# Patient Record
Sex: Male | Born: 1940 | Race: White | Hispanic: No | Marital: Married | Smoking: Former smoker
Health system: Southern US, Community
[De-identification: ages and names within clinical notes are randomized; demographics above are authoritative.]

## PROBLEM LIST (undated history)

## (undated) DIAGNOSIS — E785 Hyperlipidemia, unspecified: Secondary | ICD-10-CM

## (undated) DIAGNOSIS — C4490 Unspecified malignant neoplasm of skin, unspecified: Secondary | ICD-10-CM

## (undated) DIAGNOSIS — H3530 Unspecified macular degeneration: Secondary | ICD-10-CM

## (undated) DIAGNOSIS — I10 Essential (primary) hypertension: Secondary | ICD-10-CM

## (undated) DIAGNOSIS — N400 Enlarged prostate without lower urinary tract symptoms: Secondary | ICD-10-CM

## (undated) DIAGNOSIS — I2510 Atherosclerotic heart disease of native coronary artery without angina pectoris: Secondary | ICD-10-CM

## (undated) DIAGNOSIS — C3490 Malignant neoplasm of unspecified part of unspecified bronchus or lung: Secondary | ICD-10-CM

## (undated) DIAGNOSIS — I6529 Occlusion and stenosis of unspecified carotid artery: Secondary | ICD-10-CM

## (undated) DIAGNOSIS — Z923 Personal history of irradiation: Secondary | ICD-10-CM

## (undated) DIAGNOSIS — K5790 Diverticulosis of intestine, part unspecified, without perforation or abscess without bleeding: Secondary | ICD-10-CM

## (undated) HISTORY — DX: Malignant neoplasm of unspecified part of unspecified bronchus or lung: C34.90

## (undated) HISTORY — DX: Occlusion and stenosis of unspecified carotid artery: I65.29

## (undated) HISTORY — DX: Atherosclerotic heart disease of native coronary artery without angina pectoris: I25.10

## (undated) HISTORY — DX: Essential (primary) hypertension: I10

## (undated) HISTORY — PX: CAROTID ENDARTERECTOMY: SUR193

## (undated) HISTORY — DX: Hyperlipidemia, unspecified: E78.5

## (undated) HISTORY — DX: Unspecified macular degeneration: H35.30

## (undated) HISTORY — DX: Unspecified malignant neoplasm of skin, unspecified: C44.90

## (undated) HISTORY — DX: Benign prostatic hyperplasia without lower urinary tract symptoms: N40.0

## (undated) HISTORY — DX: Diverticulosis of intestine, part unspecified, without perforation or abscess without bleeding: K57.90

## (undated) HISTORY — DX: Personal history of irradiation: Z92.3

---

## 1998-05-04 ENCOUNTER — Ambulatory Visit (HOSPITAL_COMMUNITY): Admission: RE | Admit: 1998-05-04 | Discharge: 1998-05-04 | Payer: Self-pay | Admitting: Urology

## 2004-10-06 ENCOUNTER — Ambulatory Visit: Payer: Self-pay | Admitting: Cardiology

## 2004-10-06 ENCOUNTER — Inpatient Hospital Stay (HOSPITAL_COMMUNITY): Admission: EM | Admit: 2004-10-06 | Discharge: 2004-10-14 | Payer: Self-pay | Admitting: Emergency Medicine

## 2004-10-10 HISTORY — PX: CORONARY ARTERY BYPASS GRAFT: SHX141

## 2004-10-24 ENCOUNTER — Ambulatory Visit: Payer: Self-pay | Admitting: Cardiology

## 2005-01-09 ENCOUNTER — Ambulatory Visit: Payer: Self-pay | Admitting: Cardiology

## 2005-02-25 ENCOUNTER — Ambulatory Visit (HOSPITAL_COMMUNITY): Admission: RE | Admit: 2005-02-25 | Discharge: 2005-02-25 | Payer: Self-pay | Admitting: Gastroenterology

## 2005-04-12 ENCOUNTER — Encounter (INDEPENDENT_AMBULATORY_CARE_PROVIDER_SITE_OTHER): Payer: Self-pay | Admitting: *Deleted

## 2005-04-12 ENCOUNTER — Ambulatory Visit (HOSPITAL_COMMUNITY): Admission: RE | Admit: 2005-04-12 | Discharge: 2005-04-13 | Payer: Self-pay | Admitting: Vascular Surgery

## 2005-11-11 ENCOUNTER — Ambulatory Visit: Payer: Self-pay | Admitting: Cardiology

## 2005-12-12 ENCOUNTER — Ambulatory Visit: Payer: Self-pay

## 2006-04-27 ENCOUNTER — Emergency Department (HOSPITAL_COMMUNITY): Admission: EM | Admit: 2006-04-27 | Discharge: 2006-04-27 | Payer: Self-pay | Admitting: Emergency Medicine

## 2006-11-05 ENCOUNTER — Ambulatory Visit: Payer: Self-pay | Admitting: Cardiology

## 2008-01-20 ENCOUNTER — Ambulatory Visit: Payer: Self-pay | Admitting: Cardiology

## 2008-02-04 ENCOUNTER — Ambulatory Visit: Payer: Self-pay

## 2009-03-17 DIAGNOSIS — E785 Hyperlipidemia, unspecified: Secondary | ICD-10-CM

## 2009-03-17 DIAGNOSIS — I10 Essential (primary) hypertension: Secondary | ICD-10-CM

## 2009-03-17 DIAGNOSIS — I6529 Occlusion and stenosis of unspecified carotid artery: Secondary | ICD-10-CM

## 2009-03-17 DIAGNOSIS — Z9889 Other specified postprocedural states: Secondary | ICD-10-CM | POA: Insufficient documentation

## 2009-03-17 HISTORY — DX: Occlusion and stenosis of unspecified carotid artery: I65.29

## 2009-03-17 HISTORY — DX: Essential (primary) hypertension: I10

## 2009-03-17 HISTORY — DX: Hyperlipidemia, unspecified: E78.5

## 2009-03-21 ENCOUNTER — Encounter: Payer: Self-pay | Admitting: Cardiology

## 2009-03-21 ENCOUNTER — Ambulatory Visit: Payer: Self-pay | Admitting: Cardiology

## 2009-03-21 ENCOUNTER — Ambulatory Visit: Payer: Self-pay

## 2010-03-22 DIAGNOSIS — I2510 Atherosclerotic heart disease of native coronary artery without angina pectoris: Secondary | ICD-10-CM | POA: Insufficient documentation

## 2010-03-22 HISTORY — DX: Atherosclerotic heart disease of native coronary artery without angina pectoris: I25.10

## 2010-03-26 ENCOUNTER — Encounter: Payer: Self-pay | Admitting: Cardiology

## 2010-03-27 ENCOUNTER — Ambulatory Visit: Payer: Self-pay | Admitting: Cardiology

## 2010-05-24 ENCOUNTER — Telehealth (INDEPENDENT_AMBULATORY_CARE_PROVIDER_SITE_OTHER): Payer: Self-pay

## 2010-05-28 ENCOUNTER — Encounter (HOSPITAL_COMMUNITY): Admission: RE | Admit: 2010-05-28 | Discharge: 2010-08-08 | Payer: Self-pay | Admitting: Cardiology

## 2010-05-28 ENCOUNTER — Encounter: Payer: Self-pay | Admitting: Cardiology

## 2010-05-28 ENCOUNTER — Ambulatory Visit: Payer: Self-pay | Admitting: Cardiology

## 2010-05-28 ENCOUNTER — Ambulatory Visit: Payer: Self-pay

## 2011-01-01 NOTE — Assessment & Plan Note (Signed)
Summary: Cardiology Nuclear Study  Nuclear Med Background Indications for Stress Test: Evaluation for Ischemia, Graft Patency   History: CABG, Heart Catheterization, Myocardial Perfusion Study  History Comments: '05 CABG x 5; '09 [id number] inferior wall infarct, no ischemia, EF=60%; s/p (L) CEA  Symptoms: DOE    Nuclear Pre-Procedure Cardiac Risk Factors: Carotid Disease, Family History - CAD, History of Smoking, Hypertension, Lipids, PVD Caffeine/Decaff Intake: None NPO After: 9:00 PM Lungs: Clear.  O2 Sat 99% on RA. IV 0.9% NS with Angio Cath: 22g     IV Site: (R) AC IV Started by: Irean Hong RN Chest Size (in) 42     Height (in): 67 Weight (lb): 178 BMI: 27.98 Tech Comments: Held metoprolol this a.m.  No medications taken this a.m.  Nuclear Med Study 1 or 2 day study:  1 day     Stress Test Type:  Eugenie Birks Reading MD:  Marca Ancona, MD     Referring MD:  Valera Castle, MD Resting Radionuclide:  Technetium 72m Tetrofosmin     Resting Radionuclide Dose:  11.0 mCi  Stress Radionuclide:  Technetium 16m Tetrofosmin     Stress Radionuclide Dose:  33.0 mCi   Stress Protocol   Lexiscan: 0.4 mg   Stress Test Technologist:  Rea College CMA-N     Nuclear Technologist:  Domenic Polite CNMT  Rest Procedure  Myocardial perfusion imaging was performed at rest 45 minutes following the intravenous administration of Myoview Technetium 88m Tetrofosmin.  Stress Procedure  The patient received IV Lexiscan 0.4 mg over 15-seconds.  Myoview injected at 30-seconds.  There were no significant changes with infusion, only occasional PVC's.  Quantitative spect images were obtained after a 45 minute delay.  QPS Raw Data Images:  Normal; no motion artifact; normal heart/lung ratio. Stress Images:  Moderate inferior perfusion defect.  Rest Images:  Inferior perfusion defect, less pronounced than with stress.  Subtraction (SDS):  Partially reversible inferior perfusion defect.  Transient  Ischemic Dilatation:  1.03  (Normal <1.22)  Lung/Heart Ratio:  .33  (Normal <0.45)  Quantitative Gated Spect Images QGS EDV:  110 ml QGS ESV:  42 ml QGS EF:  61 % QGS cine images:  Normal wall motion.    Overall Impression  Exercise Capacity: Lexiscan study.  BP Response: Normal blood pressure response. Clinical Symptoms: None. ECG Impression: No significant ST segment change suggestive of ischemia. Overall Impression: Partially reversible inferior perfusion defect suggestive of infarction with peri-infarction ischemia.  Overall Impression Comments: Prior study reported as fixed inferior defect.  Images are not available to compare.   Appended Document: Cardiology Nuclear Study LOW RISK STUDY. NOTED PT ON 80MG  OF SIMVASTATIN.....SHOULD SWITCH TO LIPITOR 40MG  Q DAY AND RECHECK LIPIDS IN 6 WEEKS.  Appended Document: Cardiology Nuclear Study PT AWARE OF MYOVIEW RESULTS PER PT PMD CUT SIMVASATIN TO 40 MG  IS WANTING TO TRY .

## 2011-01-01 NOTE — Assessment & Plan Note (Signed)
Summary: CAD/ANAS   Visit Type:  Follow-up Primary Provider:  [name] MD  CC:  yearly follow up.  History of Present Illness: Rodney Christensen returns today for evaluation and management of his coronary artery disease, history of prior bypass grafting, and cerebrovascular disease.  He is having no symptoms of angina or ischemia other than some mild dyspnea on exertion. His last stress Myoview was 2 years ago and was stable.  Carotid Dopplers today are stable. He is having no symptoms of TIAs or mini strokes when carefully questioned  Denies orthopnea, PND or peripheral edema. He's had no claudication. He's had no palpitations or syncope.  Current Medications (verified): 1)  Doxazosin Mesylate 8 Mg Tabs (Doxazosin Mesylate) .Marland Kitchen.. 1 Tab Once Daily 2)  Folic Acid 1 Mg Tabs (Folic Acid) .Marland Kitchen.. 1 Tab Once Daily 3)  Metoprolol Tartrate 50 Mg Tabs (Metoprolol Tartrate) .Marland Kitchen.. 1 Tab Two Times A Day 4)  Hydrochlorothiazide 25 Mg Tabs (Hydrochlorothiazide) .Marland Kitchen.. 1 Tab Once Daily 5)  Multivitamins   Tabs (Multiple Vitamin) .Marland Kitchen.. 1 Tab Once Daily 6)  Beta Carotine .... As Directed 7)  B-100 Complex  Tabs (Vitamins-Lipotropics) .... Use As Directed 8)  Vitamin C Cr 500 Mg Cr-Tabs (Ascorbic Acid) .... Use As Directed 9)  Chelated Zinc 50 Mg Tabs (Zinc) .Marland Kitchen.. 1 Tab Once Daily 10)  Cal/mag/zinc .... Use As Directed 11)  Ginkoba 40 Mg Tabs (Ginkgo Biloba) .Marland Kitchen.. 1 Once Daily 12)  Fish Oil 1000 Mg Caps (Omega-3 Fatty Acids) .Marland Kitchen.. 1 Cap Three Times A Day 13)  Melatonin 1 Mg Tabs (Melatonin) .Marland Kitchen.. 1 Tab At Bedtime 14)  Garlic Oil 1000 Mg Caps (Garlic) .Marland Kitchen.. 1 Once Daily 15)  Simvastatin 80 Mg Tabs (Simvastatin) .Marland Kitchen.. 1 Tab At Bedtime 16)  Aspirin 81 Mg Tbec (Aspirin) .... Take One Tablet By Mouth Daily 17)  Lisinopril 40 Mg Tabs (Lisinopril) .Marland Kitchen.. 1 Tab Two Times A Day 18)  Gnp Cinnamon 500 Mg Caps (Cinnamon) .... 2 Once Daily 19)  Bilberry 500 Mg Caps (Bilberry (Vaccinium Myrtillus)) .Marland Kitchen.. 1 Tab Once Daily 20)   Levobanolol Eye Gtts .Marland Kitchen.. 1 Gtt in Left Eye Qam 21)  Lumigan 0.03 % Soln (Bimatoprost) .Marland Kitchen.. 1 Gtt in Each Eye Qpm 22)  Amlodipine Besylate 5 Mg Tabs (Amlodipine Besylate) .Marland Kitchen.. 1 Once Daily 23)  Chromium Picolinate 500 Mcg Tabs (Chromium Picolinate) .Marland Kitchen.. 1 Once Daily 24)  Ocuvite-Lutein  Caps (Multiple Vitamins-Minerals) .Marland Kitchen.. 1 Two Times A Day 25)  Coq10 100 Mg Caps (Coenzyme Q10) .Marland Kitchen.. 1 Once Daily  Allergies (verified): 1)  ! Keflex  Past History:  Past Medical History: Last updated: 03/22/2010 CAD, NATIVE VESSEL (ICD-414.01) CAROTID ARTERY DISEASE (ICD-433.10) CAROTID ENDARTERECTOMY, LEFT, HX OF (ICD-V15.1) HYPERTENSION, UNSPECIFIED (ICD-401.9) HYPERLIPIDEMIA-MIXED (ICD-272.4)  Past Surgical History: Last updated: 03-19-2009 CABG x 5..10/10/04  Family History: Last updated: 03/19/09 Mother: deceased at 54..natural causes Father: deceased at 44..natural causes  Social History: Last updated: 2009-03-19 Tobacco Use - Former.   quit 09/14/1981 Married  Alcohol Use - yes Drug Use - no  Risk Factors: Smoking Status: quit (Mar 19, 2009)  Review of Systems       negative other than history of present illness  Vital Signs:  Patient profile:   70 year old male Height:      67 inches Weight:      177 pounds BMI:     27.82 Pulse rate:   59 / minute BP sitting:   130 / 66  (left arm) Cuff size:   regular  Vitals  Entered By: Hardin Negus, RMA (March 27, 2010 8:47 AM)  Physical Exam  General:  obese.   Head:  normocephalic and atraumatic Eyes:  PERRLA/EOM intact; conjunctiva and lids normal. Neck:  Neck supple, no JVD. No masses, thyromegaly or abnormal cervical nodes. Lungs:  Clear bilaterally to auscultation and percussion. Heart:  PMI nondisplaced, normal S1-S2, no gallop or murmur. Bilateral carotid bruits left greater than right Abdomen:  Bowel sounds positive; abdomen soft and non-tender without masses, organomegaly, or hernias noted. No  hepatosplenomegaly. Msk:  decreased ROM.   Pulses:  diminished but present in the lower extremity Extremities:  No clubbing or cyanosis. Neurologic:  Alert and oriented x 3. Skin:  Intact without lesions or rashes. Psych:  Normal affect.   EKG  Procedure date:  03/27/2010  Findings:      sinus bradycardia, nonspecific ST segment changes, stable  Impression & Recommendations:  Problem # 1:  CAD, NATIVE VESSEL (ICD-414.01) I will obtain a Myoview to determine graft patency. Negative for skin he also him back in a year. Symptoms of acute coronary syndrome and how to respond with nitroglycerin and 911 reviewed with he and his wife. His updated medication list for this problem includes:    Metoprolol Tartrate 50 Mg Tabs (Metoprolol tartrate) .Marland Kitchen... 1 tab two times a day    Aspirin 81 Mg Tbec (Aspirin) .Marland Kitchen... Take one tablet by mouth daily    Lisinopril 40 Mg Tabs (Lisinopril) .Marland Kitchen... 1 tab two times a day    Amlodipine Besylate 5 Mg Tabs (Amlodipine besylate) .Marland Kitchen... 1 once daily  Orders: Nuclear Stress Test (Nuc Stress Test)  Problem # 2:  CAROTID ARTERY DISEASE (ICD-433.10) Assessment: Unchanged Carotid Dopplers showed no change today. Continue current medications. Symptoms of TIAs or stroke reviewed at length and how to respond urgently. His updated medication list for this problem includes:    Aspirin 81 Mg Tbec (Aspirin) .Marland Kitchen... Take one tablet by mouth daily  Orders: EKG w/ Interpretation (93000)  Problem # 3:  CAROTID ENDARTERECTOMY, LEFT, HX OF (ICD-V15.1) Assessment: Unchanged  Problem # 4:  HYPERTENSION, UNSPECIFIED (ICD-401.9)  His updated medication list for this problem includes:    Doxazosin Mesylate 8 Mg Tabs (Doxazosin mesylate) .Marland Kitchen... 1 tab once daily    Metoprolol Tartrate 50 Mg Tabs (Metoprolol tartrate) .Marland Kitchen... 1 tab two times a day    Hydrochlorothiazide 25 Mg Tabs (Hydrochlorothiazide) .Marland Kitchen... 1 tab once daily    Aspirin 81 Mg Tbec (Aspirin) .Marland Kitchen... Take one tablet by  mouth daily    Lisinopril 40 Mg Tabs (Lisinopril) .Marland Kitchen... 1 tab two times a day    Amlodipine Besylate 5 Mg Tabs (Amlodipine besylate) .Marland Kitchen... 1 once daily  Problem # 5:  HYPERLIPIDEMIA-MIXED (ICD-272.4)  His updated medication list for this problem includes:    Simvastatin 80 Mg Tabs (Simvastatin) .Marland Kitchen... 1 tab at bedtime  Patient Instructions: 1)  Your physician recommends that you schedule a follow-up appointment in: 1YEAR WITH DR Sequoyah Ramone 2)  Your physician recommends that you continue on your current medications as directed. Please refer to the Current Medication list given to you today. 3)  Your physician has requested that you have an adenosine myoview.  For further information please visit [email or web address]  Please follow instruction sheet, as given.

## 2011-01-01 NOTE — Miscellaneous (Signed)
Summary: Orders Update  Clinical Lists Changes  Orders: Added new Test order of Carotid Duplex (Carotid Duplex) - Signed 

## 2011-01-01 NOTE — Progress Notes (Signed)
Summary: Nuc. Pre-Procedure  Phone Note Outgoing Call Call back at [facility] Phone [phone]   Call placed by: Irean Hong, RN,  May 24, 2010 2:52 PM Summary of Call: Reviewed information on Myoview Information Sheet (see scanned document for further details).  Spoke with patient.     Nuclear Med Background Indications for Stress Test: Evaluation for Ischemia, Graft Patency   History: CABG, Heart Catheterization, Myocardial Perfusion Study  History Comments: '05 Cath>CABG x5. 3/09 MPS: Mod inferior wall infarct, (-) ischemia, EF=60%.  Symptoms: DOE    Nuclear Pre-Procedure Cardiac Risk Factors: Carotid Disease, History of Smoking, Hypertension, Lipids, PVD Height (in): 67

## 2011-02-07 ENCOUNTER — Encounter: Payer: Self-pay | Admitting: Internal Medicine

## 2011-02-12 ENCOUNTER — Other Ambulatory Visit: Payer: Self-pay | Admitting: Internal Medicine

## 2011-02-12 ENCOUNTER — Encounter (HOSPITAL_BASED_OUTPATIENT_CLINIC_OR_DEPARTMENT_OTHER): Payer: Medicare Other | Admitting: Internal Medicine

## 2011-02-12 DIAGNOSIS — C3490 Malignant neoplasm of unspecified part of unspecified bronchus or lung: Secondary | ICD-10-CM

## 2011-02-12 DIAGNOSIS — Z85118 Personal history of other malignant neoplasm of bronchus and lung: Secondary | ICD-10-CM

## 2011-02-12 LAB — CBC WITH DIFFERENTIAL/PLATELET
BASO%: 0.3 % (ref 0.0–2.0)
Basophils Absolute: 0 10*3/uL (ref 0.0–0.1)
EOS%: 6 % (ref 0.0–7.0)
Eosinophils Absolute: 0.4 10*3/uL (ref 0.0–0.5)
HCT: 34.6 % — ABNORMAL LOW (ref 38.4–49.9)
HGB: 12 g/dL — ABNORMAL LOW (ref 13.0–17.1)
LYMPH%: 19.1 % (ref 14.0–49.0)
MCH: 30.7 pg (ref 27.2–33.4)
MCHC: 34.7 g/dL (ref 32.0–36.0)
MCV: 88.4 fL (ref 79.3–98.0)
MONO#: 0.8 10*3/uL (ref 0.1–0.9)
MONO%: 10.9 % (ref 0.0–14.0)
NEUT#: 4.7 10*3/uL (ref 1.5–6.5)
NEUT%: 63.7 % (ref 39.0–75.0)
Platelets: 170 10*3/uL (ref 140–400)
RBC: 3.91 10*6/uL — ABNORMAL LOW (ref 4.20–5.82)
RDW: 12.9 % (ref 11.0–14.6)
WBC: 7.4 10*3/uL (ref 4.0–10.3)
lymph#: 1.4 10*3/uL (ref 0.9–3.3)

## 2011-02-12 LAB — COMPREHENSIVE METABOLIC PANEL
ALT: 17 U/L (ref 0–53)
AST: 18 U/L (ref 0–37)
Albumin: 4.2 g/dL (ref 3.5–5.2)
Alkaline Phosphatase: 75 U/L (ref 39–117)
BUN: 10 mg/dL (ref 6–23)
CO2: 27 mEq/L (ref 19–32)
Calcium: 9.2 mg/dL (ref 8.4–10.5)
Chloride: 99 mEq/L (ref 96–112)
Creatinine, Ser: 0.78 mg/dL (ref 0.40–1.50)
Glucose, Bld: 108 mg/dL — ABNORMAL HIGH (ref 70–99)
Potassium: 3.5 mEq/L (ref 3.5–5.3)
Sodium: 138 mEq/L (ref 135–145)
Total Bilirubin: 0.6 mg/dL (ref 0.3–1.2)
Total Protein: 6.7 g/dL (ref 6.0–8.3)

## 2011-02-15 ENCOUNTER — Ambulatory Visit (HOSPITAL_COMMUNITY)
Admission: RE | Admit: 2011-02-15 | Discharge: 2011-02-15 | Disposition: A | Discharge status: Home / Self Care | Payer: Medicare Other | Source: Ambulatory Visit | Attending: Internal Medicine | Admitting: Internal Medicine

## 2011-02-15 DIAGNOSIS — C3490 Malignant neoplasm of unspecified part of unspecified bronchus or lung: Secondary | ICD-10-CM | POA: Insufficient documentation

## 2011-02-15 DIAGNOSIS — Z85118 Personal history of other malignant neoplasm of bronchus and lung: Secondary | ICD-10-CM

## 2011-02-15 MED ORDER — GADOBENATE DIMEGLUMINE 529 MG/ML IV SOLN
17.0000 mL | Freq: Once | INTRAVENOUS | Status: AC | PRN
  Administered 2011-02-15: 17 mL via INTRAVENOUS

## 2011-02-18 ENCOUNTER — Encounter (HOSPITAL_COMMUNITY): Payer: Self-pay

## 2011-02-18 ENCOUNTER — Encounter (HOSPITAL_COMMUNITY)
Admission: RE | Admit: 2011-02-18 | Discharge: 2011-02-18 | Disposition: A | Discharge status: Home / Self Care | Payer: Medicare Other | Source: Ambulatory Visit | Attending: Internal Medicine | Admitting: Internal Medicine

## 2011-02-18 DIAGNOSIS — Z85118 Personal history of other malignant neoplasm of bronchus and lung: Secondary | ICD-10-CM

## 2011-02-18 LAB — GLUCOSE, CAPILLARY: Glucose-Capillary: 102 mg/dL — ABNORMAL HIGH (ref 70–99)

## 2011-02-18 MED ORDER — FLUDEOXYGLUCOSE F - 18 (FDG) INJECTION
17.0000 | Freq: Once | INTRAVENOUS | Status: AC | PRN
  Administered 2011-02-18: 17 via INTRAVENOUS

## 2011-02-19 ENCOUNTER — Other Ambulatory Visit (HOSPITAL_COMMUNITY): Payer: Medicare Other

## 2011-02-19 ENCOUNTER — Encounter (HOSPITAL_BASED_OUTPATIENT_CLINIC_OR_DEPARTMENT_OTHER): Payer: Medicare Other | Admitting: Internal Medicine

## 2011-02-19 DIAGNOSIS — C3490 Malignant neoplasm of unspecified part of unspecified bronchus or lung: Secondary | ICD-10-CM

## 2011-02-25 ENCOUNTER — Encounter (INDEPENDENT_AMBULATORY_CARE_PROVIDER_SITE_OTHER): Payer: Medicare Other | Admitting: Thoracic Surgery (Cardiothoracic Vascular Surgery)

## 2011-02-25 DIAGNOSIS — D381 Neoplasm of uncertain behavior of trachea, bronchus and lung: Secondary | ICD-10-CM

## 2011-02-26 ENCOUNTER — Ambulatory Visit (HOSPITAL_COMMUNITY)
Admission: RE | Admit: 2011-02-26 | Discharge: 2011-02-26 | Disposition: A | Discharge status: Home / Self Care | Payer: Medicare Other | Source: Ambulatory Visit | Attending: Thoracic Surgery (Cardiothoracic Vascular Surgery) | Admitting: Thoracic Surgery (Cardiothoracic Vascular Surgery)

## 2011-02-26 DIAGNOSIS — J984 Other disorders of lung: Secondary | ICD-10-CM | POA: Insufficient documentation

## 2011-02-26 LAB — BLOOD GAS, ARTERIAL
Acid-Base Excess: 2.8 mmol/L — ABNORMAL HIGH (ref 0.0–2.0)
Bicarbonate: 26.4 mEq/L — ABNORMAL HIGH (ref 20.0–24.0)
Drawn by: 122601
FIO2: 0.21 %
O2 Saturation: 96.1 %
Patient temperature: 98.6
TCO2: 27.6 mmol/L (ref 0–100)
pCO2 arterial: 37.5 mmHg (ref 35.0–45.0)
pH, Arterial: 7.462 — ABNORMAL HIGH (ref 7.350–7.450)
pO2, Arterial: 76.6 mmHg — ABNORMAL LOW (ref 80.0–100.0)

## 2011-02-26 NOTE — Consult Note (Signed)
NEW PATIENT CONSULTATION  APOLLOS, TENBRINK DOB:  23-Oct-1941                                        February 25, 2011 CHART #:  [id number]  The patient is a 70 year old gentleman with a history of tobacco abuse who has had coronary artery disease and coronary bypass grafting x5 in 2005.  He also has a history of hypertension, dyslipidemia, melanoma, and macular degeneration.  He had been doing fairly well until December of last year when he got a cold that he never could quite shake.  It was characterized by cough, not really productive of sputum.  He denied any wheezing, but he felt that he would get tired more easily and he would have to breath hard when he exerted himself.  His driveway is on a slight incline and he would walk to check the mail, it is about 50 feet down and 50 feet back and on the way back up the hill, which he says is not particularly steep, he would be breathing hard.  Also, if he walks up a flight of stairs, he would be breathing hard.  He then had one episode of hemoptysis and this led to a chest x-ray and then subsequently had CT of the chest, which was done at [facility], which showed a left lower lobe mass.  He was referred to Dr. Arbutus Ped.  PET scan was done.  It likewise showed a left lower lobe mass with postobstructive atelectasis.  The mass itself was hard to identify in terms of size due to the extensive atelectasis beyond it.  The SUV max was 22.6.  There was no adenopathy or distant metastases noted.  MRI of the brain likewise revealed no metastases.  PAST MEDICAL HISTORY:  Significant for coronary artery disease status post coronary bypass grafting in 2005, hypertension, hyperlipidemia, tobacco abuse quit in 1982, history of melanoma of the nose, benign prostatic hypertrophy, macular degeneration, diverticulosis.  CURRENT MEDICATIONS:  Lumigan 0.01% 1 drop OU daily, Demadex 20 mg daily, Norvasc 5 mg daily, Lopressor 50 mg b.i.d., Cardura 8  mg daily, potassium chloride 20 mEq daily, Zocor 20 mg daily, folic acid 1 mg daily, Betagan 0.5% 1 drop OU every morning, lisinopril 40 mg p.o. b.i.d.  ALLERGIES:  He has an allergy to [facility].  FAMILY HISTORY:  Significant for cardiovascular disease.  SOCIAL HISTORY:  He is married.  He has 2 grown children.  He lives with his wife.  He is retired.  He has smoked as much as 2-1/2 packs a day for about 25 years, he quit in 1982.  Occasional alcohol use.  REVIEW OF SYSTEMS:  Breathing hard as noted.  He circled shortness of breath with exertion on the patient information sheet, but he does not consider it shortness of breath, just that he is breathing hard and also the one episode of hemoptysis as well as a nonproductive cough, which has been persistent.  He tires easily with exertion as he noted.  He has not had any anginal-type chest pain.  No headaches or visual changes. He does have chronic visual changes secondary to macular degeneration but no acute visual changes.  His weight has been stable.  LABORATORY DATA:  CT scans from [facility] and PET scan were reviewed that shows a large relatively central lung mass with basically total occlusion of the left lower lobe bronchus and  the interval between the head in the CT scan at [facility] and the PET scan, there has been more extensive left lower lobe atelectasis, now essentially the lower lobe is completely down behind the mass.  The mass does come very close to the origin of the left lower lobe bronchus.  There is some encasement of the pulmonary artery to the left lower lobe.  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  He has not had a diagnosis yet, we need to establish a diagnosis.  He also needs pulmonary function testing to assess  his pulmonary reserve.  I have serious doubt that this would be resectable with a lobectomy.  I think it is much more likely that we require pneumonectomy, I am not sure he is a candidate for that procedure.  We will go ahead and schedule him for a bronchoscopy with biopsies on Thursday, March 29, that will be done as an outpatient procedure and schedule the pulmonary function testing with and without bronchodilators and with the room air blood gas as soon as we can possibly do so and then once we have that information, we can reassess whether he might be a potential candidate for surgical resection.  I do think he would need to see Dr. Daleen Squibb for cardiac clearance prior to undergoing any type of surgical resection given that he had pretty diffusely diseased coronaries at the time of his bypass surgery 7 years ago.  Salvatore Decent Dorris Fetch, M.D. Electronically Signed  SCH/MEDQ  D:  02/25/2011  T:  02/26/2011  Job:  [id number]  cc:   Mohamed K. Arbutus Ped, M.D. [name], MD Jesse Sans. Wall, MD, [facility]

## 2011-02-27 ENCOUNTER — Encounter (HOSPITAL_COMMUNITY)
Admission: RE | Admit: 2011-02-27 | Discharge: 2011-02-27 | Disposition: A | Discharge status: Home / Self Care | Payer: Medicare Other | Source: Ambulatory Visit | Attending: Thoracic Surgery (Cardiothoracic Vascular Surgery) | Admitting: Thoracic Surgery (Cardiothoracic Vascular Surgery)

## 2011-02-27 ENCOUNTER — Other Ambulatory Visit: Payer: Self-pay | Admitting: Thoracic Surgery (Cardiothoracic Vascular Surgery)

## 2011-02-27 DIAGNOSIS — Z01810 Encounter for preprocedural cardiovascular examination: Secondary | ICD-10-CM | POA: Insufficient documentation

## 2011-02-27 DIAGNOSIS — Z01812 Encounter for preprocedural laboratory examination: Secondary | ICD-10-CM | POA: Insufficient documentation

## 2011-02-27 DIAGNOSIS — Z01818 Encounter for other preprocedural examination: Secondary | ICD-10-CM | POA: Insufficient documentation

## 2011-02-27 DIAGNOSIS — R918 Other nonspecific abnormal finding of lung field: Secondary | ICD-10-CM

## 2011-02-27 LAB — COMPREHENSIVE METABOLIC PANEL
ALT: 18 U/L (ref 0–53)
AST: 23 U/L (ref 0–37)
Alkaline Phosphatase: 81 U/L (ref 39–117)
CO2: 31 mEq/L (ref 19–32)
Calcium: 9.8 mg/dL (ref 8.4–10.5)
Chloride: 95 mEq/L — ABNORMAL LOW (ref 96–112)
Creatinine, Ser: 0.79 mg/dL (ref 0.4–1.5)
GFR calc Af Amer: >60 mL/min (ref >60)
GFR calc non Af Amer: >60 mL/min (ref >60)
Glucose, Bld: 82 mg/dL (ref 70–99)
Potassium: 3.9 mEq/L (ref 3.5–5.1)
Sodium: 138 mEq/L (ref 135–145)
Total Protein: 7.1 g/dL (ref 6.0–8.3)

## 2011-02-27 LAB — CBC
HCT: 36.9 % — ABNORMAL LOW (ref 39.0–52.0)
Hemoglobin: 12.9 g/dL — ABNORMAL LOW (ref 13.0–17.0)
MCH: 30 pg (ref 26.0–34.0)
MCHC: 35 g/dL (ref 30.0–36.0)
MCV: 85.8 fL (ref 78.0–100.0)
Platelets: 173 10*3/uL (ref 150–400)
RBC: 4.3 MIL/uL (ref 4.22–5.81)
RDW: 12.5 % (ref 11.5–15.5)
WBC: 7.9 10*3/uL (ref 4.0–10.5)

## 2011-02-27 LAB — PROTIME-INR
INR: 1.04 (ref 0.00–1.49)
Prothrombin Time: 13.8 seconds (ref 11.6–15.2)

## 2011-02-27 LAB — SURGICAL PCR SCREEN
MRSA, PCR: NEGATIVE
Staphylococcus aureus: POSITIVE — AB

## 2011-02-27 LAB — APTT: aPTT: 31 seconds (ref 24–37)

## 2011-02-28 ENCOUNTER — Other Ambulatory Visit: Payer: Self-pay | Admitting: Thoracic Surgery (Cardiothoracic Vascular Surgery)

## 2011-02-28 ENCOUNTER — Ambulatory Visit (HOSPITAL_COMMUNITY)
Admission: RE | Admit: 2011-02-28 | Discharge: 2011-02-28 | Disposition: A | Discharge status: Home / Self Care | Payer: Medicare Other | Source: Ambulatory Visit | Attending: Thoracic Surgery (Cardiothoracic Vascular Surgery) | Admitting: Thoracic Surgery (Cardiothoracic Vascular Surgery)

## 2011-02-28 ENCOUNTER — Ambulatory Visit (HOSPITAL_COMMUNITY): Payer: Medicare Other

## 2011-02-28 DIAGNOSIS — I10 Essential (primary) hypertension: Secondary | ICD-10-CM | POA: Insufficient documentation

## 2011-02-28 DIAGNOSIS — Z87891 Personal history of nicotine dependence: Secondary | ICD-10-CM | POA: Insufficient documentation

## 2011-02-28 DIAGNOSIS — Z01818 Encounter for other preprocedural examination: Secondary | ICD-10-CM | POA: Insufficient documentation

## 2011-02-28 DIAGNOSIS — I2510 Atherosclerotic heart disease of native coronary artery without angina pectoris: Secondary | ICD-10-CM | POA: Insufficient documentation

## 2011-02-28 DIAGNOSIS — Z951 Presence of aortocoronary bypass graft: Secondary | ICD-10-CM | POA: Insufficient documentation

## 2011-02-28 DIAGNOSIS — H409 Unspecified glaucoma: Secondary | ICD-10-CM | POA: Insufficient documentation

## 2011-02-28 DIAGNOSIS — D381 Neoplasm of uncertain behavior of trachea, bronchus and lung: Secondary | ICD-10-CM

## 2011-02-28 DIAGNOSIS — C3430 Malignant neoplasm of lower lobe, unspecified bronchus or lung: Secondary | ICD-10-CM | POA: Insufficient documentation

## 2011-03-04 ENCOUNTER — Encounter (INDEPENDENT_AMBULATORY_CARE_PROVIDER_SITE_OTHER): Payer: Medicare Other | Admitting: Thoracic Surgery (Cardiothoracic Vascular Surgery)

## 2011-03-04 DIAGNOSIS — C3490 Malignant neoplasm of unspecified part of unspecified bronchus or lung: Secondary | ICD-10-CM

## 2011-03-06 NOTE — Op Note (Signed)
  NAMEKHALIFA, KNECHT NO.:  [id number]  MEDICAL RECORD NO.:  [id number]           PATIENT TYPE:  O  LOCATION:  [facility]                         FACILITY:  [facility]  PHYSICIAN:  Salvatore Decent. Dorris Fetch, M.D.DATE OF BIRTH:  [birth year]  DATE OF PROCEDURE:  02/28/2011 DATE OF DISCHARGE:  02/28/2011                              OPERATIVE REPORT   PREOPERATIVE DIAGNOSIS:  Left lower lobe mass.  POSTOPERATIVE DIAGNOSIS:  Left lower lobe mass.  PROCEDURE:  Flexible fiberoptic bronchoscopy with biopsies and washings.  SURGEON:  Salvatore Decent. Dorris Fetch, MD  ANESTHESIA:  General.  FINDINGS:  Large endobronchial mass occupying the origins of the segmental bronchi of the left lower lobe approximately 2 cm from the left main stem carina.  Biopsies and washings were sent to Pathology.  CLINICAL NOTE:  Mr. Sheley is a 70 year old gentleman with a history of tobacco use.  He was doing well until December of last year when he developed a cold with a cough and began to get tired more easily.  A chest x-ray done after he had an episode of hemoptysis showed left lower lobe mass.  CT was done as well.  This was followed by a PET scan which showed marked hypermetabolic uptake in the mass with an SUV of 22.6. The patient had no evidence of other distant metastases.  He was advised to undergo flexible fiberoptic bronchoscopy to assess the mass, to establish a diagnosis and determine possible resectability.  OPERATIVE NOTE:  Mr Thumm was brought to the preop holding area on February 28, 2011.  There Anesthesia Service established intravenous access.  He was taken to the operating room, anesthetized and intubated.  PAS hoses were placed for DVT prophylaxis.  Flexible fiberoptic bronchoscopy was carried out via the endotracheal tube.  There was normal endobronchial anatomy and no endobronchial lesions on the right side.  The left mainstem was normal as were the left upper lobe bronchi.  The left  lower lobe segmental bronchi were totally obstructed at the origin by a polypoid tumor mass.  This was approximately 2 cm from the left main stem carina.  The left main stem proximal to the segmental bronchi appeared normal.  Multiple biopsies were taken of the lesion, it was friable and did bleed easily.  It was copiously irrigated with saline until the majority of the bleeding had stopped.  Biopsies were sent for permanent pathology. Surveillance biopsies were taken at the carina of the left main stem as well as at the origin of the left lower lobe bronchus opposite to the carina.  These were sent for permanent sections as well.  Washings were also sent for cytology.  The bronchoscope was withdrawn.  The patient was extubated in the operating room and taken to the Postanesthetic Care Unit in good condition.     Salvatore Decent Dorris Fetch, M.D.     SCH/MEDQ  D:  02/28/2011  T:  03/01/2011  Job:  [id number]  cc:   Mohamed K. Arbutus Ped, M.D. Dr. Anise Salvo. Daleen Squibb, MD, [facility]  Electronically Signed by Charlett Lango M.D. on 03/06/2011 11:57:06 AM

## 2011-03-07 ENCOUNTER — Encounter (HOSPITAL_BASED_OUTPATIENT_CLINIC_OR_DEPARTMENT_OTHER): Payer: Medicare Other | Admitting: Internal Medicine

## 2011-03-07 DIAGNOSIS — C3490 Malignant neoplasm of unspecified part of unspecified bronchus or lung: Secondary | ICD-10-CM

## 2011-03-08 ENCOUNTER — Encounter: Payer: Self-pay | Admitting: Cardiology

## 2011-03-13 ENCOUNTER — Ambulatory Visit: Payer: Medicare Other | Attending: Radiation Oncology | Admitting: Radiation Oncology

## 2011-03-13 DIAGNOSIS — Z510 Encounter for antineoplastic radiation therapy: Secondary | ICD-10-CM | POA: Insufficient documentation

## 2011-03-13 DIAGNOSIS — C3490 Malignant neoplasm of unspecified part of unspecified bronchus or lung: Secondary | ICD-10-CM | POA: Insufficient documentation

## 2011-03-14 ENCOUNTER — Encounter: Payer: Self-pay | Admitting: Cardiology

## 2011-03-14 ENCOUNTER — Ambulatory Visit (INDEPENDENT_AMBULATORY_CARE_PROVIDER_SITE_OTHER): Payer: Medicare Other | Admitting: Cardiology

## 2011-03-14 VITALS — BP 126/78 | HR 78 | Resp 18 | Ht 66.0 in | Wt 186.8 lb

## 2011-03-14 DIAGNOSIS — I6529 Occlusion and stenosis of unspecified carotid artery: Secondary | ICD-10-CM

## 2011-03-14 DIAGNOSIS — I2510 Atherosclerotic heart disease of native coronary artery without angina pectoris: Secondary | ICD-10-CM

## 2011-03-14 DIAGNOSIS — Z01810 Encounter for preprocedural cardiovascular examination: Secondary | ICD-10-CM

## 2011-03-14 NOTE — Progress Notes (Signed)
   Patient ID: Rodney Christensen, male    DOB: February 04, 1941, 70 y.o.   MRN: [id number]  HPI Rodney Christensen comes into for pre-op clearance for resection of a LLL lung cancer. He presented with hemoptysis and cough in Feb. I have reviewed his workup and their is no sign of metastatic disease by PET and MRI scanning. He has obstruction of the LLL and a drowned lung on last CXR. He will have concomitant radiation and chemotherapy and possible surgery after that. Oncologist is Therapist, occupational, Careers adviser is [employer].  He has more SOB but no angina or symptoms he had on initial cardiac presentation.  EKG on 02/27/11 is without change since previous EKG's.    Review of Systems  Constitutional: Negative for fever, activity change and appetite change.  HENT: Negative.   Eyes: Negative.   Respiratory: Positive for cough and shortness of breath. Negative for choking, chest tightness, wheezing and stridor.   Cardiovascular: Negative for chest pain, palpitations and leg swelling.  Musculoskeletal: Negative.   Neurological: Negative for dizziness, seizures, syncope, weakness, light-headedness and headaches.  Psychiatric/Behavioral: Negative for behavioral problems, confusion and agitation.      Physical Exam  Constitutional: He is oriented to person, place, and time. He appears well-developed and well-nourished. No distress.  HENT:  Head: Normocephalic and atraumatic.  Eyes: Pupils are equal, round, and reactive to light.       Disconjugate gaze, no change  Pulmonary/Chest: Effort normal.       Decreased BS in left lower lobe with rhonchi  Abdominal: Soft. Bowel sounds are normal. He exhibits distension.  Musculoskeletal: He exhibits no edema.  Neurological: He is alert and oriented to person, place, and time.  Skin: Skin is warm and dry. He is not diaphoretic.  Psychiatric: He has a normal mood and affect.

## 2011-03-14 NOTE — Assessment & Plan Note (Signed)
Stable and cleared for all treatments including surgery. Long discussion with patient and wife. Will be available for in hospital consultation if needed.

## 2011-03-14 NOTE — Patient Instructions (Signed)
Your physician wants you to follow-up in: 1 YEAR WITH DR. WALL. You will receive a reminder letter in the mail two months in advance. If you don't receive a letter, please call our office to schedule the follow-up appointment.   Your physician recommends that you continue on your current medications as directed. Please refer to the Current Medication list given to you today.  

## 2011-03-15 NOTE — Assessment & Plan Note (Signed)
OFFICE VISIT  NEWT, LEVINGSTON DOB:  [birth year]                                        March 14, 2011 CHART #:  [id number]  The patient is a 70 year old gentleman who had coronary bypass grafting x 5 in 2005.  He does have a history of tobacco abuse.  I saw him in the office on February 25, 2011.  At that time, he presented with complaints of wheezing and shortness of breath with exertion.  He also has an episode of hemoptysis.  Chest x-ray and CT scan which were done [facility] showed a left lower lobe mass.  He subsequently was referred to Dr. Arbutus Ped.  A PET scan showed a left lower lobe mass with postobstructive atelectasis, the SUVmax was 22.6.  He had no evidence of metastatic disease.  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  The tumor was originating at the level of the segmental bronchi of the left lower lobe.  Surveillance biopsies were taken at the origin of the left lower lobe as well as the carina between the upper and lower lobes, those were negative.  The patient returns today, discussed the pathologic findings and additional plan. She states that he has a little bit of a sore throat since his biopsy, but no other complaints.  He did have a little hemoptysis first day, none since.  In reviewing the patient's films, taking the consideration of his age and coronary artery disease, he really is not a good candidate for a pneumonectomy.  Based on the location of this tumor, I think it would be a very difficult to get an adequate margins with a lobectomy.  I do think that if we could shrink tumor somewhat with chemo and radiation, he would potentially be a candidate for a lobectomy after neoadjuvant treatment.  We did discuss the possibility of proceeding with surgery at this point in time but that there is a high likelihood of not being able to resect due to the size of the mass in its  proximity to the hilum.  I did discuss that an incomplete resection really is no better than no resection.  I advised the patient that we still need to take care of a couple of issues, one he would definitely need cardiology clearance prior to any surgical procedure.  He has not seen Dr. Daleen Squibb for about a year.  He is not having any definite anginal-type symptoms although he is so short of breath with exertion from his lung mass that it is really hard to tell if he would have angina or not and given that he had diffuse coronary disease 6 years ago at the time of his bypass surgery, I think he had very minimum needs some type of stress testing or he may need cardiac catheterization.  His pulmonary function testing is adequate to tolerate a lobectomy and that itself actually would likely tolerate a pneumonectomy when we take consideration the amount of the left lower lobe is already atelectatic but from a cardiac standpoint, I think it would be very difficult for him to tolerate that operation.  I want to have him come to the [facility] on Thursday to discuss with Dr. Arbutus Ped of Oncology as well as radiation oncologist the possibility of treating him with chemo and radiation  to see if we can get some tumor shrinkage followed by lobectomy and they will make their final decision in terms of treatment at that point in time.  Salvatore Decent Dorris Fetch, M.D. Electronically Signed  SCH/MEDQ  D:  03/14/2011  T:  03/15/2011  Job:  [id number]  cc:   Dr. Anise Salvo. Daleen Squibb, MD, [facility] Lajuana Matte, M.D.

## 2011-03-18 ENCOUNTER — Encounter (HOSPITAL_BASED_OUTPATIENT_CLINIC_OR_DEPARTMENT_OTHER): Payer: Medicare Other | Admitting: Internal Medicine

## 2011-03-18 ENCOUNTER — Other Ambulatory Visit: Payer: Self-pay | Admitting: Internal Medicine

## 2011-03-18 DIAGNOSIS — Z5111 Encounter for antineoplastic chemotherapy: Secondary | ICD-10-CM

## 2011-03-18 DIAGNOSIS — C3490 Malignant neoplasm of unspecified part of unspecified bronchus or lung: Secondary | ICD-10-CM

## 2011-03-18 LAB — COMPREHENSIVE METABOLIC PANEL
AST: 18 U/L (ref 0–37)
Alkaline Phosphatase: 76 U/L (ref 39–117)
BUN: 11 mg/dL (ref 6–23)
Calcium: 9.2 mg/dL (ref 8.4–10.5)
Chloride: 101 mEq/L (ref 96–112)
Creatinine, Ser: 0.69 mg/dL (ref 0.40–1.50)
Total Bilirubin: 0.6 mg/dL (ref 0.3–1.2)

## 2011-03-18 LAB — CBC WITH DIFFERENTIAL/PLATELET
BASO%: 0.7 % (ref 0.0–2.0)
EOS%: 4.7 % (ref 0.0–7.0)
HCT: 35 % — ABNORMAL LOW (ref 38.4–49.9)
LYMPH%: 22.2 % (ref 14.0–49.0)
MCH: 29.8 pg (ref 27.2–33.4)
MCHC: 34.3 g/dL (ref 32.0–36.0)
MCV: 86.8 fL (ref 79.3–98.0)
MONO%: 8.1 % (ref 0.0–14.0)
NEUT%: 64.3 % (ref 39.0–75.0)
Platelets: 156 10*3/uL (ref 140–400)
RBC: 4.03 10*6/uL — ABNORMAL LOW (ref 4.20–5.82)
nRBC: 0 % (ref 0–0)

## 2011-03-25 ENCOUNTER — Other Ambulatory Visit: Payer: Self-pay | Admitting: Internal Medicine

## 2011-03-25 ENCOUNTER — Encounter (HOSPITAL_BASED_OUTPATIENT_CLINIC_OR_DEPARTMENT_OTHER): Payer: Medicare Other | Admitting: Internal Medicine

## 2011-03-25 DIAGNOSIS — Z5111 Encounter for antineoplastic chemotherapy: Secondary | ICD-10-CM

## 2011-03-25 DIAGNOSIS — C3490 Malignant neoplasm of unspecified part of unspecified bronchus or lung: Secondary | ICD-10-CM

## 2011-03-25 LAB — CBC WITH DIFFERENTIAL/PLATELET
Basophils Absolute: 0 10*3/uL (ref 0.0–0.1)
EOS%: 5 % (ref 0.0–7.0)
HCT: 33 % — ABNORMAL LOW (ref 38.4–49.9)
HGB: 11.4 g/dL — ABNORMAL LOW (ref 13.0–17.1)
LYMPH%: 21.6 % (ref 14.0–49.0)
MCH: 29.7 pg (ref 27.2–33.4)
NEUT%: 64.5 % (ref 39.0–75.0)
Platelets: 148 10*3/uL (ref 140–400)
lymph#: 1.4 10*3/uL (ref 0.9–3.3)

## 2011-03-25 LAB — COMPREHENSIVE METABOLIC PANEL
ALT: 16 U/L (ref 0–53)
CO2: 28 mEq/L (ref 19–32)
Calcium: 8.8 mg/dL (ref 8.4–10.5)
Chloride: 100 mEq/L (ref 96–112)
Glucose, Bld: 103 mg/dL — ABNORMAL HIGH (ref 70–99)
Sodium: 136 mEq/L (ref 135–145)
Total Protein: 6.2 g/dL (ref 6.0–8.3)

## 2011-04-01 ENCOUNTER — Other Ambulatory Visit: Payer: Self-pay | Admitting: Internal Medicine

## 2011-04-01 ENCOUNTER — Encounter (HOSPITAL_BASED_OUTPATIENT_CLINIC_OR_DEPARTMENT_OTHER): Payer: Medicare Other | Admitting: Internal Medicine

## 2011-04-01 DIAGNOSIS — C3490 Malignant neoplasm of unspecified part of unspecified bronchus or lung: Secondary | ICD-10-CM

## 2011-04-01 DIAGNOSIS — Z5111 Encounter for antineoplastic chemotherapy: Secondary | ICD-10-CM

## 2011-04-01 LAB — COMPREHENSIVE METABOLIC PANEL
ALT: 14 U/L (ref 0–53)
Albumin: 4.1 g/dL (ref 3.5–5.2)
CO2: 24 mEq/L (ref 19–32)
Calcium: 9.4 mg/dL (ref 8.4–10.5)
Chloride: 99 mEq/L (ref 96–112)
Potassium: 4 mEq/L (ref 3.5–5.3)
Sodium: 134 mEq/L — ABNORMAL LOW (ref 135–145)
Total Bilirubin: 0.5 mg/dL (ref 0.3–1.2)
Total Protein: 6.7 g/dL (ref 6.0–8.3)

## 2011-04-01 LAB — CBC WITH DIFFERENTIAL/PLATELET
BASO%: 0.3 % (ref 0.0–2.0)
Eosinophils Absolute: 0.2 10*3/uL (ref 0.0–0.5)
MCHC: 35.1 g/dL (ref 32.0–36.0)
MONO#: 0.4 10*3/uL (ref 0.1–0.9)
NEUT#: 2.6 10*3/uL (ref 1.5–6.5)
RBC: 3.82 10*6/uL — ABNORMAL LOW (ref 4.20–5.82)
RDW: 12.3 % (ref 11.0–14.6)
WBC: 3.9 10*3/uL — ABNORMAL LOW (ref 4.0–10.3)
lymph#: 0.6 10*3/uL — ABNORMAL LOW (ref 0.9–3.3)

## 2011-04-08 ENCOUNTER — Other Ambulatory Visit: Payer: Self-pay | Admitting: Internal Medicine

## 2011-04-08 ENCOUNTER — Encounter (HOSPITAL_BASED_OUTPATIENT_CLINIC_OR_DEPARTMENT_OTHER): Payer: Medicare Other | Admitting: Internal Medicine

## 2011-04-08 DIAGNOSIS — C3490 Malignant neoplasm of unspecified part of unspecified bronchus or lung: Secondary | ICD-10-CM

## 2011-04-08 DIAGNOSIS — C3430 Malignant neoplasm of lower lobe, unspecified bronchus or lung: Secondary | ICD-10-CM

## 2011-04-08 DIAGNOSIS — Z5111 Encounter for antineoplastic chemotherapy: Secondary | ICD-10-CM

## 2011-04-08 LAB — COMPREHENSIVE METABOLIC PANEL
ALT: 16 U/L (ref 0–53)
Alkaline Phosphatase: 72 U/L (ref 39–117)
CO2: 26 mEq/L (ref 19–32)
Potassium: 3.9 mEq/L (ref 3.5–5.3)
Sodium: 134 mEq/L — ABNORMAL LOW (ref 135–145)
Total Bilirubin: 0.5 mg/dL (ref 0.3–1.2)
Total Protein: 6.8 g/dL (ref 6.0–8.3)

## 2011-04-08 LAB — CBC WITH DIFFERENTIAL/PLATELET
BASO%: 0.5 % (ref 0.0–2.0)
LYMPH%: 17.8 % (ref 14.0–49.0)
MCHC: 35 g/dL (ref 32.0–36.0)
MONO#: 0.4 10*3/uL (ref 0.1–0.9)
Platelets: 103 10*3/uL — ABNORMAL LOW (ref 140–400)
RBC: 3.86 10*6/uL — ABNORMAL LOW (ref 4.20–5.82)
RDW: 12.6 % (ref 11.0–14.6)
WBC: 3.8 10*3/uL — ABNORMAL LOW (ref 4.0–10.3)

## 2011-04-15 ENCOUNTER — Encounter (HOSPITAL_BASED_OUTPATIENT_CLINIC_OR_DEPARTMENT_OTHER): Payer: Medicare Other | Admitting: Internal Medicine

## 2011-04-15 ENCOUNTER — Other Ambulatory Visit: Payer: Self-pay | Admitting: Internal Medicine

## 2011-04-15 DIAGNOSIS — C3490 Malignant neoplasm of unspecified part of unspecified bronchus or lung: Secondary | ICD-10-CM

## 2011-04-15 LAB — CBC WITH DIFFERENTIAL/PLATELET
BASO%: 0.5 % (ref 0.0–2.0)
EOS%: 2.4 % (ref 0.0–7.0)
LYMPH%: 14.3 % (ref 14.0–49.0)
MCHC: 34.8 g/dL (ref 32.0–36.0)
MCV: 85.3 fL (ref 79.3–98.0)
MONO%: 8.2 % (ref 0.0–14.0)
Platelets: 72 10*3/uL — ABNORMAL LOW (ref 140–400)
RBC: 3.74 10*6/uL — ABNORMAL LOW (ref 4.20–5.82)
WBC: 3.8 10*3/uL — ABNORMAL LOW (ref 4.0–10.3)

## 2011-04-16 NOTE — Assessment & Plan Note (Signed)
[facility]                            CARDIOLOGY OFFICE NOTE   NAME:EGANBenn, Tarver                           MRN:          [id number]  DATE:01/20/2008                            DOB:          01-Jun-1941    Mr. Bua returns today for further management of the following issues.  1. Coronary artery disease.  He is having no symptoms of angina or      ischemia.  His last stress Myoview was December 12, 2005.  This      showed no ischemia.  Ejection fraction 55%.  He has had previous      coronary bypass surgery.  2. Carotid artery disease, status post left carotid endarterectomy.      He is due for carotid Dopplers.  3. Hyperlipidemia.  4. Hypertension.  His labs are being followed by Dr. [name] in      [place].   CURRENT MEDICATIONS:  1. Doxazosin 8 mg a day.  2. Folic acid 1 mg a day.  3. Metoprolol 50 mg p.o. b.i.d.  4. Lisinopril 40 mg b.i.d.  5. HCTZ 25 mg a day.  6. Multivitamin.   Multiple alternative medications:  1. Fish oil 1000 mg p.o. t.i.d.  2. Simvastatin 80 mg at bedtime.  3. Enteric-coated aspirin 81 mg a day.  4. Amlodipine 2.5 mg a day.   PHYSICAL EXAMINATION:  VITAL SIGNS:  Blood pressure is 152/84.  He says  it is much better at home.  His pulse is 68 and regular.  Weight is 208,  up 7.  HEENT:  He is legally blind.  His sclerae are slightly injected.  Facial symmetry is normal.  NECK:  Supple.  Carotids upstrokes were equal bilaterally with soft  systolic bruits bilaterally.  Thyroid is not enlarged.  Trachea is  midline.  LUNGS:  Clear.  HEART:  Reveals a poorly appreciated PMI.  Normal S1 and S2.  ABDOMEN:  Protuberant, good bowel sounds.  EXTREMITIES:  No edema.  Pulses were present 2+/4+ in the lower  extremities.  He has signs of microvascular disease with delayed  capillary reflexes.  NEUROLOGIC:  Intact.   His EKG is normal except for some mild voltage criteria for LVH.   ASSESSMENT/PLAN:  Mr. Pietrzak is  doing well.  Will set him up for an  adenosine rest stress Myoview as well as carotid Dopplers.  Assuming  these are stable, I will see him back in a year.     Thomas C. Daleen Squibb, MD, [facility]  Electronically Signed    TCW/MedQ  DD: 01/20/2008  DT: 01/21/2008  Job #: [id number]   cc:   [name], M.D.

## 2011-04-19 NOTE — Consult Note (Signed)
Rodney Christensen, Rodney Christensen NO.:  [id number]   MEDICAL RECORD NO.:  [id number]          PATIENT TYPE:  INP   LOCATION:  2019                         FACILITY:  [facility]   PHYSICIAN:  Quita Skye. Hart Rochester, M.D.  DATE OF BIRTH:  04-15-1941   DATE OF CONSULTATION:  10/08/2004  DATE OF DISCHARGE:                                   CONSULTATION   REFERRING PHYSICIAN:  1.  Sherryl Manges, M.D.  2.  Charlett Lango, M.D.   CHIEF COMPLAINT:  Carotid occlusive disease, preop coronary artery bypass  grafting.   HISTORY OF PRESENT ILLNESS:  This 70 year old gentleman was admitted two  days ago with a 7-10 day history of crescendo angina including some chest  discomfort at rest or with very minimal activity.  He was found to have  severe three vessel coronary artery disease with a good ejection fraction  and is scheduled for coronary artery bypass grafting in 2 days by Dr.  Dorris Fetch.  He is also found to have 75-80% left internal carotid  stenosis.  He denies any hemispheric TIAs such as hemiparesis, aphasia or  other specific stroke type symptoms.  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  He denies any history of  dizziness or syncope.  He also has some lower extremity claudication  symptoms.   PAST MEDICAL HISTORY:  1.  Hypertension.  2.  Hyperlipidemia.  3.  History of tobacco abuse.  4.  Actinic keratosis.   PAST SURGICAL HISTORY:  None.   SOCIAL HISTORY:  Quit smoking in 1982, smoked up to 1 pack of cigarettes per  day prior to that time.  Drinks alcohol socially.   ALLERGIES:  NONE KNOWN.   PHYSICAL EXAM:  Blood pressure is 208/100, heart rate 99, respirations are  18.  Generally, he is an alert and oriented male who is in no apparent  distress.  He does have legal blindness.  NECK:  Supple with 3+ carotid pulses.  NEUROLOGIC EXAM:  Normal with the exception of the blindness.  Upper  extremity pulses are 2-3+  bilaterally.  CHEST:  Clear to auscultation.  ABDOMEN:  Soft, nontender with no masses palpable.  EXTREMITY EXAM:  Reveals 3+ femoral, popliteal and 2+ posterior tibial  pulses bilaterally.  Both feet are well perfused.   I reviewed the carotid duplex exam performed on October 08, 2004, at [facility] and this reveals an approximate 75-80% left internal carotid  stenosis.  The end diastolic pressure on the left is only 62, suggesting  lower range of this area of stenosis.  Right internal carotid has a mild  stenosis.   IMPRESSION:  Asymptomatic moderately severe left internal carotid stenosis  with severe coronary artery disease, preop coronary artery bypass grafting.   RECOMMENDATION:  I do not believe combined coronary artery bypass grafting  and carotid endarterectomy is indicated at this time with this asymptomatic,  but moderately severe, lesion.  I suspect he will need a left carotid  endarterectomy in the near future and I will follow him in  my office  following his discharge by Dr. Dorris Fetch.  Discussed this with the patient  and his wife and they are in full agreement.       JDL/MEDQ  D:  10/08/2004  T:  10/08/2004  Job:  [id number]

## 2011-04-19 NOTE — H&P (Signed)
NAME:  Rodney Christensen, Rodney Christensen NO.:  [id number]   MEDICAL RECORD NO.:  [id number]          PATIENT TYPE:  EMS   LOCATION:  MAJO                         FACILITY:  [facility]   PHYSICIAN:  Lorain Childes, M.D. LHCDATE OF BIRTH:  30-Sep-1941   DATE OF ADMISSION:  10/06/2004  DATE OF DISCHARGE:                                HISTORY & PHYSICAL   PRIMARY CARE PHYSICIAN:  Dr. [name].   He is new to cardiology.   CHIEF COMPLAINT:  Chest pain.   HISTORY OF PRESENT ILLNESS:  The patient is a 70 year old gentleman with a  history of hypertension, hyperlipidemia, who presents to the ER for  evaluation of recent onset of chest pain.  The patient reports chest  tightness with exertion for the past 7-10 days, relieved with rest and able  to continue with his normal activities.  This morning around 7 a.m. the  patient noted chest pain as he was going to the restroom.  He took one Tums  with no significant change and then took an aspirin which improved his  symptoms after approximately 15 minutes.  The pain returned when he was  watering his shrubs and again when he was walking in the store this  afternoon around 2:30 p.m.  He reports shortness of breath with this pain  which is transient.  He denied any radiation.  No nausea or vomiting.  He  did report diaphoresis.  His wife brought him to the emergency room at which  time he was pain free.  The greatest intensity of his pain was approximately  a 5 out of 10.  In the ER, he was given aspirin.  Point-of-care cardiac  enzymes were negative.  I was then called for admission.  Of note, the  patient also reports that he typically does the treadmill 3-4 times per  week, but he has not done it for the past few days due to chest tightness.  He denies any palpitations, no syncope, no dyspnea on exertion, no PND, no  orthopnea, no lower extremity edema.   PAST MEDICAL HISTORY:  1.  Hypertension.  2.  Hyperlipidemia.  3.  He has past  history of tobacco abuse.  4.  Legal blindness with macular degeneration.  5.  Actinic keratosis.  6.  Allergic rhinitis.   PAST SURGICAL HISTORY:  None.   SOCIAL HISTORY:  He lives in [place] with his wife.  He is retired from  Nurse, adult.  He has a 20 pack year history of tobacco.  He quit in  1982.  He drinks alcohol socially.  He denies any drugs.   He takes many multivitamins, B-complex, vitamin C, vitamin E, and Ginkgo  biloba as herbal medications.   DIET:  He has been following a modified Atkins diet which he lost 40 pounds,  but he has gained approximately 15 pounds back.  Exercise, he typically does  the treadmill several days a week.   FAMILY HISTORY:  His mother died at the age of 60 from normal causes.  Father died at the age of 59 with  COPD.  He has one sister who had a stroke  at the age of 13.  He has four brothers.  Three are healthy.  One had a CABG  at the age of 77 this past year, and he had a stent placed at the age of 63.   MEDICATIONS:  1.  Aspirin 81 mg p.o. daily.  2.  Doxazosin 8 mg p.o. daily.  3.  Folate 1 mg p.o. daily.  4.  Metoprolol 50 mg p.o. b.i.d.  5.  Lisinopril 40 mg p.o. b.i.d.  6.  Hydrochlorothiazide 25 mg p.o. daily.  7.  Lovastatin 40 mg p.o. q.h.s.  8.  Multivitamin one tablet daily.  9.  B-complex one tablet daily.  10. Beta carotene one tablet daily.  11. Vitamin C one tablet daily.  12. Vitamin E one tablet daily.  13. Calcium magnesium supplement one tablet daily.  14. Gingko biloba one tablet daily.  15. Fish oil 1,000 mg t.i.d.  16. Selenium.   He has no known drug allergies.   REVIEW OF SYSTEMS:  No fevers or chills.  No sweats.  He has had intentional  weight loss as described above.  No headaches.  He is legally blind from  macular degeneration.  No hearing loss.  No voice changes.  No new skin  rashes or lesions.  CARDIOPULMONARY:  Per HPI.  Also denies any  palpitations, syncope, claudication, no cough,  no wheezing.  GU:  He denies  any urinary frequency or urgency, dysuria, no hematuria.  GI:  No nausea,  vomiting.  He does report diarrhea.  No bright red blood per rectum.  No  melena.  No hematemesis.  No dysphagia, odynophagia.  He rarely has any  GERD.  ENDOCRINE:  Negative.  All other systems are negative.   PHYSICAL EXAMINATION:  VITAL SIGNS:  Temperature 97.6, pulse 99,  respirations 18, blood pressure is 208/105.  He is sating 97% on room air.  GENERAL:  He is a pleasant man, comfortable, in no acute distress, age  appropriate.  HEENT:  Normocephalic, atraumatic.  Oropharynx is clear.  NECK:  Supple.  JVP is approximately 6-cm.  He has no carotid bruits.  CARDIOVASCULAR:  Normal S1, split S2.  Regular rate and rhythm.  There is an  S4.  There is no S3.  He has no murmurs.  He has a left femoral bruit which  is soft.  Otherwise no other bruits were noted.  LUNGS: Clear to auscultation bilaterally.  ABDOMEN:  Soft, nontender, nondistended.  Normal bowel sounds.  No  organomegaly.  EXTREMITIES:  He has no edema.  He has 2+ distal pulses.  NEURO:  He is alert and oriented x 3.  He is legally blind.  Otherwise  cranial nerves are intact.  His strength is 5/5 upper extremities lower  extremities bilaterally.  Sensation is intact.   EKG:  Rate of 83, rhythm sinus, axis normal, PR is 158, QRS is 56  milliseconds, QTC is 460 milliseconds.  He has a late transition and  evidence of LVH.  No acute ischemic changes.   LABORATORY DATA:  White count is 6.5, hematocrit 42, platelets 143.  Potassium is 3.2, sodium is 131, creatinine is 1.0, glucose is 108.  Point-  of-care cardiac enzymes, his CK-MB is 1.5, then 2.7, then 1.8.  Troponin is  less than 0.05 all three times.   ASSESSMENT:  1.  The patient is a 70 year old gentleman who was admitted with new-onset  angina.  2.  Acute coronary syndrome.  3.  Unstable angina.   PLAN: 1.  Coronary artery disease.  A patient with new  onset angina.  He has      classic typical symptoms.  I will admit him to telemetry, cycle his      cardiac enzymes, and follow his EKG.  Need to aggressively control his      blood pressure.  We will give him Lopressor 5 mg IV x 3.  He has      received aspirin down here.  We will start him on nitroglycerin,      continue his home ACE inhibitor.  When his blood pressure is better      controlled, then I will anticoagulate him with heparin.  Also continue      his home dose of Lovastatin.  We will plan for a cardiac catheterization      on Monday.  2.  Hypertension.  We will continue his home medications and titrate them as      needed, also titrate any of the above medications as described.  3.  Hyperlipidemia.  We will check a fasting lipid panel and continue his      lovastatin for now.  4.  Legal blindness.  We will put a sign in his room noting that he is      legally blind to assist in his care.  5.  Hypokalemia likely from his hydrochlorothiazide.  We will supplement his      potassium as needed.       CGF/MEDQ  D:  10/06/2004  T:  10/06/2004  Job:  [id number]

## 2011-04-19 NOTE — Assessment & Plan Note (Signed)
[facility]                            CARDIOLOGY OFFICE NOTE   NAME:EGANRender, Marley                           MRN:          [id number]  DATE:11/05/2006                            DOB:          Nov 26, 1941    [name] returns today for further management of coronary artery  disease.  He is having no angina or ischemic symptoms. He walks on a  treadmill, though he is blind, almost every day.   He recently saw Dr. Jerilee Field for his left carotid endarterectomy.  Apparently he had Dopplers and was released from the practice.   His blood work is being followed by Dr. Tresa Endo.   His meds are outlined in the maintenance medication list and are  unchanged except he is now taking amlodipine 2.5 mg a day.   PHYSICAL EXAMINATION:  On exam, today, his blood pressure is 140/82, his  pulse 52 and regular.  His weight is 201.  HEENT:  Normocephalic, atraumatic.  Ruddy complexion, PERLA.  Extraocular movements are intact.  He wears glasses. Facial asymmetry is  normal.  Carotid upstrokes are equal bilaterally with a left carotid scar noted.  There are no bruits. Thyroid is not enlarged. Trachea is midline.  Lungs were clear.  Heart reveals a soft S1-S2.  Sternotomy site is stable.  Abdominal exam is soft with good bowel sounds.  Extremities reveal no clubbing, cyanosis, or edema.  Pulses are present.  There is no edema.  Musculoskeletal is intact.  Skin is intact.   ELECTROCARDIOGRAM:  Shows sinus rhythm with small Q's inferiorly which  are unchanged.   ASSESSMENT AND PLAN:  [name] is doing well.  I have made no change in  his program.  We will plan on seeing him back in a year.  At that time  he will need carotid Dopplers and a stress Myoview.     Thomas C. Daleen Squibb, MD, [facility]  Electronically Signed    TCW/MedQ  DD: 11/05/2006  DT: 11/06/2006  Job #: [id number]   cc:   Dr. [name]

## 2011-04-19 NOTE — Consult Note (Signed)
[name], [name] NO.:  [id number]   MEDICAL RECORD NO.:  [id number]          PATIENT TYPE:  INP   LOCATION:  2019                         FACILITY:  [facility]   PHYSICIAN:  Salvatore Decent. Dorris Fetch, M.D.DATE OF BIRTH:  December 22, 1940   DATE OF CONSULTATION:  10/08/2004  DATE OF DISCHARGE:                                   CONSULTATION   CARDIOVASCULAR THORACIC SURGERY CONSULTATION   DATE OF CONSULTATION:  October 12, 2004.   REASON FOR CONSULTATION:  Three vessel coronary disease.   CHIEF COMPLAINT:  Chest tightness.   HISTORY OF PRESENT ILLNESS:  Rodney Christensen is a 70 year old gentleman with no  prior  cardiac history.  He does have a history of hypertension and  hyperlipidemia.  He noted in June that he would get chest tightness with  exertion, usually when walking on a treadmill or doing work around the  house.  This would usually pass within five to ten minutes with rest.  He  wasn't sure that this was a major problem but he noted towards the end of  this summer it was becoming a little more frequent.  Approximately 7 to 10  days prior to admission he noted that he began having more severe episodes,  more frequent and also having episodes with rest or minimal activity.  On  the morning of October 06, 2004 he had severe pain after going to the  bathroom. He took Tums without relief and then tried aspirin and rest.  His  pain improved for about 15 minutes and then returned and waxed and waned  throughout the day.  He was having some shortness of breath as well as some  mild diaphoresis.  He did not have any nausea or vomiting.  He called his  doctor's office and they recommended he go to the emergency room where he  presented.  He was diagnosed with unstable angina.  His electrocardiogram  showed some left ventricular hypertrophy but no acute ischemic changes.  He  ruled out for a myocardial infarction but did have mildly elevated troponin  levels.  Today he  underwent cardiac catheterization which showed severe  three vessel disease in the setting of the left dominant circulation.  His  ejection fraction ws approximately 60%.   PAST MEDICAL HISTORY:  His past history is significant for hypertension,  hyperlipidemia, macular degeneration with legal blindness.  He has remote  tobacco abuse.  He may have some mild prostatic hypertrophy.   MEDICATIONS ON ADMISSION:  1.  Aspirin 81 mg daily.  2.  Doxazosin 8 mg daily.  3.  Folate 1 mg daily.  4.  Metoprolol 50 mg b.i.d.  5.  Lisinopril 40 mg b.i.d.  6.  Hydrochlorothiazide 25 mg daily.  7.  Lovastatin 40 mg q.h.s.  8.  He does take multivitamins.   ALLERGIES:  He has no known drug allergies.   FAMILY HISTORY:  His family history with father dying of emphysema.  He has  had a sister who had a stroke and one of his brothers had bypass surgery.   SOCIAL HISTORY:  He lives in [place] with his wife.  He is retired.  He  smoked one pack a day for 20 years; he quit in 1982.  He uses ethanol  occasionally socially.  He does exercise regularly.   REVIEW OF SYMPTOMS:  He has occasional headaches.  He has had weight loss.  He lost approximately 40 pounds on Atkin's Diet but has gained 15 back since  he stopped.  He has visual issues, essentially only peripheral vision due to  macular degeneration.  He has no orthopnea, paroxysmal nocturnal dyspnea,  peripheral edema.  He has had no stroke or transient ischemic attack  symptoms.  He does have urgency and dysuria.  He denies any hematuria.  He  does not have odynophagia, melena or hematemesis.  All other systems are  negative.   PHYSICAL EXAMINATION:  GENERAL:  Rodney Christensen is a 70 year old gentleman in no  acute distress.  He is well-developed and well-nourished.  VITAL SIGNS:  Blood pressure 178/90.  Respirations 16.  Pulse 90 and  regular.  He is afebrile.  NEUROLOGICAL:  He is alert and oriented X3.  He is grossly intact, motor and  sensory.   Vision was not tested.  NECK:  Supple.  There is a loud bruit on the right.  There is a less  prominent bruit on the left.  There is no thyromegaly or adenopathy.  CARDIAC:  Regular rate and rhythm with normal S1 and S2.  There is an S4.  ABDOMEN:  His abdomen is soft and nontender.  EXTREMITIES:  Without cyanosis, clubbing or edema.  He has 2+ pulses  throughout.  SKIN:  Warm and dry.   LABORATORY DATA:  Electrocardiogram with left ventricular hypertrophy.  Chest x-ray with no evidence of active disease.  Sodium is 135, potassium  3.7, BUN and creatinine 5.0 and 0.7.  His white blood cell count 6.8,  hematocrit 35, platelet count 146,000.  The Pro Time is 12.8, PTT is 30.  Sodium 135, potassium 3.7, chloride 107, cO2 27.  Liver function tests  within normal limits.  Glycosylated hemoglobin is normal.  CK was 98 with MB  2.4.  His peak troponin was 0.16.  Cholesterol is 186, triglycerides 147,  HDL 45.   IMPRESSION:  Rodney Christensen is a 70 year old gentleman who presents with crescendo  unstable angina.  At catheterization he has severe three vessel disease and  left dominant circulation.  Coronary artery bypass grafting is indicated for  survival benefit and relief of symptoms.   He has bilateral carotid disease.  He has a lot of bruit on the right but on  the left he has possible greater than 80% internal carotid artery stenosis.  The study was complex and in talking with the technician there was acoustic  shadowing which may obscure a tighter stenosis.   In terms of his heart, coronary artery bypass grafting is indicated for  survival benefit and relief of symptoms.  I have discussed in detail with  the patient the nature and extent of the operation including the incision  that is used, expected hospital stay and overall recovery.  He understands  the indications, risks, benefits and alternatives.  He understands the risks include, but are not limited to, death, stroke, myocardial  infarction, deep  venous thrombosis, pulmonary embolus, bleeding with possible need for  transfusions, infections, other organ system dysfunction including  respiratory, renal or gastrointestinal complications. He also understands  the possibility for early or late graft failure.   We  will need a vascular surgery consult to assess his carotid Doppler's as  well as his possible need for combined carotid endarterectomy and coronary  artery bypass grafting.  Will tentatively plan operating room on Wednesday  October 10, 2004; this could change depending upon the status of his  carotid's.       SCH/MEDQ  D:  10/08/2004  T:  10/08/2004  Job:  [id number]   cc:   Dr. Loyce Dys, [place]

## 2011-04-19 NOTE — Op Note (Signed)
NAMELACOREY, BRUSCA NO.:  [id number]   MEDICAL RECORD NO.:  [id number]          PATIENT TYPE:  AMB   LOCATION:  ENDO                         FACILITY:  [facility]   PHYSICIAN:  Bernette Redbird, M.D.   DATE OF BIRTH:  08-31-1941   DATE OF PROCEDURE:  02/25/2005  DATE OF DISCHARGE:                                 OPERATIVE REPORT   PROCEDURE:  Colonoscopy.   INDICATION:  Colon cancer screening in a standard-risk individual.   FINDINGS:  Sigmoid diverticulosis.   PROCEDURE:  The nature, purpose and risks of the procedure had been  discussed with the patient who provided written consent. Sedation was  fentanyl 50 mcg and Versed 6 mg IV without arrhythmias or desaturation.   The Olympus adjustable tension pediatric video colonoscope was advanced to  the cecum as identified by clear visualization of the appendiceal orifice  and pullback was then performed. The quality of prep was excellent and it  was felt that all areas were well seen.   This was normal examination except for moderate sigmoid diverticulosis. No  polyps, cancer, colitis or vascular malformations were observed and  retroflexion in the rectum was unremarkable. No biopsies were obtained. The  patient tolerated the procedure well and there no apparent complications.   IMPRESSION:  1.  Screening colonoscopy without worrisome findings in a standard-risk      individual (V76.51)  2.  Sigmoid diverticulosis, moderate.   PLAN:  Consider flexible sigmoidoscopy in five years for continued  screening.      RB/MEDQ  D:  02/25/2005  T:  02/25/2005  Job:  [id number]   cc:   [name], M.D.  [place], [place]   Jesse Sans. Wall, M.D.

## 2011-04-19 NOTE — Op Note (Signed)
Rodney Christensen, Rodney Christensen NO.:  [id number]   MEDICAL RECORD NO.:  [id number]          PATIENT TYPE:  OIB   LOCATION:  2899                         FACILITY:  [facility]   PHYSICIAN:  Quita Skye. Hart Rochester, M.D.  DATE OF BIRTH:  1941/03/06   DATE OF PROCEDURE:  04/12/2005  DATE OF DISCHARGE:                                 OPERATIVE REPORT   PREOPERATIVE DIAGNOSIS:  Severe left internal carotid stenosis -  asymptomatic.   POSTOPERATIVE DIAGNOSES:  1.  Severe left internal carotid stenosis - asymptomatic.  2.  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   PROCEDURES:  1.  Left carotid endarterectomy with Dacron patch angioplasty.  2.  Resection of salivary gland, left neck - chronically inflamed -      (submandibular gland).   SURGEON:  Josephina Gip, M.D.   FIRST ASSISTANT:  Pecola Leisure, PA   ANESTHESIA:  General endotracheal.   BRIEF HISTORY:  This patient underwent coronary artery bypass grafting 6  months ago and carotid duplex exam at that time revealed a moderate stenosis  of the left internal carotid, which is asymptomatic. He returned for  followup and now has progressed to a very severe left internal carotid  stenosis which remains asymptomatic. He was scheduled for left carotid  endarterectomy.   DESCRIPTION OF PROCEDURE:  The patient was taken to the operating room,  placed in the supine position at which time satisfactory general  endotracheal anesthesia was administered. The left neck was prepped with  Betadine scrub and solution and draped in the routine, sterile manner.  Incision was made on the anterior border of the sternocleidomastoid muscle  and carried down through subcutaneous tissue and platysma using the Bovie.  Common facial vein and external jugular veins were ligated with 3-0 silk  ties and divided. The common internal and external carotid arteries were  dissected free. Prior to exposing the  carotid vessel, there was a large  prominent, smooth mass which appeared to either be a lymph node or salivary  gland medially located extending down over the carotid bifurcation. This  mass was much larger than the usual findings measuring approximately 6 cm x  3 cm. It was felt that this might represent a tumor or an abnormal lymph  node and it was decided to resect this. This was done prior to the carotid  endarterectomy by staying on the plane of the tissue which appeared to be  more and more like salivary gland as it was resected. Care was taken not to  injure the vagus or hypoglossal nerves, or any of the other nerves in the  area. After completely excising this mass, it was sent to the lab for frozen  section and it did appear to be chronically inflamed salivary gland tissue.  Attention was then returned to the carotid vessels where a #10 shunt was  prepared and the patient was then heparinized. The carotid vessels were  occluded with vascular clamps. Longitudinal opening made in the common  carotid with a #15 blade,  extended up the internal carotid with the Potts  scissors to a point distal to the disease. The carotid arteries almost  totally occluded at the bifurcation but the distal vessel appeared normal  with no pulse. A #10 shunt was inserted without difficulty reestablishing  flow in about 2 minutes. There was excellent back bleeding coming from the  distal vessel. Standard endarterectomy was then performed using the elevator  and the Potts scissors with eversion endarterectomy of the external carotid.  The plaque feathered off the distal internal carotid artery nicely not  requiring any tacking sutures. The lumen was thoroughly irrigated with  heparin and saline. All loose debris was carefully removed and the  arteriotomy was closed with a patch using continuous 6-0 Prolene. Prior to  completion of closure, the shunt was removed after about 35 minutes of shunt  time following  antegrade and retrograde flushing. The closure was completed  reestablishing flow initially up the external and internal branch. Carotid  was occluded for less than 2 minutes before removal of the shunt. Protamine  was then given to reverse the heparin. Adequate hemostasis was achieved.  Jackson-Pratt drain was brought out through an inferiorly based stab wound  and secured with a nylon suture, and the wound was closed with Vicryl in a  subcuticular fashion. Sterile dressing was applied. The patient was taken to  the recovery room in satisfactory condition.      JDL/MEDQ  D:  04/12/2005  T:  04/12/2005  Job:  [id number]

## 2011-04-19 NOTE — Discharge Summary (Signed)
NAMEDENARIO, BAGOT NO.:  [id number]   MEDICAL RECORD NO.:  [id number]          PATIENT TYPE:  INP   LOCATION:  2025                         FACILITY:  [facility]   PHYSICIAN:  Salvatore Decent. Dorris Fetch, M.D.DATE OF BIRTH:  May 31, 1941   DATE OF ADMISSION:  10/06/2004  DATE OF DISCHARGE:  10/14/2004                                 DISCHARGE SUMMARY   REFERRING PHYSICIAN:  Duke Salvia, M.D.   HISTORY OF PRESENT ILLNESS:  The patient is a 70 year old gentleman with a  history of hypertension and hyperlipidemia who presented to the emergency  room with chest pain.  The patient reported chest tightness with exertion  for the past seven to 10 days prior to admission.  The symptoms were  relieved with rest and he was able to continue his normal activities.  On  the day of admission, the patient again noticed the pain while going to the  restroom.  He initially took Tums with no significant change and then took  aspirin which improved his symptoms after approximately 15 minutes,  reportedly.  The pain returned when he was watering his shrubs and again  when walking to the store later in the day.  He reported shortness of breath  with this pain which was transient.  He denied any radiation, nausea or  vomiting, however, did report diaphoresis.  He was brought to the emergency  room by his wife.  He was felt to require admission for further evaluation  and treatment.   PAST MEDICAL HISTORY:  1.  Hypertension.  2.  Hyperlipidemia.  3.  History of tobacco use.  4.  Legal blindness with macular degeneration.  5.  Actinic keratosis.  6.  Allergic rhinitis.   PAST SURGICAL HISTORY:  None.   For family history, social history, review of systems, and physical  examination please see the history and physical done at the time of  admission.   MEDICATIONS ON ADMISSION:  1.  Aspirin 81 mg daily.  2.  Doxazosin 8 mg p.o. daily.  3.  Folate 1 mg daily.  4.  Metoprolol 50 mg  b.i.d.  5.  Lisinopril 40 mg b.i.d.  6.  Hydrochlorothiazide 25 mg daily.  7.  Lovastatin 40 mg q.h.s.  8.  Multivitamin one daily.  9.  B complex one tablet daily.  10. Beta carotene one tablet daily.  11. Vitamin C one daily.  12. Vitamin E one daily.  13. Calcium magnesium supplement one daily.  14. Ginkgo biloba one daily.  15. Fish oil 1000 mg t.i.d.  16. Selenium.   ALLERGIES:  No known drug allergies.   HOSPITAL COURSE:  The patient was admitted with presumed acute coronary  syndrome.  EKG showed no ischemic changes.  Initial troponin was less than  0.05 x3.  Cardiac enzymes were negative.  He was admitted to telemetry.  Cardiology consultation was obtained.  Initial diagnosis was unstable  angina.  He was felt to require cardiac catheterization.  He was stabilized  from a medical view point.  His troponin did elevate to 0.16.  He  was  started continued on  aspirin, Lopressor, Zocor, Lisinopril and Fosamax.  Cardiac catheterization was scheduled and undertaken on October 08, 2004.  He was found to have extensive three vessel coronary artery disease and a  left dominant system. His ejection fraction was approximately 60%.  He was  felt to be a candidate for surgical revascularization and consultation was  obtained with Viviann Spare C. Dorris Fetch, M.D., who evaluated the patient and  studies and recommended proceeding with surgical intervention.  He was found  to have a symptomatic left internal carotid artery stenosis in the 75 to 80%  range.  He was seen in vascular surgical consultation by Dr. Hart Rochester.  He was  felt to be a candidate for further conservative management with possible  intervention in the future but not simultaneous with his coronary surgery.   PROCEDURE:  On October 10, 2004, he was taken to the operating room where he  underwent the following procedure:  Coronary artery bypass grafting x5.  The  following grafts were placed.  1.  Left internal mammary artery to  LAD.  2.  Saphenous vein graft to the diagonal.  3.  Saphenous vein graft to the posterior descending.  4.  Sequential saphenous vein graft  to ramus intermedius and obtuse      marginal.  The patient tolerated the procedure well and was taken to the surgical  intensive care unit in stable condition.   POSTOPERATIVE HOSPITAL COURSE:  Patient did have a mild postoperative  coagulopathy requiring platelet transfusion.  Additionally he had  postoperative anemia and was transfused.  His chest tube drainage was  initially high but slowed quickly.  His hemodynamics remained stable.  He  was weaned from the ventilator without difficulty.  He tolerated routine  progression and activities.  All routine lines, monitors and drainage  devices were discontinued in standard fashion.  He did require additional  transfusion on postoperative day #2 for hematocrit of 21%.  This improved  postoperative day #3 to 24%.  He was restarted on his ACE inhibitor at a low  dose.  His incisions were healing well.  He was weaned from oxygen and  maintained good saturations on room air.  His overall status was felt to be  stable for discharge on October 14, 2004.   DISCHARGE MEDICATIONS:  1.  Aspirin 81 mg daily.  2.  Toprol XL 50 mg daily.  3.  Lisinopril 20 mg twice a day.  4.  Lovastatin 40 mg daily.  5.  Cardura 8 mg q.h.s.  6.  Folate 1 mg daily.  7.  Lasix  40 mg daily x7 days.  8.  K-Dur 20 mEq daily x7 days.  9.  After his Lasix was discontinued, he was to resume his      hydrochlorothiazide 25 mg each day.  10. For pain, Tylox one or two q.4-6h. p.r.n.   INSTRUCTIONS:  The patient received written instructions regarding  medications, activity, diet, wound care and follow-up.   FOLLOW UP:  1.  Dr. Daleen Squibb in two weeks.  2.  Dr. Dorris Fetch on November 08, 2004, at 11:30.   CONDITION ON DISCHARGE:  Stable and improved.   FINAL DIAGNOSES: 1.  Severe coronary artery disease as described with unstable  angina on      presentation.  2.  Hyperlipidemia.  3.  Macular degeneration.  4.  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  5.  Hypertension.  6.  Postoperative anemia.      WEG/MEDQ  D:  01/01/2005  T:  01/01/2005  Job:  [id number]   cc:   Duke Salvia, M.D.   Lorain Childes, M.D. [facility]   Rollene Rotunda, M.D.

## 2011-04-19 NOTE — Cardiovascular Report (Signed)
NAMESKYELAR, SWIGART NO.:  [id number]   MEDICAL RECORD NO.:  [id number]          PATIENT TYPE:  INP   LOCATION:  2019                         FACILITY:  [facility]   PHYSICIAN:  Rollene Rotunda, M.D.   DATE OF BIRTH:  21-Apr-1941   DATE OF PROCEDURE:  10/08/2004  DATE OF DISCHARGE:                              CARDIAC CATHETERIZATION   PRIMARY CARE PHYSICIAN:  [name], M.D., [place], [place].   PROCEDURES:  Left heart catheterization/coronary arteriography.   INDICATIONS:  Evaluate the patient with unstable angina.   DESCRIPTION OF PROCEDURE:  Left heart catheterization was performed via the  right femoral artery.  The artery was cannulated using anterior wall  puncture.  A #6 French arterial sheath was inserted via the modified  Seldinger technique.  Preformed Judkins and a pigtail catheter were  utilized.  The patient tolerated the procedure well and left the laboratory  in stable condition.   RESULTS:  Hemodynamics:  LV 138/28, AO 144/62.   Coronaries:  The left main had 25% stenosis with calcification.  The LAD had  ostial 25% stenosis.  There was a long proximal 75% stenosis after the first  diagonal and another 75% stenosis after the septal perforator.  There was a  nonobstructive diffuse mid and distal disease, which was mild.  There was a  focal mid 80% lesion.  The first diagonal was small with ostial 75%  stenosis.  The second diagonal was large with diffuse mid 80% stenosis.  The  circumflex was a large dominant vessel.  In the AV groove there was a focal  proximal 75% stenosis.  The ramus intermediate was moderate size with a long  proximal and mid 80% stenosis.  There was a large branch mid obtuse marginal  with proximal 80% stenosis involving the superior branch.  There were  multiple small posterolaterals following this with diffuse disease.  One of  these was subtotally stenosed.  The PDA was a large vessel.  There was a  long  proximal and mid 80% stenosis.  The right coronary artery was  nondominant.  There was 60-70% stenosis in the RV branch.   Left ventriculogram:  The left ventriculogram was obtained in the RAO  projection.  The EF was approximately 65%.   CONCLUSION:  Severe three-vessel equivalent coronary disease.  Well-  preserved ejection fraction.   PLAN:  I have referred the patient for CVTS consult for CABG.  He will  continue with aggressive secondary risk reduction.       JH/MEDQ  D:  10/08/2004  T:  10/08/2004  Job:  [id number]   cc:   [name], M.D.  [place], [place]

## 2011-04-19 NOTE — Op Note (Signed)
NAMEMINH, ROANHORSE NO.:  [id number]   MEDICAL RECORD NO.:  [id number]          PATIENT TYPE:  INP   LOCATION:  2309                         FACILITY:  [facility]   PHYSICIAN:  Salvatore Decent. Dorris Fetch, M.D.DATE OF BIRTH:  Nov 30, 1941   DATE OF PROCEDURE:  10/10/2004  DATE OF DISCHARGE:                                 OPERATIVE REPORT   PREOPERATIVE DIAGNOSIS:  Three vessel disease with unstable angina.   POSTOPERATIVE DIAGNOSIS:  Three vessel disease with unstable angina.   PROCEDURE:  Median sternotomy, extracorporeal circulation, coronary artery  bypass grafting x 5 (left internal mammary artery to left anterior  descending, saphenous vein graft to diagonal, sequential saphenous vein  graft to ramus intermedius and second obtuse marginal, saphenous vein graft  to left posterior descending), endoscopic vein harvest both thighs.   SURGEON:  Salvatore Decent. Dorris Fetch, M.D.   ASSISTANT:  Rowe Clack, P.A.-C.   ANESTHESIA:  General.   FINDINGS:  Diffusely diseased coronaries, saphenous vein satisfactory, long  segment of saphenous vein from right leg not useable due to small caliber  necessitating harvest from the left thigh, as well.  Left ventricular  hypertrophy.   CLINICAL NOTE:  Mr. Berenguer is a 70 year old gentleman who presented with  crescendo unstable angina.  At catheterization, he had left dominant  circulation with three vessel coronary artery disease.  He was advised to  undergo coronary artery bypass grafting.  The indications, risks, benefits,  and alternatives were discussed in detail with the patient, he understood  and accepted the risks and agreed to proceed with surgery.   OPERATIVE NOTE:  Mr. Moody was brought to the preop holding area on October 10, 2004.  There, lines were placed to monitor arterial, central venous and  pulmonary arterial pressure by the anesthesia service.  Intravenous  antibiotics were administered.  The patient was taken to  the operating room,  anesthetized, and intubated.  A Foley catheter was placed.  The chest,  abdomen, and legs were prepped and draped in the usual fashion.  An incision  was made in the medial aspect of the right leg at the level of the knee.  The greater saphenous vein was harvested from the right thigh  endoscopically.  Simultaneously, a median sternotomy was performed and the  left internal mammary artery was harvested using standard technique.  The  left internal mammary artery was a good quality conduit.  The patient was  fully heparinized prior to dividing the distal end of the mammary artery,  there was excellent flow through the cut end of the vessel.   The pericardium was opened, the ascending aorta was inspected and palpated,  there was no palpable atherosclerotic disease.  The aorta was cannulated via  concentric 2-0 Ethibond pledgeted purse-string sutures.  A dual stage venous  cannula was placed through a purse-string suture in the right atrial  appendage.  Cardiopulmonary bypass was instituted and the patient was cooled  to 32 degrees Celsius.  The coronary arteries were inspected and anastomotic  sites were chosen.  Of note, at this point, it  was noted that a long segment  of vein from the right leg was sclerotic and not useable, therefore,  additional vein was harvested from the left thigh.  All the vein that was  used was satisfactory.  A foam pad was placed in the pericardium to protect  the left phrenic nerve and insulate the heart.  A temperature probe was  placed in the myocardial septum.  A cardioplegic cannula was placed in the  ascending aorta.  The aorta was crossclamped, the left ventricle was emptied  via the aortic root vent, cardiopulmonary bypass was achieved with a  combination of cold antegrade cardioplegia and topical iced saline.   After achieving a complete diastolic arrest and myocardial surface  temperature of 11 degrees Celsius, the following distal  anastomoses were  performed:  First, reversed saphenous vein graft was placed end-to-side to  the posterior descending branch of the left circumflex.  This vessel was 1.5  mm at the anastomosis but was diffusely diseased, a 1 mm probe did, however,  pass to the apex, the target was a poor quality vessel.  The vein was  satisfactory and the anastomosis was performed end-to-side with a running 7-  0 Prolene suture.  There was good flow through the graft, cardioplegia was  administered, there was good hemostasis.   Next, a reversed saphenous vein graft was placed end-to-side to the first  diagonal branch of the LAD, this was a large branching anterior branch which  was also diffusely diseased, it was 1.5 mm at the site of the anastomosis.  The anastomosis was performed with a running 7-0 Prolene suture.  There was  good flow through this graft and good hemostasis.   Next, a reversed saphenous vein graft was placed sequentially to the ramus  intermedius and the dominant lateral second obtuse marginal.  The ramus  intermedius was a 1.5 mm vessel, it was heavily diseased just proximal to  the anastomosis, it was moderately diseased distally.  A side-to-side  anastomosis was performed to this vessel.  An end-to-side anastomosis was  then performed to the second obtuse marginal, this was a 1.5 mm fair quality  target, although it was in fair condition in the ramus intermedius.  Both  anastomoses were performed with running 7-0 Prolene sutures.  Both were  probed proximally and distally at their completion to insure patency.  Cardioplegia was administered, there was good flow, and good hemostasis.  There was some bleeding from the dissection to the intramyocardial ramus  intermedius.   Next, the left internal mammary artery was brought through a window in the  pericardium, the distal end was spatulated, it was anastomosed end-to-side to the distal LAD.  The LAD was intramyocardial in its mid  portion, it was  grafted where it became epicardial, again, it was 1.2 mm vessel at this site  but was free of disease.  The anastomosis was performed end-to-side with a  running 8-0 Prolene suture.  At the completion of the mammary to the LAD  anastomosis, the bulldog clamp was briefly removed and immediate rapid  septal rewarming was noted.  The bulldog clamp was replaced.  The mammary  pedicle was tacked to the epicardial surface of the heart with 6-0 Prolene  sutures.  Additional cardioplegia was administered.   The vein grafts were cut to length.  The proximal vein graft anastomoses  were performed to 4 mm punch aortotomies with running 6-0 Prolene sutures.  At the completion of the final proximal anastomosis, the  patient was placed  in Trendelenburg position.  Lidocaine was administered.  The bulldog clamp  was again removed from the mammary artery.  The aortic root was deaired and  the aortic Cross clamp was removed, total crossclamp time was 97 minutes.  While the patient was being rewarmed, all proximal and distal anastomoses  were inspected for hemostasis.  Epicardial pacing wires were placed on the  right ventricle and right atrium.  The patient required three  defibrillations, the first with 10 joules, the following two with 20 joules  with biphasic wave form.  After the patient had been rewarmed to a core  temperature of 37 degrees Celsius, he was weaned from cardiopulmonary bypass  without difficulty.  Total bypass time was 196 minutes.  The initial cardiac  index was greater than 2 liters per minute per meter squared and the patient  remained hemodynamically stable throughout post bypass.   A test dose of protamine was administered and was well tolerated.  The  atrial and aortic cannulae were removed, the remainder of the protamine was  administered without incident.  The chest was irrigated with 1 liter of warm  normal saline containing 1 gram vancomycin.  The pericardium  was  reapproximated with interrupted 3-0 silk sutures.  This came together easily  without tension.  Left pleural and two mediastinal chest tubes were placed  through separate subcostal incisions.  The sternum was closed with heavy  gauge stainless steel wires.  OnQ catheters were placed bilaterally along  the anterior costal margin and 5 mL of Bupivacaine was administered in each  catheter.  The  remainder of the incision was closed in standard fashion with subcuticular  closure used for the skin.  All sponge, instrument, and needle counts were  correct at the end of the procedure.  Mr. Arth was taken from the operating  room to the surgical intensive care unit in critical but stable condition.       SCH/MEDQ  D:  10/10/2004  T:  10/10/2004  Job:  [id number]   cc:   [name], M.D.   Duke Salvia, M.D.

## 2011-04-19 NOTE — H&P (Signed)
NAMEHARUTYUN, Rodney Christensen NO.:  [id number]   MEDICAL RECORD NO.:  [id number]           PATIENT TYPE:   LOCATION:                               FACILITY:  [facility]   PHYSICIAN:  Rodney Christensen, M.D.  DATE OF BIRTH:  21-Jan-1941   DATE OF ADMISSION:  04/12/2005  DATE OF DISCHARGE:                                HISTORY & PHYSICAL   CHIEF COMPLAINT:  Severe left internal carotid stenosis, asymptomatic.   HISTORY AND PRESENT ILLNESS:  Rodney Christensen is a 70 year old gentleman who  underwent coronary artery bypass grafting by Rodney Christensen on  November 9 for severe three-vessel coronary artery disease with unstable  angina.  He was found to have carotid occlusive disease during his  preoperative evaluation which was asymptomatic.  He was evaluated by Dr.  Hart Christensen at that time and felt to have a 75% left internal carotid stenosis  which did not require combined surgery but needed followup.  Upon return for  six month followup, he was found to have severe progression of disease with  95% left internal carotid stenosis which remains asymptomatic and is now  scheduled for elective carotid surgery.  He denies any hemispheric or non  hemispheric TIAs, amaurosis fugax, diplopia, blurred vision, or syncope.   PAST MEDICAL HISTORY:  1.  Hypertension.  2.  Hyperlipidemia.  3.  Macular degeneration with legal blindness.  4.  Mild prostatic hypertrophy.  5.  Coronary artery disease, status post coronary artery bypass grafting.   Denies any COPD, deep vein thrombosis, pulmonary emboli, or diabetes.   FAMILY HISTORY:  He had a father who died of emphysema, a sister who had a  stroke, and one of his brothers had coronary artery bypass grafting.   SOCIAL HISTORY:  He is married, has two children, and is retired.  He has  smoked a pack a day for 20 year but quit in 1982 and occasionally drinks  alcohol.   REVIEW OF SYSTEMS:  Has had some chest tightness in the past prior to his  coronary artery bypass grafting but none since his surgery.  Denies any  anorexia, weight loss.  He has had no PND, orthopnea, or dyspnea on  exertion.  Also denies any productive cough, bronchitis, hemoptysis, asthma,  or wheezing.  He has several visual issues with essentially only peripheral  vision due to his macular degeneration.  He does have some urinary urgency  and dysuria but no hematuria.   PHYSICAL EXAMINATION:  VITAL SIGNS:  Blood pressure 130/70, heart rate 68,  respirations 16.  GENERAL:  He is a middle-aged male.  He is in no apparent distress.  He is  alert and oriented x 3.  NECK:  Supple.  Carotid pulses 3+.  There is a high pitched bruit over the  left carotid bifurcation.  NEUROLOGIC:  Normal with the exception of the diminished vision bilaterally.  There is no palpable adenopathy in the neck.  Extraocular muscles are  intact.  UPPER EXTREMITIES:  Pulses are 3+ bilaterally.  CHEST:  Clear to auscultation.  CARDIOVASCULAR:  Reveals  a regular rhythm.  No murmurs.  There is well  healed mediastinotomy incision in the chest.  ABDOMEN:  Soft nontender with no masses.  He has 3+ femoral pulses  bilaterally with well perfused lower extremities.  He has 2+ posterior  tibial pulses.   MEDICATIONS:  1.  Aspirin 81 mg a day.  2.  Doxazosin 8 mg per day.  3.  Folate 1 mg per day.  4.  Metoprolol 50 mg b.i.d.  5.  Lisinopril 40 mg b.i.d.  6.  Hydrochlorothiazide 25 mg daily.  7.  Lovastatin 40 mg q.h.s.  8.  Multivitamin.   IMPRESSION:  1.  Severe left internal carotid stenosis, asymptomatic.  2.  Coronary artery disease, status post coronary artery bypass grafting.  3.  Hypertension.  4.  Hyperlipidemia.  5.  Macular degeneration.  6.  Mild prostatic hypertrophy.   PLAN:  Admit the patient, on Apr 12, 2005, for an elective left carotid  endarterectomy.  Risks and benefits were discussed with the patient in the  office and he would like to proceed.                                         ___________________________________________  Rodney Christensen, M.D.    JDL/MEDQ  D:  04/09/2005  T:  04/09/2005  Job:  [id number]   cc:   Rodney Christensen, M.D.   Rodney Christensen, Dr.  Kathryne Christensen, Rodney Christensen

## 2011-04-22 ENCOUNTER — Other Ambulatory Visit: Payer: Self-pay | Admitting: Internal Medicine

## 2011-04-22 ENCOUNTER — Encounter (HOSPITAL_BASED_OUTPATIENT_CLINIC_OR_DEPARTMENT_OTHER): Payer: Medicare Other | Admitting: Internal Medicine

## 2011-04-22 DIAGNOSIS — Z5111 Encounter for antineoplastic chemotherapy: Secondary | ICD-10-CM

## 2011-04-22 DIAGNOSIS — C3430 Malignant neoplasm of lower lobe, unspecified bronchus or lung: Secondary | ICD-10-CM

## 2011-04-22 DIAGNOSIS — C3490 Malignant neoplasm of unspecified part of unspecified bronchus or lung: Secondary | ICD-10-CM

## 2011-04-22 LAB — CBC WITH DIFFERENTIAL/PLATELET
Basophils Absolute: 0 10*3/uL (ref 0.0–0.1)
EOS%: 2 % (ref 0.0–7.0)
HCT: 32.7 % — ABNORMAL LOW (ref 38.4–49.9)
HGB: 11.4 g/dL — ABNORMAL LOW (ref 13.0–17.1)
LYMPH%: 11.7 % — ABNORMAL LOW (ref 14.0–49.0)
MCH: 30.2 pg (ref 27.2–33.4)
MCV: 86.7 fL (ref 79.3–98.0)
MONO%: 10.7 % (ref 0.0–14.0)
NEUT%: 75.3 % — ABNORMAL HIGH (ref 39.0–75.0)
RDW: 14.2 % (ref 11.0–14.6)

## 2011-04-22 LAB — COMPREHENSIVE METABOLIC PANEL
Alkaline Phosphatase: 74 U/L (ref 39–117)
BUN: 8 mg/dL (ref 6–23)
Creatinine, Ser: 0.74 mg/dL (ref 0.40–1.50)
Glucose, Bld: 107 mg/dL — ABNORMAL HIGH (ref 70–99)
Total Bilirubin: 0.5 mg/dL (ref 0.3–1.2)

## 2011-05-17 ENCOUNTER — Encounter (HOSPITAL_COMMUNITY): Payer: Self-pay

## 2011-05-17 ENCOUNTER — Ambulatory Visit (HOSPITAL_COMMUNITY)
Admission: RE | Admit: 2011-05-17 | Discharge: 2011-05-17 | Disposition: A | Discharge status: Home / Self Care | Payer: Medicare Other | Source: Ambulatory Visit | Attending: Internal Medicine | Admitting: Internal Medicine

## 2011-05-17 DIAGNOSIS — C3430 Malignant neoplasm of lower lobe, unspecified bronchus or lung: Secondary | ICD-10-CM | POA: Insufficient documentation

## 2011-05-17 DIAGNOSIS — K8020 Calculus of gallbladder without cholecystitis without obstruction: Secondary | ICD-10-CM | POA: Insufficient documentation

## 2011-05-17 DIAGNOSIS — C3490 Malignant neoplasm of unspecified part of unspecified bronchus or lung: Secondary | ICD-10-CM

## 2011-05-17 DIAGNOSIS — I517 Cardiomegaly: Secondary | ICD-10-CM | POA: Insufficient documentation

## 2011-05-17 DIAGNOSIS — J438 Other emphysema: Secondary | ICD-10-CM | POA: Insufficient documentation

## 2011-05-17 DIAGNOSIS — K7689 Other specified diseases of liver: Secondary | ICD-10-CM | POA: Insufficient documentation

## 2011-05-17 DIAGNOSIS — K449 Diaphragmatic hernia without obstruction or gangrene: Secondary | ICD-10-CM | POA: Insufficient documentation

## 2011-05-17 DIAGNOSIS — J984 Other disorders of lung: Secondary | ICD-10-CM | POA: Insufficient documentation

## 2011-05-17 MED ORDER — IOHEXOL 300 MG/ML  SOLN
80.0000 mL | Freq: Once | INTRAMUSCULAR | Status: AC | PRN
  Administered 2011-05-17: 80 mL via INTRAVENOUS

## 2011-05-22 ENCOUNTER — Other Ambulatory Visit: Payer: Self-pay | Admitting: Internal Medicine

## 2011-05-22 ENCOUNTER — Encounter (HOSPITAL_BASED_OUTPATIENT_CLINIC_OR_DEPARTMENT_OTHER): Payer: Medicare Other | Admitting: Internal Medicine

## 2011-05-22 DIAGNOSIS — C3430 Malignant neoplasm of lower lobe, unspecified bronchus or lung: Secondary | ICD-10-CM

## 2011-05-22 LAB — CBC WITH DIFFERENTIAL/PLATELET
Basophils Absolute: 0 10*3/uL (ref 0.0–0.1)
Eosinophils Absolute: 0.1 10*3/uL (ref 0.0–0.5)
HGB: 11.4 g/dL — ABNORMAL LOW (ref 13.0–17.1)
LYMPH%: 16.3 % (ref 14.0–49.0)
MCV: 93.3 fL (ref 79.3–98.0)
MONO#: 0.5 10*3/uL (ref 0.1–0.9)
MONO%: 13.8 % (ref 0.0–14.0)
NEUT#: 2.4 10*3/uL (ref 1.5–6.5)
Platelets: 122 10*3/uL — ABNORMAL LOW (ref 140–400)
RDW: 17.6 % — ABNORMAL HIGH (ref 11.0–14.6)

## 2011-05-22 LAB — COMPREHENSIVE METABOLIC PANEL
AST: 16 U/L (ref 0–37)
Albumin: 4.3 g/dL (ref 3.5–5.2)
Alkaline Phosphatase: 62 U/L (ref 39–117)
BUN: 9 mg/dL (ref 6–23)
Potassium: 3.8 mEq/L (ref 3.5–5.3)
Sodium: 135 mEq/L (ref 135–145)

## 2011-05-24 ENCOUNTER — Ambulatory Visit
Admission: RE | Admit: 2011-05-24 | Discharge: 2011-05-24 | Disposition: A | Discharge status: Home / Self Care | Payer: Medicare Other | Source: Ambulatory Visit | Attending: Radiation Oncology | Admitting: Radiation Oncology

## 2011-05-30 ENCOUNTER — Ambulatory Visit (INDEPENDENT_AMBULATORY_CARE_PROVIDER_SITE_OTHER): Payer: Medicare Other | Admitting: Thoracic Surgery (Cardiothoracic Vascular Surgery)

## 2011-05-30 DIAGNOSIS — C3430 Malignant neoplasm of lower lobe, unspecified bronchus or lung: Secondary | ICD-10-CM

## 2011-05-31 NOTE — H&P (Signed)
HISTORY AND PHYSICAL EXAMINATION  May 30, 2011  Re:  Rodney Christensen, Rodney Christensen        DOB:  06/13/1941  REASON FOR VISIT:  Left lower lobe non-small cell carcinoma.  HISTORY OF PRESENT ILLNESS:  The patient is a 70 year old gentleman who was seen earlier this year with complaints of wheezing and shortness of breath with exertion and an episode of hemoptysis.  Chest x-ray and CT showed a left lower lobe mass.  A PET scan showed a left lower lobe mass with postobstructive atelectasis SUV max was 22.6.  He had bronchoscopy and this was positive for squamous cell carcinoma.  Biopsies of the carina of the left main stem as well as the left upper lobe origin were negative for tumor.  The patient underwent neoadjuvant chemo and radiation receiving 45 gray of chemotherapy was completed about 5-1/2 weeks ago and also received weekly carboplatin and paclitaxel, the last dose was given on Apr 08, 2011.  He had a good response.  He notes that he is not wheezing anymore but he still gets short of breath with exertion and that really has not changed dramatically since his treatment.  He did have to hold his last chemo treatment because of low counts but his wife says those have now recovered.  Overall, he feels pretty well.  PAST MEDICAL HISTORY:  Significant for: 1. 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 2. Coronary artery disease status post coronary artery bypass grafting     x5 in 2005. 3. Hypertension. 4. Hyperlipidemia. 5. Remote tobacco abuse. 6. History of melanoma. 7. Benign prostatic hypertrophy. 8. Macular degeneration. 9. Diverticulosis.  CURRENT MEDICATIONS: 1. Norvasc 5 mg daily. 2. Lopressor 50 mg b.i.d. 3. Cardura 8 mg daily. 4. Klor-Con 20 mEq daily. 5. Zocor 20 mg daily. 6. Folic acid 1 mg daily. 7. Betagan eye drops 0.5% both eyes daily. 8. Lumigan 0.01% both eyes daily. 9. Lisinopril 40 mg daily. 10.Demadex 20 mg daily.  ALLERGIES:   He has an allergy to [facility].  FAMILY HISTORY:  Significant for cardiovascular disease.  SOCIAL HISTORY:  A 25-pack year history of smoking.  He quit in 1982. He is married.  He is retired.  He lives with his wife.  REVIEW OF SYSTEMS:  Weight has been relatively stable, still he gets short of breath with exertion or breathes hard with exertion.  No hemoptysis.  Does have chronic visual changes.  No anginal-type symptoms.  PHYSICAL EXAMINATION:  The patient is a 70 year old gentleman in no acute distress.  Vital Signs:  Blood pressure is 135/66, pulse 64, respirations 20, oxygen saturation 97% on room air.  General:  He is an elderly, well-developed, and well-nourished.  Neurologic:  Alert, oriented x3 with no focal deficits.  HEENT:  Unremarkable except for alopecia.  Neck:  Supple without thyromegaly, adenopathy or bruits. Cardiac:  Regular rate and rhythm.  Normal S1-S2.  No murmurs, rubs or gallops.  Lungs:  Clear.  He does have breath sounds at the left base which were as previously.  Abdomen:  Soft, nontender.  There was no cervical, supraclavicular, axillary, or epitrochlear adenopathy.  LABORATORY DATA:  His CT of the chest done May 17, 2011, shows good response to chemotherapy.  The size of the primary mass has gone from about 7 to about 3 cm.  There is no mediastinal adenopathy, there is questionable hilar adenopathy.  IMPRESSION:  The patient is a 70 year old gentleman with a history of tobacco abuse who has a  squamous cell carcinoma of his left lower lobe, this is relatively central and there was concern about whether we would be able to resect that with lobectomy with adequate margins.  He was treated with neoadjuvant chemo and radiation which he has tolerated well.  He is now about 5-1/2 weeks out from his last radiation.  I discussed with the patient and his wife our options at this point, which would be to proceed with surgical resection hopefully with lobectomy  and possibly a pneumonectomy or to treat with additional chemo and radiation therapy.  I do feel that it is best chance for a cure with surgery although given the size and location of the tumor there is probably only about a 50% chance of long-term survival, however, that compares very favorably with chemo and radiation which would seldom give a complete cure in the setting.  I did discuss in detail with him the indications, risks, benefits and alternatives.  They understand the risks of surgery include but are not limited to death, myocardial infarction, deep vein thrombosis, pulmonary embolism, stroke, bleeding, need for transfusion, infection, prolonged air leak, cardiac arrhythmias, as well as other unforeseeable unpredictable complications and understand the expected hospital stay and overall recovery.  We also discussed the impact of lobectomy or pneumonectomy on his permanent pulmonary function.  The patient wishes to proceed with surgery.  We will schedule him for Tuesday, June 11, 2011.  Salvatore Decent Dorris Fetch, M.D. Electronically Signed  SCH/MEDQ  D:  05/30/2011  T:  05/31/2011  Job:  [id number]  cc:   [name], MD Jesse Sans. Daleen Squibb, MD, [facility] Lajuana Matte, M.D. Radene Gunning, M.D., Ph.D.

## 2011-06-07 ENCOUNTER — Encounter (HOSPITAL_COMMUNITY)
Admission: RE | Admit: 2011-06-07 | Discharge: 2011-06-07 | Disposition: A | Discharge status: Home / Self Care | Payer: Medicare Other | Source: Ambulatory Visit | Attending: Thoracic Surgery (Cardiothoracic Vascular Surgery) | Admitting: Thoracic Surgery (Cardiothoracic Vascular Surgery)

## 2011-06-07 ENCOUNTER — Other Ambulatory Visit: Payer: Self-pay | Admitting: Thoracic Surgery (Cardiothoracic Vascular Surgery)

## 2011-06-07 ENCOUNTER — Other Ambulatory Visit (HOSPITAL_COMMUNITY): Payer: Medicare Other

## 2011-06-07 DIAGNOSIS — Z01811 Encounter for preprocedural respiratory examination: Secondary | ICD-10-CM

## 2011-06-07 LAB — BLOOD GAS, ARTERIAL
Acid-Base Excess: 2.9 mmol/L — ABNORMAL HIGH (ref 0.0–2.0)
TCO2: 26.9 mmol/L (ref 0–100)
pCO2 arterial: 33.2 mmHg — ABNORMAL LOW (ref 35.0–45.0)

## 2011-06-07 LAB — CBC
HCT: 35.4 % — ABNORMAL LOW (ref 39.0–52.0)
Hemoglobin: 12.6 g/dL — ABNORMAL LOW (ref 13.0–17.0)
MCV: 90.5 fL (ref 78.0–100.0)
RBC: 3.91 MIL/uL — ABNORMAL LOW (ref 4.22–5.81)
WBC: 4.8 10*3/uL (ref 4.0–10.5)

## 2011-06-07 LAB — URINALYSIS, ROUTINE W REFLEX MICROSCOPIC
Bilirubin Urine: NEGATIVE
Glucose, UA: NEGATIVE mg/dL
Hgb urine dipstick: NEGATIVE
Specific Gravity, Urine: 1.015 (ref 1.005–1.030)
Urobilinogen, UA: 0.2 mg/dL (ref 0.0–1.0)
pH: 6 (ref 5.0–8.0)

## 2011-06-07 LAB — URINE MICROSCOPIC-ADD ON

## 2011-06-07 LAB — SURGICAL PCR SCREEN
MRSA, PCR: NEGATIVE
Staphylococcus aureus: NEGATIVE

## 2011-06-07 LAB — BASIC METABOLIC PANEL
Calcium: 9 mg/dL (ref 8.4–10.5)
Creatinine, Ser: 0.74 mg/dL (ref 0.50–1.35)
GFR calc Af Amer: >60 mL/min (ref >60)
GFR calc non Af Amer: >60 mL/min (ref >60)
Sodium: 137 mEq/L (ref 135–145)

## 2011-06-07 LAB — PROTIME-INR: INR: 0.91 (ref 0.00–1.49)

## 2011-06-10 ENCOUNTER — Telehealth: Payer: Self-pay | Admitting: Cardiology

## 2011-06-10 NOTE — Telephone Encounter (Signed)
[facility] faxed to [facility] @ [phone]  06/10/11/km

## 2011-06-11 ENCOUNTER — Inpatient Hospital Stay (HOSPITAL_COMMUNITY)
Admission: RE | Admit: 2011-06-11 | Discharge: 2011-06-17 | DRG: 164 | Disposition: A | Discharge status: Home / Self Care | Payer: Medicare Other | Source: Ambulatory Visit | Attending: Thoracic Surgery (Cardiothoracic Vascular Surgery) | Admitting: Thoracic Surgery (Cardiothoracic Vascular Surgery)

## 2011-06-11 ENCOUNTER — Other Ambulatory Visit: Payer: Self-pay | Admitting: Thoracic Surgery (Cardiothoracic Vascular Surgery)

## 2011-06-11 ENCOUNTER — Inpatient Hospital Stay (HOSPITAL_COMMUNITY): Payer: Medicare Other

## 2011-06-11 DIAGNOSIS — C3430 Malignant neoplasm of lower lobe, unspecified bronchus or lung: Secondary | ICD-10-CM

## 2011-06-11 DIAGNOSIS — Z01810 Encounter for preprocedural cardiovascular examination: Secondary | ICD-10-CM

## 2011-06-11 DIAGNOSIS — Z951 Presence of aortocoronary bypass graft: Secondary | ICD-10-CM

## 2011-06-11 DIAGNOSIS — K5730 Diverticulosis of large intestine without perforation or abscess without bleeding: Secondary | ICD-10-CM | POA: Diagnosis present

## 2011-06-11 DIAGNOSIS — D696 Thrombocytopenia, unspecified: Secondary | ICD-10-CM | POA: Diagnosis not present

## 2011-06-11 DIAGNOSIS — D62 Acute posthemorrhagic anemia: Secondary | ICD-10-CM | POA: Diagnosis not present

## 2011-06-11 DIAGNOSIS — Z79899 Other long term (current) drug therapy: Secondary | ICD-10-CM

## 2011-06-11 DIAGNOSIS — I2510 Atherosclerotic heart disease of native coronary artery without angina pectoris: Secondary | ICD-10-CM | POA: Diagnosis present

## 2011-06-11 DIAGNOSIS — Z87891 Personal history of nicotine dependence: Secondary | ICD-10-CM

## 2011-06-11 DIAGNOSIS — Z01812 Encounter for preprocedural laboratory examination: Secondary | ICD-10-CM

## 2011-06-11 DIAGNOSIS — H3530 Unspecified macular degeneration: Secondary | ICD-10-CM | POA: Diagnosis present

## 2011-06-11 DIAGNOSIS — Z01818 Encounter for other preprocedural examination: Secondary | ICD-10-CM

## 2011-06-11 DIAGNOSIS — Z923 Personal history of irradiation: Secondary | ICD-10-CM

## 2011-06-11 DIAGNOSIS — Z9221 Personal history of antineoplastic chemotherapy: Secondary | ICD-10-CM

## 2011-06-11 DIAGNOSIS — I10 Essential (primary) hypertension: Secondary | ICD-10-CM | POA: Diagnosis present

## 2011-06-11 DIAGNOSIS — E785 Hyperlipidemia, unspecified: Secondary | ICD-10-CM | POA: Diagnosis present

## 2011-06-11 DIAGNOSIS — N400 Enlarged prostate without lower urinary tract symptoms: Secondary | ICD-10-CM | POA: Diagnosis present

## 2011-06-11 DIAGNOSIS — Z7982 Long term (current) use of aspirin: Secondary | ICD-10-CM

## 2011-06-11 HISTORY — PX: BRONCHOSCOPY: SUR163

## 2011-06-11 HISTORY — PX: OTHER SURGICAL HISTORY: SHX169

## 2011-06-11 LAB — HEPATIC FUNCTION PANEL
Albumin: 3.5 g/dL (ref 3.5–5.2)
Alkaline Phosphatase: 68 U/L (ref 39–117)
Indirect Bilirubin: 0.5 mg/dL (ref 0.3–0.9)
Total Bilirubin: 0.6 mg/dL (ref 0.3–1.2)
Total Protein: 6 g/dL (ref 6.0–8.3)

## 2011-06-11 LAB — POCT I-STAT 7, (LYTES, BLD GAS, ICA,H+H)
Bicarbonate: 28.8 mEq/L — ABNORMAL HIGH (ref 20.0–24.0)
HCT: 27 % — ABNORMAL LOW (ref 39.0–52.0)
Hemoglobin: 9.2 g/dL — ABNORMAL LOW (ref 13.0–17.0)
Patient temperature: 96.6
Potassium: 3.8 mEq/L (ref 3.5–5.1)
Sodium: 138 mEq/L (ref 135–145)
TCO2: 30 mmol/L (ref 0–100)
pCO2 arterial: 46.2 mmHg — ABNORMAL HIGH (ref 35.0–45.0)
pH, Arterial: 7.399 (ref 7.350–7.450)

## 2011-06-11 LAB — POCT I-STAT 4, (NA,K, GLUC, HGB,HCT)
Hemoglobin: 9.5 g/dL — ABNORMAL LOW (ref 13.0–17.0)
Potassium: 3.8 mEq/L (ref 3.5–5.1)

## 2011-06-11 LAB — TYPE AND SCREEN
ABO/RH(D): A POS
Antibody Screen: POSITIVE

## 2011-06-12 ENCOUNTER — Inpatient Hospital Stay (HOSPITAL_COMMUNITY): Payer: Medicare Other

## 2011-06-12 LAB — POCT I-STAT 3, ART BLOOD GAS (G3+)
Bicarbonate: 27.3 mEq/L — ABNORMAL HIGH (ref 20.0–24.0)
TCO2: 29 mmol/L (ref 0–100)
pH, Arterial: 7.406 (ref 7.350–7.450)
pO2, Arterial: 75 mmHg — ABNORMAL LOW (ref 80.0–100.0)

## 2011-06-12 LAB — CBC
HCT: 34 % — ABNORMAL LOW (ref 39.0–52.0)
Hemoglobin: 12.4 g/dL — ABNORMAL LOW (ref 13.0–17.0)
MCV: 88.1 fL (ref 78.0–100.0)
RBC: 3.86 MIL/uL — ABNORMAL LOW (ref 4.22–5.81)
RDW: 14.6 % (ref 11.5–15.5)
WBC: 11.2 10*3/uL — ABNORMAL HIGH (ref 4.0–10.5)

## 2011-06-12 LAB — BASIC METABOLIC PANEL
BUN: 7 mg/dL (ref 6–23)
CO2: 26 mEq/L (ref 19–32)
Chloride: 103 mEq/L (ref 96–112)
Creatinine, Ser: 0.55 mg/dL (ref 0.50–1.35)
GFR calc Af Amer: >60 mL/min (ref >60)
Glucose, Bld: 177 mg/dL — ABNORMAL HIGH (ref 70–99)
Potassium: 3.5 mEq/L (ref 3.5–5.1)

## 2011-06-13 ENCOUNTER — Inpatient Hospital Stay (HOSPITAL_COMMUNITY): Payer: Medicare Other

## 2011-06-13 LAB — CBC
HCT: 32.4 % — ABNORMAL LOW (ref 39.0–52.0)
Hemoglobin: 11.5 g/dL — ABNORMAL LOW (ref 13.0–17.0)
MCHC: 35.5 g/dL (ref 30.0–36.0)
MCV: 90.5 fL (ref 78.0–100.0)

## 2011-06-13 LAB — COMPREHENSIVE METABOLIC PANEL
Alkaline Phosphatase: 59 U/L (ref 39–117)
BUN: 13 mg/dL (ref 6–23)
Creatinine, Ser: 0.69 mg/dL (ref 0.50–1.35)
GFR calc Af Amer: >60 mL/min (ref >60)
Glucose, Bld: 118 mg/dL — ABNORMAL HIGH (ref 70–99)
Potassium: 3.7 mEq/L (ref 3.5–5.1)
Total Bilirubin: 0.9 mg/dL (ref 0.3–1.2)
Total Protein: 6.2 g/dL (ref 6.0–8.3)

## 2011-06-13 LAB — TYPE AND SCREEN: Unit division: 0

## 2011-06-14 ENCOUNTER — Inpatient Hospital Stay (HOSPITAL_COMMUNITY): Payer: Medicare Other

## 2011-06-15 ENCOUNTER — Inpatient Hospital Stay (HOSPITAL_COMMUNITY): Payer: Medicare Other

## 2011-06-15 LAB — CBC
MCH: 32.2 pg (ref 26.0–34.0)
MCV: 90.7 fL (ref 78.0–100.0)
WBC: 9 10*3/uL (ref 4.0–10.5)

## 2011-06-15 LAB — BASIC METABOLIC PANEL
CO2: 29 mEq/L (ref 19–32)
Calcium: 8.9 mg/dL (ref 8.4–10.5)
Creatinine, Ser: 0.72 mg/dL (ref 0.50–1.35)
Glucose, Bld: 115 mg/dL — ABNORMAL HIGH (ref 70–99)

## 2011-06-16 ENCOUNTER — Inpatient Hospital Stay (HOSPITAL_COMMUNITY): Payer: Medicare Other

## 2011-06-17 ENCOUNTER — Inpatient Hospital Stay (HOSPITAL_COMMUNITY): Payer: Medicare Other

## 2011-06-24 ENCOUNTER — Encounter (INDEPENDENT_AMBULATORY_CARE_PROVIDER_SITE_OTHER): Payer: Self-pay

## 2011-06-24 DIAGNOSIS — C3490 Malignant neoplasm of unspecified part of unspecified bronchus or lung: Secondary | ICD-10-CM

## 2011-07-02 ENCOUNTER — Other Ambulatory Visit: Payer: Self-pay | Admitting: Thoracic Surgery (Cardiothoracic Vascular Surgery)

## 2011-07-02 DIAGNOSIS — C3430 Malignant neoplasm of lower lobe, unspecified bronchus or lung: Secondary | ICD-10-CM

## 2011-07-02 NOTE — Op Note (Signed)
Rodney Christensen, Rodney Christensen NO.:  [id number]  MEDICAL RECORD NO.:  [id number]  LOCATION:  2312                         FACILITY:  [facility]  PHYSICIAN:  Salvatore Decent. Dorris Fetch, M.D.DATE OF BIRTH:  1941-08-09  DATE OF PROCEDURE:  06/11/2011 DATE OF DISCHARGE:                              OPERATIVE REPORT   PREOPERATIVE DIAGNOSIS:  Squamous cell carcinoma, left lower lobe, status post chemotherapy and radiation therapy.  POSTOPERATIVE DIAGNOSIS:  Squamous cell carcinoma, left lower lobe, status post chemotherapy and radiation therapy.  PROCEDURES: 1. Bronchoscopy. 2. Left video-assisted thoracic surgery. 3. Left thoracotomy. 4. Left lower lobectomy with mediastinal node dissection. 5. Placement of On-Q local anesthetic catheter.  SURGEON:  Salvatore Decent. Dorris Fetch, MD  ASSISTANT:  Coral Ceo, PA  ANESTHESIA:  General.  FINDINGS:  Bronchoscopy, no endobronchial lesions noted.  Normal endobronchial anatomy.  Small pleural effusion, serous in appearance. Severe fibrotic reaction secondary to neoadjuvant chemo and radiation. Frozen section of level 11 node and bronchial margin negative for tumor.  CLINICAL NOTE:  Rodney Christensen is a 70 year old gentleman recently found to have a left lung mass.  This was diagnosed as squamous cell carcinoma. The patient was treated with neoadjuvant chemotherapy and radiation therapy and had a good response and now presents for surgical resection. The indications, risks, benefits, and alternatives were discussed in detail with the patient.  He understood and accepted the risk of the procedure and agreed to proceed.  OPERATIVE NOTE:  Rodney Christensen was brought to the preoperative holding area on June 11, 2011.  There, the Anesthesia Service placed an arterial blood pressure monitoring line and central venous access was established.  He was taken to the operating room, anesthetized, and intubated.  Intravenous antibiotics were administered.  PAS  hoses were placed for DVT prophylaxis.  Flexible fiberoptic bronchoscopy was performed via the endotracheal tube.  It revealed normal endobronchial anatomy and no endobronchial lesions to the level of the subsegmental bronchi.  A Foley catheter was placed.  The patient was reintubated with a double- lumen endotracheal tube.  He was placed in a right lateral decubitus position, and the left chest was prepped and draped in usual sterile fashion.  Single lung ventilation of the right lung was carried out and the patient tolerated this well throughout the procedure.  Incision was made approximately at the seventh intercostal space in the midaxillary line and was carried through the skin and subcutaneous tissue.  The chest was entered bluntly using a hemostat.  The thoracoscope was inserted.  A small serous pleural effusion, this was sent for cytology. There were some mild adhesions from the superior segment of the lower lobe to the upper lobe.  These were taken down without difficulty. There were adhesions of the upper lobe to the previous left internal mammary site.  These adhesions were not taken down.  A small utility thoracotomy was made dividing the latissimus preserving the serratus muscle, and initial dissection was started with video-assisted approach. It was clear there was significant scarring and foreshortening of the tissues and this would require a full open thoracotomy.  A posterolateral thoracotomy was performed with sparing of the serratus muscle.  Retractor was  placed and gradually opened overtime.  It was clear that more exposure was needed and the superior rib was shingled to improve exposure.  The inferior pulmonary ligament was divided up to the level of the inferior pulmonary vein.  Level 8 and 9 nodes were encountered and were sent as separate specimens.  The pleural reflection then was divided circumferentially around the hilum with the exception of the medial aspect  of the upper lobe where the upper lobe was tethered by adhesions to the mediastinum.  There was significant edema in the perihilar tissues as well as in the fissure.  The fissure was relatively complete and the dissection was carried out in the fissure to expose the pulmonary arterial branches.  All of these steps took a considerable period of time due to adhesion as well as foreshortening of the tissues. During the dissection of the fissure, there was a level 11 node encountered.  There was intense fibrotic response around it.  If this node was positive, it would have necessitated a pneumonectomy as it was densely adherent to the pulmonary artery.  This node was shaved off the artery and sent for frozen section.  Dissection was continued while awaiting the results of the frozen section.  The inferior pulmonary vein was divided with an Endo-GIA stapler.  This exposed the lower lobe bronchus which was carefully dissected out.  There were dense adhesions in the vicinity of the pulmonary artery takeoff of the lower lobe branches.  This dissection was quite tedious and prolonged.  There was intense scarring and thickening around the pulmonary arterial branches. A tear in the pulmonary artery was repaired with 4-0 Prolene figure-of- eight suture followed by a 6-0 Prolene suture.  At this point, an Endo- GIA stapler was placed across the left lower lobe bronchus, flushed with the takeoff of the upper lobe and closed.  A test inflation showed good aeration of the upper lobe with no aeration of the lower lobe.  The stapler then was fired, and the bronchus divided.  A clamp was placed across the pulmonary arterial branches to the lower lobe.  The arteries were then cut on the lower lobe site of the clamp and the specimen was removed.  A 2-0 silk suture ligature was placed at the site of the branches but did slip with removal of the clamp.  There was significant bleeding which was quickly controlled  by placing a right-angle clamp across the stump of the branches.  A 4-0 Prolene suture then was used to repair the artery.  Frozen section of the level 11 node as well as the bronchial margin subsequently returned negative for tumor.  Additional nodes were sent as separate specimen including multiple level 10 and 11 nodes and level 7 nodes.  Chest was copiously irrigated with warm saline.  An On-Q local anesthetic catheter was placed through a separate stab incision and directed posteriorly and superiorly in a subpleural fashion.  After ensuring hemostasis, a 28-French chest tube was placed through an anterior port incision and directed anteriorly and superiorly.  A 36-French right-angle chest tube was placed posteriorly, both were secured with #1 silk sutures.  The upper lobe was then reinflated.  The ribs were reapproximated with #2 Vicryl pericostal sutures.  The serratus was reattached to its lateral attachments with a running 0 Vicryl suture.  The latissimus fascia was reapproximated with a running 0 Vicryl suture. The subcutaneous tissue and skin were closed in standard fashion.  All sponge, needle, and instrument counts were  correct at the end of the procedure.  The patient was extubated in the operating room and taken to the postanesthetic care unit in good condition.     Salvatore Decent Dorris Fetch, M.D.     SCH/MEDQ  D:  06/11/2011  T:  06/12/2011  Job:  [id number]  cc:   [name], MD Jesse Sans. Daleen Squibb, MD, [facility] Lajuana Matte, M.D. Radene Gunning, M.D., Ph.D.  Electronically Signed by Charlett Lango M.D. on 07/02/2011 01:31:30 PM

## 2011-07-02 NOTE — Discharge Summary (Signed)
Rodney Christensen, Rodney Christensen NO.:  [id number]  MEDICAL RECORD NO.:  [id number]  LOCATION:  3315                         FACILITY:  [facility]  PHYSICIAN:  Salvatore Decent. Dorris Fetch, M.D.DATE OF BIRTH:  01/10/1941  DATE OF ADMISSION:  06/11/2011 DATE OF DISCHARGE:  06/17/2011                              DISCHARGE SUMMARY   FINAL DIAGNOSIS:  Squamous cell carcinoma, left lower lobe status post chemotherapy and radiation therapy.  Pathology report changes consistent with chemoradiation treatment.  Three hilar lymph nodes with necrosis and changes consistent with chemoradiation.  No evidence of carcinoma and lymph node biopsy.  IN-HOSPITAL DIAGNOSIS:  Acute blood loss anemia.  SECONDARY DIAGNOSES: 1. 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 2. Coronary artery disease status post coronary artery bypass grafting     x5 in 2005. 3. Hypertension. 4. Hyperlipidemia. 5. Remote tobacco abuse. 6. History of melanoma. 7. Benign prostatic hypertrophy. 8. Macular degeneration. 9. Diverticulosis.  IN-HOSPITAL OPERATIONS AND PROCEDURES:  Bronchoscopy with left video- assisted thoracoscopic surgery, left thoracotomy with left lower lobectomy with mediastinal node dissection.  HISTORY AND PHYSICAL AND HOSPITAL COURSE:  Rodney Christensen is a 70 year old gentleman recently found to have a left lung mass.  This is diagnosed as squamous cell carcinoma.  This is noted to be stage 2B, T3 N0.  The patient was treated with neoadjuvant chemotherapy and radiation therapy with good response.  He was seen by Dr. Dorris Fetch for surgical resection.  Dr. Dorris Fetch saw and evaluated the patient.  He discussed with the patient undergoing a left lower lobectomy.  He discussed risks and benefits with the patient.  The patient acknowledged understanding and agreed to proceed.  Surgery was scheduled for June 11, 2011.  For further details of the patient's past medical history and  physical exam, please see dictated H and P.  The patient was taken to the operating room on June 11, 2011, where he underwent bronchoscopy with left video-assisted thoracoscopic surgery, left thoracotomy, left lower lobectomy with mediastinal node dissection. The patient's pathology report came back showing that the left lower lobe changes consistent with chemoradiation treatment with 3 hilar lymph nodes with necrosis and changes consistent with chemoradiation.  Viable malignancy not identified.  Lymph node biopsy noted no evidence of carcinoma.  The pleural fluid was sent and no malignant cells were noted.  The patient tolerated this procedure well and was transferred to the intensive care unit in stable condition.  He is able to be extubated following surgery.  Postextubation, he was noted to be alert and oriented x4.  Neuro intact.  He was noted to be hemodynamically stable postoperatively.  During the patient's postoperative course, daily chest x-rays were obtained.  These slowly improved.  The patient's drainage of his chest tubes were followed closely.  One chest tube was discontinued on postop day #2.  Remaining chest tubes stayed in for several days secondary to chest tube drainage.  On postop day #5, drainage had decreased.  His chest x-ray showed a persistent left basilar pneumothorax.  This was noted to be small in size.  Remaining chest tube was discontinued on postop day #5.  Followup  chest x-ray on June 17, 2011, remained stable with a small left apical pneumothorax.  During this time, the patient was encouraged to use his incentive spirometer. He is able to be weaned off oxygen with O2 saturations maintaining greater than 90% on room air.  The patient's vital signs were followed closely during this time.  He remained afebrile, in normal sinus rhythm. Blood pressure stable.  He did develop mild acute blood loss anemia. Hemoglobin and hematocrit were followed closely.  The  patient did not require any blood transfusions postoperatively.  He was then up ambulating well with assistance.  He is tolerating diet well.  No nausea, vomiting noted.  All incisions were noted to be clean, dry, and intact, and healing well.  On June 17, 2011, the patient's vital signs noted to be stable.  Lab work shows sodium of 133, potassium 3.9, chloride of 96, bicarbonate 29, BUN of 12, creatinine 0.72, glucose 115.  White blood cell count 9.3, hemoglobin of 11.4, hematocrit 32.1, platelet count 112.  The patient is tentatively ready for discharge home this afternoon pending evaluation by MD.  Elenor Quinones APPOINTMENTS:  A followup appointment has been arranged with Dr. Dorris Fetch for July 03, 2011, at 12:45 p.m.  The patient will need to obtain PA and lateral chest x-ray 45 minutes prior to this appointment.  An appointment has been made for suture removal on June 24, 2011, at 9:30 a.m.Marland Kitchen  ACTIVITY:  The patient was instructed no driving until released to do so, no lifting over 10 pounds.  He is told to ambulate 3-4 times per day, progress as tolerated, and continue his breathing exercises.INCISIONAL CARE:  The patient is told to shower using a soap and water. He is to contact the office if he develops any drainage or opening from any of his incision sites.  DIET:  The patient is educated on diet to be low fat, low salt.  DISCHARGE MEDICATIONS: 1. Vicodin 5/325, 1-2 tabs q.4 hours p.r.n. pain. 2. Norvasc 5 mg daily. 3. Enteric-coated aspirin 81 mg daily. 4. Beta carotene 25,000 units daily. 5. Bilberry 1000 mg daily. 6. Calcium/magnesium/zinc b.i.d. 7. Chromium picolinate  400 mg daily. 8. Cinnamon OTC daily. 9. Doxazosin 8 mg daily. 10.Fish oil 2000 mg 2 in the a.m., 1 at noon, and 1 in the evening. 11.Folic acid 1 mg daily. 12.Garlic OTC daily. 13.Ginkgo biloba 60 mg daily. 14.Levobunolol ophthalmic drops 0.5% 1 drop both eyes in the a.m. 15.Lisinopril 40 mg  b.i.d. 16.Lumigan 0.01% both eyes daily. 17.Metoprolol 50 mg b.i.d. 18.Multivitamin daily. 19.Ocuvite 1 tablet b.i.d. 20.Potassium chloride 20 mEq daily. 21.Simvastatin 20 mg daily. 22.Torsemide 20 mg daily. 23.Vitamin B complex daily. 24.Vitamin C daily. 25.Vitamin D 2000 units daily. 26.Zinc 50 mg daily.     Sol Blazing, PA   ______________________________ Salvatore Decent Dorris Fetch, M.D.    KMD/MEDQ  D:  06/17/2011  T:  06/17/2011  Job:  [id number]  cc:   [name], MD Jesse Sans. Daleen Squibb, MD, [facility] Lajuana Matte, M.D. Radene Gunning, M.D., Ph.D.  Electronically Signed by Cameron Proud PA on 06/18/2011 11:08:20 AM Electronically Signed by Charlett Lango M.D. on 07/02/2011 01:31:20 PM

## 2011-07-03 ENCOUNTER — Ambulatory Visit (INDEPENDENT_AMBULATORY_CARE_PROVIDER_SITE_OTHER): Payer: Self-pay | Admitting: Thoracic Surgery (Cardiothoracic Vascular Surgery)

## 2011-07-03 ENCOUNTER — Ambulatory Visit
Admission: RE | Admit: 2011-07-03 | Discharge: 2011-07-03 | Disposition: A | Discharge status: Home / Self Care | Payer: Medicare Other | Source: Ambulatory Visit | Attending: Thoracic Surgery (Cardiothoracic Vascular Surgery) | Admitting: Thoracic Surgery (Cardiothoracic Vascular Surgery)

## 2011-07-03 DIAGNOSIS — C3490 Malignant neoplasm of unspecified part of unspecified bronchus or lung: Secondary | ICD-10-CM

## 2011-07-03 DIAGNOSIS — C3430 Malignant neoplasm of lower lobe, unspecified bronchus or lung: Secondary | ICD-10-CM

## 2011-07-03 NOTE — Assessment & Plan Note (Signed)
OFFICE VISIT  URIE, LOUGHNER DOB:  January 06, 1941                                        July 03, 2011 CHART #:  [id number]  The patient is a 70 year old gentleman who was recently found to have a left lung masses with a stage 2B, T3, N0 squamous cell cancer.  He was treated with chemotherapy and radiation and had a good response.  We then did a left lower lobectomy 3 weeks ago and he did well from that postoperatively with no major complications.  At the time of surgery, all of his remaining pathology showed a complete response to the chemo and radiation.  The patient states he was having some difficulty with the pain with hydrocodone and increase in hydrocodone caused a great deal of nausea, we switched him to Ultram.  He has taken a couple of those before he goes to bed at night and that is helping and he is not having the adverse effects with that.  He says he just feels kind of weak and washed out occasional cough, nonproductive.  Denies any fevers or chills.  His current medications are: 1. Norvasc 5 mg daily. 2. Lopressor 50 mg b.i.d. 3. Aspirin 81 mg daily. 4. Zocor 20 mg daily. 5. Cardura 8 mg daily. 6. Folic acid 1 mg daily. 7. Betagan eye drops 0.5% both eyes q.a.m. 8. Lisinopril 40 mg b.i.d. 9. Lumigan eye drops 0.01% both eyes daily. 10.Demadex 20 mg daily. 11.Beta-carotene 25,000 units daily. 12.Bilberry of 1000 mg daily. 13.Calcium, magnesium, zinc b.i.d. 14.Chromium 400 mg daily. 15.Cinnamon. 16.Fish oil 2000 mg b.i.d. 17.Ginkgo biloba 60 mg daily. 18.Multivitamin one daily. 19.Ocuvite one b.i.d. 20.Vitamin B, C, and D.  PHYSICAL EXAMINATION:  General:  The patient is a 70 year old gentleman in no acute distress.  Vital Signs:  Blood pressure 119/69, pulse 70, respirations 16, ox saturation 96% on room air.  Lungs:  He has diminished breath sounds at the left base and some more bronchial-type breath sounds in the left upper lung  fields.  His incisions are well healed.  Cardiac:  Has regular rate and rhythm.  Normal S1 and S2.  His chest x-ray has not yet been officially read, but there is a more loculated fluid laterally and also has some haziness consistent with atelectasis or infiltrate in the left lingular area, which is more pronounced when he was discharged from the hospital.  IMPRESSION:  The patient is a 70 year old gentleman.  He is status post left lower lobectomy following neoadjuvant chemo and radiation 3 weeks ago.  He is little frustrated with his progress, although amazed with his progress considering all has been through in the past couple of months including major operation on the heels of chemotherapy, I would not expect him to be feeling particularly well at this point in time.  I am concerned about the appearance of his chest x-ray that was particularly with a lingular infiltrate and some fluid.  I recommended to him that we treating with course of antibiotics in case this represents an early pneumonia and given prescription for Cipro 500 mg p.o. b.i.d.  He still has Ultram for pain.  I am going to plan to seeing back in about 2 weeks with repeat chest x-ray to check on his progress. He knows to call if he has any issues in the meantime.  Salvatore Decent  Dorris Fetch, M.D. Electronically Signed  SCH/MEDQ  D:  07/03/2011  T:  07/03/2011  Job:  [id number]  cc:   [name], MD Jesse Sans. Daleen Squibb, MD, [facility] Lajuana Matte, M.D. Radene Gunning, M.D., Ph.D.

## 2011-07-08 ENCOUNTER — Encounter: Payer: Medicare Other | Admitting: Thoracic Surgery (Cardiothoracic Vascular Surgery)

## 2011-07-12 ENCOUNTER — Other Ambulatory Visit: Payer: Self-pay | Admitting: Thoracic Surgery (Cardiothoracic Vascular Surgery)

## 2011-07-12 DIAGNOSIS — C3430 Malignant neoplasm of lower lobe, unspecified bronchus or lung: Secondary | ICD-10-CM

## 2011-07-15 ENCOUNTER — Ambulatory Visit
Admission: RE | Admit: 2011-07-15 | Discharge: 2011-07-15 | Disposition: A | Discharge status: Home / Self Care | Payer: Medicare Other | Source: Ambulatory Visit | Attending: Thoracic Surgery (Cardiothoracic Vascular Surgery) | Admitting: Thoracic Surgery (Cardiothoracic Vascular Surgery)

## 2011-07-15 ENCOUNTER — Ambulatory Visit (INDEPENDENT_AMBULATORY_CARE_PROVIDER_SITE_OTHER): Payer: Self-pay | Admitting: Thoracic Surgery (Cardiothoracic Vascular Surgery)

## 2011-07-15 DIAGNOSIS — C3430 Malignant neoplasm of lower lobe, unspecified bronchus or lung: Secondary | ICD-10-CM

## 2011-07-15 DIAGNOSIS — C3490 Malignant neoplasm of unspecified part of unspecified bronchus or lung: Secondary | ICD-10-CM

## 2011-07-16 NOTE — Assessment & Plan Note (Signed)
OFFICE VISIT  Rodney Christensen, Rodney Christensen DOB:  [birth year]                                        July 15, 2011 CHART #:  [id number]  The patient is a 70 year old gentleman who had a left lower lobectomy following neoadjuvant chemo and radiation for squamous cell carcinoma of the left lower lobe back on June 11, 2011.  He was seen in the office on July 03, 2011, at which time he was having a bit of a cough, and there was a question of a lingular infiltrate.  He was treated with Cipro.  He completed that course of antibiotics, says he feels a little better than he did at his last visit.  He still has some pain at his incisions but denies any shortness of breath.  He just says he does not have a lot of energy.  He usually has taken nap in the afternoon.  When I saw him last time, he was somewhat frustrated with his progress, but I was thrilled with his progress and tried to give him some more realistic expectations.  Medications are unchanged since his last visit from July 03, 2011.  PHYSICAL EXAMINATION:  General/Vital Signs:  The patient is a 70 year old gentleman in no acute distress.  Blood pressure is 111/65, pulse 70, respirations are 18.  His oxygen saturation is 96% on room air.  Skin: His incisions are healing well.  He is tender to palpation on the anterior costal margin on the left side in the area referred from the intercostal nerves.  Chest:  Lungs have diminished breath sounds at the left base but otherwise are clear.  LABORATORY DATA:  His chest x-ray shows slight improvement at the area of infiltrate in the lingula.  There is some pleural fluid laterally which should be expected with a resection in the setting.  IMPRESSION:  The patient is doing really remarkably well a month out from surgery on the heels of preoperative chemo and radiation.  I am really delighted with his progress.  I think his exercise tolerance is as good as you could hope for,  not surprise at all that he is still having some pain and I am not surprised all that he has kind of a generalized lack of energy and is taking some naps during the day and things of that nature.  I told him that this was all totally to be expected with this type of treatment that he has been through over the past 4 or 5 months.  I encouraged Mr. Luis to remain patient and to try to continue to gradually increase his exercise.  I did caution him that he could expect to still have some pain really for several months, which would not be out of the ordinary at all.  I am going to plan to see him back in 2 months that will be his 60-month followup visit.  We will do a CT of the chest at that time I tell him to arrange a followup appointment with Dr. Arbutus Ped in 3-4 weeks just to check on his progress, and we will plan to see him back every 3 months or so for the first couple of years and then hopefully can start spreading out after that if he is still doing well.  Salvatore Decent Dorris Fetch, M.D. Electronically Signed  SCH/MEDQ  D:  07/15/2011  T:  07/16/2011  Job:  [id number]  cc:   [name], MD Jesse Sans. Daleen Squibb, MD, [facility] Lajuana Matte, M.D. Radene Gunning, M.D., Ph.D.

## 2011-07-31 ENCOUNTER — Other Ambulatory Visit: Payer: Self-pay | Admitting: Thoracic Surgery (Cardiothoracic Vascular Surgery)

## 2011-08-15 ENCOUNTER — Other Ambulatory Visit: Payer: Self-pay | Admitting: Thoracic Surgery (Cardiothoracic Vascular Surgery)

## 2011-08-15 ENCOUNTER — Encounter: Payer: Self-pay | Admitting: *Deleted

## 2011-08-22 ENCOUNTER — Other Ambulatory Visit: Payer: Self-pay | Admitting: Thoracic Surgery (Cardiothoracic Vascular Surgery)

## 2011-08-22 DIAGNOSIS — C3490 Malignant neoplasm of unspecified part of unspecified bronchus or lung: Secondary | ICD-10-CM

## 2011-08-30 ENCOUNTER — Ambulatory Visit
Admission: RE | Admit: 2011-08-30 | Discharge: 2011-08-30 | Disposition: A | Discharge status: Home / Self Care | Payer: Medicare Other | Source: Ambulatory Visit | Attending: Radiation Oncology | Admitting: Radiation Oncology

## 2011-09-03 ENCOUNTER — Other Ambulatory Visit: Payer: Self-pay | Admitting: Thoracic Surgery (Cardiothoracic Vascular Surgery)

## 2011-09-09 ENCOUNTER — Encounter (HOSPITAL_BASED_OUTPATIENT_CLINIC_OR_DEPARTMENT_OTHER): Payer: Medicare Other | Admitting: Internal Medicine

## 2011-09-09 ENCOUNTER — Other Ambulatory Visit: Payer: Self-pay | Admitting: Internal Medicine

## 2011-09-09 DIAGNOSIS — Z5111 Encounter for antineoplastic chemotherapy: Secondary | ICD-10-CM

## 2011-09-09 DIAGNOSIS — C3490 Malignant neoplasm of unspecified part of unspecified bronchus or lung: Secondary | ICD-10-CM

## 2011-09-09 DIAGNOSIS — C3430 Malignant neoplasm of lower lobe, unspecified bronchus or lung: Secondary | ICD-10-CM

## 2011-09-09 LAB — CBC WITH DIFFERENTIAL/PLATELET
Eosinophils Absolute: 0.2 10*3/uL (ref 0.0–0.5)
LYMPH%: 17.7 % (ref 14.0–49.0)
MCHC: 34.4 g/dL (ref 32.0–36.0)
MCV: 91.2 fL (ref 79.3–98.0)
MONO%: 8.7 % (ref 0.0–14.0)
NEUT#: 3.7 10*3/uL (ref 1.5–6.5)
NEUT%: 69.4 % (ref 39.0–75.0)
Platelets: 139 10*3/uL — ABNORMAL LOW (ref 140–400)
RBC: 4.06 10*6/uL — ABNORMAL LOW (ref 4.20–5.82)

## 2011-09-09 LAB — COMPREHENSIVE METABOLIC PANEL
Alkaline Phosphatase: 73 U/L (ref 39–117)
Creatinine, Ser: 0.86 mg/dL (ref 0.50–1.35)
Glucose, Bld: 134 mg/dL — ABNORMAL HIGH (ref 70–99)
Sodium: 139 mEq/L (ref 135–145)
Total Bilirubin: 0.4 mg/dL (ref 0.3–1.2)
Total Protein: 6.5 g/dL (ref 6.0–8.3)

## 2011-09-16 DIAGNOSIS — C3490 Malignant neoplasm of unspecified part of unspecified bronchus or lung: Secondary | ICD-10-CM | POA: Insufficient documentation

## 2011-09-17 ENCOUNTER — Encounter: Payer: Medicare Other | Admitting: Thoracic Surgery (Cardiothoracic Vascular Surgery)

## 2011-09-17 ENCOUNTER — Encounter: Payer: Self-pay | Admitting: Thoracic Surgery (Cardiothoracic Vascular Surgery)

## 2011-09-17 ENCOUNTER — Ambulatory Visit (INDEPENDENT_AMBULATORY_CARE_PROVIDER_SITE_OTHER): Payer: Medicare Other | Admitting: Thoracic Surgery (Cardiothoracic Vascular Surgery)

## 2011-09-17 ENCOUNTER — Ambulatory Visit
Admission: RE | Admit: 2011-09-17 | Discharge: 2011-09-17 | Disposition: A | Discharge status: Home / Self Care | Payer: Medicare Other | Source: Ambulatory Visit | Attending: Thoracic Surgery (Cardiothoracic Vascular Surgery) | Admitting: Thoracic Surgery (Cardiothoracic Vascular Surgery)

## 2011-09-17 VITALS — BP 127/73 | HR 56 | Resp 16 | Ht 66.0 in | Wt 176.0 lb

## 2011-09-17 DIAGNOSIS — C3490 Malignant neoplasm of unspecified part of unspecified bronchus or lung: Secondary | ICD-10-CM

## 2011-09-17 NOTE — Progress Notes (Signed)
PCP is No primary provider on file. Referring Provider is Lajuana Matte., MD  Chief Complaint  Patient presents with  . 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 He was treated with chemotherapy and radiation therapy and had an excellent response. He then underwent a left thoracotomy and left lower lobectomy with mediastinal node dissection on July 10. He was last in the office on August 13 at which time he had been treated with ciprofloxacin for lingular infiltrate. He now returns for a scheduled followup visit 3 months from resection.  History and states that he feels well, he has an occasional cough that sometimes will clear mucus from the back of his throat, but is otherwise unproductive. He denies hemoptysis, fevers, chills and night sweats. He does still have some pain in his rib cage on the left side was taken hydrocodone for he goes to bed at night for that. He also has noted some stiffness and soreness in his left shoulder when he raises his left arm above his head.  Past Medical History  Diagnosis Date  . CAD, NATIVE VESSEL 03/22/2010  . 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  . HYPERTENSION, UNSPECIFIED 03/17/2009  . HYPERLIPIDEMIA-MIXED 03/17/2009  . Squamous cell carcinoma lung     Left lower lobe     Past Surgical History  Procedure Date  . Coronary artery bypass graft 10/10/2004    x5  . Carotid endarterectomy   . Bronchoscopy 06/11/11    Jerline Linzy  . Lt vats,lt thoacotomy,lt lower lobectomy with  mediastinal node  dissection 06/11/11    [facility]    No family history on file.  Social History History  Substance Use Topics  . Smoking status: Former Smoker    Quit date: 09/14/1981  . Smokeless tobacco: Not on file  . Alcohol Use: Yes    Current Outpatient Prescriptions  Medication Sig Dispense Refill  . amLODipine (NORVASC) 5 MG tablet  Take 5 mg by mouth daily.        . Ascorbic Acid (VITAMIN C) 500 MG tablet Take 1,000 mg by mouth daily.       Marland Kitchen aspirin 81 MG tablet Take 81 mg by mouth daily.        . B Complex Vitamins (B COMPLEX 100 PO) Take by mouth daily.        . beta carotene 40981 UNIT capsule Take 25,000 Units by mouth daily.        . Bilberry 500 MG CAPS Take 1,000 mg by mouth daily.       . bimatoprost (LUMIGAN) 0.03 % ophthalmic drops Place 1 drop into both eyes at bedtime.        . Calcium-Magnesium-Zinc (CAL-MAG-ZINC PO) Take by mouth as directed.        . Chelated Zinc 50 MG TABS Take by mouth daily.        . Chromium Picolinate 500 MCG TABS Take by mouth daily.        . Cinnamon 500 MG capsule Take 1,000 mg by mouth 2 (two) times daily.       . Coenzyme Q10 (CO Q 10) 100 MG CAPS Take by mouth daily.        Marland Kitchen doxazosin (CARDURA) 8 MG tablet Take 8 mg by mouth at bedtime.        . fish oil-omega-3 fatty acids  1000 MG capsule Take 2 g by mouth 3 (three) times daily.        . folic acid (FOLVITE) 1 MG tablet Take 1 mg by mouth daily.        . Garlic 1000 MG CAPS Take by mouth daily.        . Ginkgo Biloba (GINKOBA) 40 MG TABS Take by mouth daily.        Marland Kitchen levobunolol (BETAGAN) 0.5 % ophthalmic solution       . lisinopril (PRINIVIL,ZESTRIL) 40 MG tablet Take 40 mg by mouth 2 (two) times daily.       . LUTEIN PO Take by mouth 2 (two) times daily.        . Melatonin 1 MG CAPS Take by mouth at bedtime.        . metoprolol (LOPRESSOR) 50 MG tablet Take 50 mg by mouth 2 (two) times daily.        . Multiple Vitamin (MULTIVITAMIN) tablet Take 1 tablet by mouth daily.        . potassium chloride SA (K-DUR,KLOR-CON) 20 MEQ tablet       . simvastatin (ZOCOR) 80 MG tablet Take 20 mg by mouth at bedtime.       . torsemide (DEMADEX) 20 MG tablet       . traMADol (ULTRAM) 50 MG tablet TAKE 1-2 TABLETS BY MOUTH EVERY 4-6 HOURS AS NEEDED FOR PAIN  40 tablet  0  . FLUVIRIN INJ       . HYDROcodone-acetaminophen (LORTAB) 10-500  MG per tablet         Allergies  Allergen Reactions  . Cephalexin     REACTION: Hives  . Latanoprost     REACTION: Reaction not known.    Review of Systems: See history of present illness, otherwise all systems negative  BP 127/73  Pulse 56  Resp 16  Ht 5\' 6"  (1.676 m)  Wt 176 lb (79.833 kg)  BMI 28.41 kg/m2  SpO2 96% Physical Exam: 70 year old white male in no acute distress HEENT alopecia improving, otherwise unremarkable Lungs decreased breath sounds at left base no rales or wheezing Cardiac exam regular rate and rhythm normal S1 and S2 No cervical, supraclavicular, axillary or epitrochlear adenopathy Thoracotomy wound and chest tube sites well-healed  Diagnostic Tests: CT of the chest shows parenchymal opacity with air bronchograms at the base of the left upper lobe, followed by radiology most consistent with atelectasis and/or pneumonia. I suspect this represents radiation changes in this location adjacent to the resected left lower lobe. Vague opacity periphery right upper lobe. Incidental gallstones  Impression: Rodney Christensen is 70 year old gentleman he is in now about 3 months out from a left lower lobectomy following neoadjuvant chemotherapy and radiation therapy, he is doing about as well as all expect at this point in time. He does still have some discomfort which is not usual with this large operation. He is not having any shortness of breath, coughing, wheezing or sputum production or fevers or chills that would suggest that the parenchymal opacity is an active infection. Almost certainly represents post radiation changes, there is nothing to suggest recurrent cancer particularly given that it was present in the early postoperative period.  Plan: Rodney Christensen is to have a CT scan which Dr. Gwenyth Bouillon will schedule, this to be done in January. I will plan to see him back in the office at that scan is done. After that visit we should be able to get on a rotating schedule  since  seeing Dr. Gwenyth Bouillon and myself at each followup interval  No medication changes were made at this visit.  History and difficulty was experienced any difficulties with his breathing, worsening cough, hemoptysis or other concerning symptoms

## 2011-09-30 ENCOUNTER — Other Ambulatory Visit: Payer: Self-pay | Admitting: Thoracic Surgery (Cardiothoracic Vascular Surgery)

## 2011-11-04 ENCOUNTER — Other Ambulatory Visit: Payer: Self-pay | Admitting: Internal Medicine

## 2011-11-04 ENCOUNTER — Telehealth: Payer: Self-pay | Admitting: Internal Medicine

## 2011-11-04 DIAGNOSIS — C3490 Malignant neoplasm of unspecified part of unspecified bronchus or lung: Secondary | ICD-10-CM

## 2011-11-04 NOTE — Telephone Encounter (Signed)
gve the pt's wife the jan 2013 appt calendar along with the ct scan appt °

## 2011-11-11 ENCOUNTER — Other Ambulatory Visit: Payer: Self-pay | Admitting: Thoracic Surgery (Cardiothoracic Vascular Surgery)

## 2011-12-17 ENCOUNTER — Other Ambulatory Visit: Payer: Self-pay | Admitting: Thoracic Surgery (Cardiothoracic Vascular Surgery)

## 2011-12-19 ENCOUNTER — Other Ambulatory Visit: Payer: Self-pay | Admitting: Thoracic Surgery (Cardiothoracic Vascular Surgery)

## 2011-12-20 ENCOUNTER — Ambulatory Visit (HOSPITAL_COMMUNITY)
Admission: RE | Admit: 2011-12-20 | Discharge: 2011-12-20 | Disposition: A | Discharge status: Home / Self Care | Payer: Medicare Other | Source: Ambulatory Visit | Attending: Internal Medicine | Admitting: Internal Medicine

## 2011-12-20 ENCOUNTER — Other Ambulatory Visit (HOSPITAL_BASED_OUTPATIENT_CLINIC_OR_DEPARTMENT_OTHER): Payer: Medicare Other | Admitting: Lab

## 2011-12-20 DIAGNOSIS — J90 Pleural effusion, not elsewhere classified: Secondary | ICD-10-CM | POA: Insufficient documentation

## 2011-12-20 DIAGNOSIS — C3490 Malignant neoplasm of unspecified part of unspecified bronchus or lung: Secondary | ICD-10-CM | POA: Insufficient documentation

## 2011-12-20 DIAGNOSIS — Z951 Presence of aortocoronary bypass graft: Secondary | ICD-10-CM | POA: Insufficient documentation

## 2011-12-20 DIAGNOSIS — C3430 Malignant neoplasm of lower lobe, unspecified bronchus or lung: Secondary | ICD-10-CM

## 2011-12-20 DIAGNOSIS — Z5111 Encounter for antineoplastic chemotherapy: Secondary | ICD-10-CM

## 2011-12-20 LAB — CBC WITH DIFFERENTIAL/PLATELET
Basophils Absolute: 0 10*3/uL (ref 0.0–0.1)
Eosinophils Absolute: 0.3 10*3/uL (ref 0.0–0.5)
HCT: 38.7 % (ref 38.4–49.9)
HGB: 13.7 g/dL (ref 13.0–17.1)
LYMPH%: 21.3 % (ref 14.0–49.0)
MCV: 91 fL (ref 79.3–98.0)
MONO#: 0.5 10*3/uL (ref 0.1–0.9)
MONO%: 10.5 % (ref 0.0–14.0)
NEUT#: 2.9 10*3/uL (ref 1.5–6.5)
NEUT%: 62.1 % (ref 39.0–75.0)
Platelets: 133 10*3/uL — ABNORMAL LOW (ref 140–400)
RBC: 4.26 10*6/uL (ref 4.20–5.82)

## 2011-12-20 LAB — CMP (CANCER CENTER ONLY)
Alkaline Phosphatase: 70 U/L (ref 26–84)
BUN, Bld: 12 mg/dL (ref 7–22)
CO2: 32 mEq/L (ref 18–33)
Creat: 0.9 mg/dl (ref 0.6–1.2)
Glucose, Bld: 107 mg/dL (ref 73–118)
Total Bilirubin: 0.8 mg/dl (ref 0.20–1.60)

## 2011-12-20 MED ORDER — IOHEXOL 300 MG/ML  SOLN
80.0000 mL | Freq: Once | INTRAMUSCULAR | Status: AC | PRN
  Administered 2011-12-20: 80 mL via INTRAVENOUS

## 2011-12-25 ENCOUNTER — Ambulatory Visit (INDEPENDENT_AMBULATORY_CARE_PROVIDER_SITE_OTHER): Payer: Medicare Other | Admitting: Thoracic Surgery (Cardiothoracic Vascular Surgery)

## 2011-12-25 ENCOUNTER — Ambulatory Visit (HOSPITAL_BASED_OUTPATIENT_CLINIC_OR_DEPARTMENT_OTHER): Payer: Medicare Other | Admitting: Internal Medicine

## 2011-12-25 VITALS — BP 145/76 | HR 60 | Temp 97.1°F | Wt 192.1 lb

## 2011-12-25 DIAGNOSIS — Z9221 Personal history of antineoplastic chemotherapy: Secondary | ICD-10-CM

## 2011-12-25 DIAGNOSIS — Z85118 Personal history of other malignant neoplasm of bronchus and lung: Secondary | ICD-10-CM

## 2011-12-25 DIAGNOSIS — C3490 Malignant neoplasm of unspecified part of unspecified bronchus or lung: Secondary | ICD-10-CM

## 2011-12-25 NOTE — Progress Notes (Signed)
Rodney Christensen OFFICE PROGRESS NOTE  PRINCIPAL DIAGNOSIS:  Stage IIB (T3 N0 M0) non-small cell lung cancer consistent with squamous cell carcinoma diagnosed in March of 2012.  PRIOR THERAPY:   1. Status post concurrent chemoradiation with weekly carboplatin and paclitaxel, last dose was given 04/08/2011.  2. Status post left lower lobectomy with mediastinal lymph node dissection under the care of Dr. Dorris Fetch on 06/11/2011 with no evidence for residual disease.  CURRENT THERAPY:  None.  INTERVAL HISTORY: Rodney Christensen 71 y.o. male returns to the clinic today for three-month followup visit accompanied his wife. The patient has no complaints today and he is feeling fine. He denied having any significant chest pain or shortness of breath, no cough or hemoptysis, no weight loss or night sweats. He has repeat CT scan of the chest performed recently and he is here today for evaluation and discussion of his scan results.   MEDICAL HISTORY: Past Medical History  Diagnosis Date  . CAD, NATIVE VESSEL 03/22/2010  . 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  . HYPERTENSION, UNSPECIFIED 03/17/2009  . HYPERLIPIDEMIA-MIXED 03/17/2009  . Squamous cell carcinoma lung     Left lower lobe     ALLERGIES:  is allergic to cephalexin and latanoprost.  MEDICATIONS:  Current Outpatient Prescriptions  Medication Sig Dispense Refill  . amLODipine (NORVASC) 5 MG tablet Take 5 mg by mouth daily.        . Ascorbic Acid (VITAMIN C) 500 MG tablet Take 1,000 mg by mouth daily.       Marland Kitchen aspirin 81 MG tablet Take 81 mg by mouth daily.        . B Complex Vitamins (B COMPLEX 100 PO) Take by mouth daily.        . beta carotene 16109 UNIT capsule Take 25,000 Units by mouth daily.        . Bilberry 500 MG CAPS Take 1,000 mg by mouth daily.       . bimatoprost (LUMIGAN) 0.03 % ophthalmic drops Place 1 drop into both eyes at bedtime.        . Calcium-Magnesium-Zinc (CAL-MAG-ZINC PO) Take by mouth as directed.        .  Chelated Zinc 50 MG TABS Take by mouth daily.        . Chromium Picolinate 500 MCG TABS Take by mouth daily.        . Cinnamon 500 MG capsule Take 1,000 mg by mouth 2 (two) times daily.       . Coenzyme Q10 (CO Q 10) 100 MG CAPS Take by mouth daily.        Marland Kitchen doxazosin (CARDURA) 8 MG tablet Take 8 mg by mouth at bedtime.        . fish oil-omega-3 fatty acids 1000 MG capsule Take 2 g by mouth 3 (three) times daily.        . folic acid (FOLVITE) 1 MG tablet Take 1 mg by mouth daily.        . Garlic 1000 MG CAPS Take by mouth daily.        . Ginkgo Biloba (GINKOBA) 40 MG TABS Take by mouth daily.        Marland Kitchen HYDROcodone-acetaminophen (LORTAB) 10-500 MG per tablet       . levobunolol (BETAGAN) 0.5 % ophthalmic solution       . lisinopril (PRINIVIL,ZESTRIL) 40 MG tablet Take 40 mg by mouth 2 (two) times daily.       . LUTEIN PO Take by mouth  2 (two) times daily.        . Melatonin 1 MG CAPS Take by mouth at bedtime.        . metoprolol (LOPRESSOR) 50 MG tablet Take 50 mg by mouth 2 (two) times daily.        . Multiple Vitamin (MULTIVITAMIN) tablet Take 1 tablet by mouth daily.        . potassium chloride SA (K-DUR,KLOR-CON) 20 MEQ tablet       . simvastatin (ZOCOR) 80 MG tablet Take 20 mg by mouth at bedtime.       . torsemide (DEMADEX) 20 MG tablet       . traMADol (ULTRAM) 50 MG tablet TAKE 1 OR 2 TABLETS BY MOUTH EVERY 4-6 HOURS AS NEEDED FOR PAIN  40 tablet  0  . FLUVIRIN INJ         SURGICAL HISTORY:  Past Surgical History  Procedure Date  . Coronary artery bypass graft 10/10/2004    x5  . Carotid endarterectomy   . Bronchoscopy 06/11/11    [facility]  . Lt vats,lt thoacotomy,lt lower lobectomy with  mediastinal node  dissection 06/11/11    [facility]    REVIEW OF SYSTEMS:  A comprehensive review of systems was negative.   PHYSICAL EXAMINATION: General appearance: alert, cooperative, appears stated age and no distress Resp: clear to auscultation bilaterally Cardio: regular rate and  rhythm, S1, S2 normal, no murmur, click, rub or gallop GI: soft, non-tender; bowel sounds normal; no masses,  no organomegaly Extremities: extremities normal, atraumatic, no cyanosis or edema Neurologic: Alert and oriented X 3, normal strength and tone. Normal symmetric reflexes. Normal coordination and gait  ECOG PERFORMANCE STATUS: 0 - Asymptomatic  Blood pressure 145/76, pulse 60, temperature 97.1 F (36.2 C), temperature source Oral, weight 192 lb 1.6 oz (87.136 kg).  LABORATORY DATA: Lab Results  Component Value Date   WBC 4.7 12/20/2011   HGB 13.7 12/20/2011   HCT 38.7 12/20/2011   MCV 91.0 12/20/2011   PLT 133* 12/20/2011      Chemistry      Component Value Date/Time   NA 146* 12/20/2011 0751   NA 139 09/09/2011 1037   K 3.7 12/20/2011 0751   K 3.7 09/09/2011 1037   CL 102 12/20/2011 0751   CL 101 09/09/2011 1037   CO2 32 12/20/2011 0751   CO2 27 09/09/2011 1037   BUN 12 12/20/2011 0751   BUN 10 09/09/2011 1037   CREATININE 0.9 12/20/2011 0751   CREATININE 0.86 09/09/2011 1037      Component Value Date/Time   CALCIUM 9.2 12/20/2011 0751   CALCIUM 9.0 09/09/2011 1037   ALKPHOS 70 12/20/2011 0751   ALKPHOS 73 09/09/2011 1037   AST 21 12/20/2011 0751   AST 16 09/09/2011 1037   ALT 15 09/09/2011 1037   BILITOT 0.80 12/20/2011 0751   BILITOT 0.4 09/09/2011 1037       RADIOGRAPHIC STUDIES: Ct Chest W Contrast  12/20/2011  *RADIOLOGY REPORT*  Clinical Data: Lung cancer diagnosed in 2012.  Chemotherapy and radiation therapy completed.  CT CHEST WITH CONTRAST  Technique:  Multidetector CT imaging of the chest was performed following the standard protocol during bolus administration of intravenous contrast.  Contrast: 80mL OMNIPAQUE IOHEXOL 300 MG/ML IV SOLN  Comparison: chest CT 09/17/2011 and 05/17/2011.  Findings: There is a loculated left pleural effusion which is unchanged from the most recent examination, but has enlarged over the last 7 months.  This demonstrates no definite associated  pleural nodularity or abnormal enhancement.  The volume loss in the left hemithorax is stable.  There is bronchial distortion and peribronchial opacity in the left perihilar region most consistent with radiation pneumonitis. Compared with the most recent examination, this overall process has a contracted.  The aeration of the adjacent left lower lobe has slightly improved.  There is no progressive airspace disease, well-defined mass or endobronchial lesion.  Focal ground-glass opacity in the right upper lobe on image 27 is unchanged.  This measures 5 mm in diameter and demonstrates no solid component.  The right lung is otherwise clear.  The mediastinum appears stable status post CABG.  There are no enlarged mediastinal or hilar lymph nodes.  There is no pericardial or right pleural effusion.  The visualized upper abdomen is unremarkable aside from stable calcified cholelithiasis.  There is no adrenal mass.  There are no suspicious osseous findings.  IMPRESSION:  1. Evolving radiation changes in the left perihilar region with contracting fibrosis and bronchial distortion.  No residual mass is identified. 2.  Stable loculated left pleural effusion without pleural nodularity. 3.  No lymphadenopathy. 4.  Stable small ground-glass opacity in the right upper lobe.  Original Report Authenticated By: Gerrianne Scale, M.D.    ASSESSMENT: This is a very pleasant 71 years old white male with history of stage IIb non-small cell lung cancer status post concurrent chemoradiation followed by left lower lobectomy and mediastinal lymph node biopsy that showed no evidence for residual disease. The patient is doing fine and he has no evidence for disease recurrence. I discussed the scan results with the patient.   PLAN: I recommended for him continuous observation for now with repeat CT scan of the chest with contrast in 3 months. He was advised to call me immediately if he has any concerning symptoms in the  interval.   All questions were answered. The patient knows to call the clinic with any problems, questions or concerns. We can certainly see the patient much sooner if necessary.

## 2011-12-26 ENCOUNTER — Encounter: Payer: Self-pay | Admitting: Thoracic Surgery (Cardiothoracic Vascular Surgery)

## 2011-12-26 ENCOUNTER — Ambulatory Visit (INDEPENDENT_AMBULATORY_CARE_PROVIDER_SITE_OTHER): Payer: Medicare Other | Admitting: Thoracic Surgery (Cardiothoracic Vascular Surgery)

## 2011-12-26 VITALS — BP 144/75 | HR 59 | Temp 97.2°F | Resp 16 | Ht 67.0 in | Wt 188.0 lb

## 2011-12-26 DIAGNOSIS — C3490 Malignant neoplasm of unspecified part of unspecified bronchus or lung: Secondary | ICD-10-CM

## 2011-12-26 DIAGNOSIS — Z9889 Other specified postprocedural states: Secondary | ICD-10-CM

## 2011-12-26 DIAGNOSIS — Z902 Acquired absence of lung [part of]: Secondary | ICD-10-CM

## 2011-12-26 NOTE — Progress Notes (Deleted)
Patient ID: Rodney Christensen, male   DOB: [birth year], 71 y.o.   MRN: [id number]

## 2011-12-26 NOTE — Progress Notes (Signed)
HPI 71 year old status post neoadjuvant chemotherapy radiotherapy and a left lower lobectomy in July 2012. He returns for a scheduled 6 month postoperative followup visit. [name] states that he has been feeling well. He has not had any cough, hemoptysis, wheezing, or shortness of breath. His exercise tolerance continues to improve. He has started to gain weight, greater than 10 pounds since his last visit. Overall he feels well and feels like he is fully recovered from the surgery  Current Outpatient Prescriptions  Medication Sig Dispense Refill  . amLODipine (NORVASC) 5 MG tablet Take 5 mg by mouth daily.        . Ascorbic Acid (VITAMIN C) 500 MG tablet Take 1,000 mg by mouth daily.       Marland Kitchen aspirin 81 MG tablet Take 81 mg by mouth daily.        . B Complex Vitamins (B COMPLEX 100 PO) Take by mouth daily.        . beta carotene 04540 UNIT capsule Take 25,000 Units by mouth daily.        . Bilberry 500 MG CAPS Take 1,000 mg by mouth daily.       . bimatoprost (LUMIGAN) 0.03 % ophthalmic drops Place 1 drop into both eyes at bedtime.        . Calcium-Magnesium-Zinc (CAL-MAG-ZINC PO) Take by mouth as directed.        . Chelated Zinc 50 MG TABS Take by mouth daily.        . Chromium Picolinate 500 MCG TABS Take by mouth daily.        . Cinnamon 500 MG capsule Take 1,000 mg by mouth 2 (two) times daily.       . Coenzyme Q10 (CO Q 10) 100 MG CAPS Take by mouth daily.        Marland Kitchen doxazosin (CARDURA) 8 MG tablet Take 8 mg by mouth at bedtime.        . fish oil-omega-3 fatty acids 1000 MG capsule Take 2 g by mouth 3 (three) times daily.        . folic acid (FOLVITE) 1 MG tablet Take 1 mg by mouth daily.        . Garlic 1000 MG CAPS Take by mouth daily.        . Ginkgo Biloba (GINKOBA) 40 MG TABS Take by mouth daily.        Marland Kitchen HYDROcodone-acetaminophen (LORTAB) 10-500 MG per tablet       . levobunolol (BETAGAN) 0.5 % ophthalmic solution       . lisinopril (PRINIVIL,ZESTRIL) 40 MG tablet Take 40 mg by  mouth 2 (two) times daily.       . LUTEIN PO Take by mouth 2 (two) times daily.        . Melatonin 1 MG CAPS Take by mouth at bedtime.        . metoprolol (LOPRESSOR) 50 MG tablet Take 50 mg by mouth 2 (two) times daily.        . Multiple Vitamin (MULTIVITAMIN) tablet Take 1 tablet by mouth daily.        . potassium chloride SA (K-DUR,KLOR-CON) 20 MEQ tablet       . simvastatin (ZOCOR) 80 MG tablet Take 20 mg by mouth at bedtime.       . torsemide (DEMADEX) 20 MG tablet       . traMADol (ULTRAM) 50 MG tablet TAKE 1 OR 2 TABLETS BY MOUTH EVERY 4-6 HOURS AS NEEDED FOR PAIN  40 tablet  0  . FLUVIRIN INJ          Review of Systems: See history of present illness. No chest pain, shortness of breath, cough or hemoptysis. Weight increased. All of systems negative  Physical Exam  Vitals reviewed. Constitutional: He appears well-developed and well-nourished. No distress.  HENT:  Head: Normocephalic and atraumatic.  Neck: Neck supple. No thyromegaly present.  Cardiovascular: Normal rate, regular rhythm and normal heart sounds.   Pulmonary/Chest: Effort normal.       diminished BS left base  Lymphadenopathy:    He has no cervical adenopathy.       Right cervical: No superficial cervical, no deep cervical and no posterior cervical adenopathy present.      Left cervical: No superficial cervical, no deep cervical and no posterior cervical adenopathy present.    He has no axillary adenopathy.       Right: No epitrochlear adenopathy present.       Left: No epitrochlear adenopathy present.     Diagnostic Tests: CT of the chest done on 12/20/11 shows post radiation and postoperative changes on the left side. There is evolution of the scarring at the left base. There is a stable effusion. There is a 5 mm right lung nodule that is unchanged from the previous study  Impression: 71 year old male with history of stage IIb non-small cell carcinoma of the left lower lobe who was treated with  neoadjuvant chemotherapy and radiation therapy and then underwent a left lower lobectomy in July of last year. He is now 6 months out from surgery and is doing well with no evidence recurrent disease.  Plan: He has an appointment to have a CT scan and see Dr. Gwenyth Bouillon in April.  I will plan to see him back in 6 months with a CT of the chest for his one-year followup visit. The patient is to call if he has any issues in the interim.

## 2012-02-25 ENCOUNTER — Encounter: Payer: Self-pay | Admitting: *Deleted

## 2012-02-25 DIAGNOSIS — Z923 Personal history of irradiation: Secondary | ICD-10-CM | POA: Insufficient documentation

## 2012-02-25 DIAGNOSIS — C4490 Unspecified malignant neoplasm of skin, unspecified: Secondary | ICD-10-CM | POA: Insufficient documentation

## 2012-02-25 DIAGNOSIS — N400 Enlarged prostate without lower urinary tract symptoms: Secondary | ICD-10-CM | POA: Insufficient documentation

## 2012-02-28 ENCOUNTER — Ambulatory Visit: Admission: RE | Admit: 2012-02-28 | Payer: Medicare Other | Source: Ambulatory Visit | Admitting: Radiation Oncology

## 2012-03-25 ENCOUNTER — Other Ambulatory Visit (HOSPITAL_BASED_OUTPATIENT_CLINIC_OR_DEPARTMENT_OTHER): Payer: Medicare Other | Admitting: Lab

## 2012-03-25 ENCOUNTER — Ambulatory Visit (HOSPITAL_COMMUNITY)
Admission: RE | Admit: 2012-03-25 | Discharge: 2012-03-25 | Disposition: A | Discharge status: Home / Self Care | Payer: Medicare Other | Source: Ambulatory Visit | Attending: Internal Medicine | Admitting: Internal Medicine

## 2012-03-25 ENCOUNTER — Encounter: Payer: Self-pay | Admitting: Radiation Oncology

## 2012-03-25 DIAGNOSIS — C3490 Malignant neoplasm of unspecified part of unspecified bronchus or lung: Secondary | ICD-10-CM

## 2012-03-25 DIAGNOSIS — C3430 Malignant neoplasm of lower lobe, unspecified bronchus or lung: Secondary | ICD-10-CM

## 2012-03-25 DIAGNOSIS — K8020 Calculus of gallbladder without cholecystitis without obstruction: Secondary | ICD-10-CM | POA: Insufficient documentation

## 2012-03-25 DIAGNOSIS — J9383 Other pneumothorax: Secondary | ICD-10-CM | POA: Insufficient documentation

## 2012-03-25 LAB — CBC WITH DIFFERENTIAL/PLATELET
Basophils Absolute: 0 10*3/uL (ref 0.0–0.1)
Eosinophils Absolute: 0.3 10*3/uL (ref 0.0–0.5)
HGB: 13.6 g/dL (ref 13.0–17.1)
LYMPH%: 18.3 % (ref 14.0–49.0)
MCV: 91.6 fL (ref 79.3–98.0)
MONO#: 0.5 10*3/uL (ref 0.1–0.9)
MONO%: 8.7 % (ref 0.0–14.0)
NEUT#: 3.8 10*3/uL (ref 1.5–6.5)
Platelets: 113 10*3/uL — ABNORMAL LOW (ref 140–400)
RBC: 4.23 10*6/uL (ref 4.20–5.82)
RDW: 13 % (ref 11.0–14.6)
WBC: 5.7 10*3/uL (ref 4.0–10.3)
nRBC: 0 % (ref 0–0)

## 2012-03-25 LAB — CMP (CANCER CENTER ONLY)
Alkaline Phosphatase: 68 U/L (ref 26–84)
CO2: 26 mEq/L (ref 18–33)
Creat: 0.7 mg/dl (ref 0.6–1.2)
Glucose, Bld: 105 mg/dL (ref 73–118)
Sodium: 136 mEq/L (ref 128–145)
Total Bilirubin: 0.9 mg/dl (ref 0.20–1.60)
Total Protein: 7.2 g/dL (ref 6.4–8.1)

## 2012-03-25 MED ORDER — IOHEXOL 300 MG/ML  SOLN
80.0000 mL | Freq: Once | INTRAMUSCULAR | Status: AC | PRN
  Administered 2012-03-25: 80 mL via INTRAVENOUS

## 2012-03-26 ENCOUNTER — Telehealth: Payer: Self-pay | Admitting: Internal Medicine

## 2012-03-26 ENCOUNTER — Ambulatory Visit (HOSPITAL_BASED_OUTPATIENT_CLINIC_OR_DEPARTMENT_OTHER): Payer: Medicare Other | Admitting: Internal Medicine

## 2012-03-26 ENCOUNTER — Encounter: Payer: Self-pay | Admitting: *Deleted

## 2012-03-26 VITALS — BP 137/71 | HR 65 | Temp 96.5°F | Ht 67.0 in | Wt 197.5 lb

## 2012-03-26 DIAGNOSIS — Z85118 Personal history of other malignant neoplasm of bronchus and lung: Secondary | ICD-10-CM

## 2012-03-26 DIAGNOSIS — C3490 Malignant neoplasm of unspecified part of unspecified bronchus or lung: Secondary | ICD-10-CM

## 2012-03-26 NOTE — Telephone Encounter (Signed)
appts made and printed for pt aom °

## 2012-03-26 NOTE — Progress Notes (Signed)
Spoke with Rodney Christensen and wife at [facility] today.  Questions and concerns addressed

## 2012-03-26 NOTE — Progress Notes (Signed)
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   PRIOR THERAPY:  1. Status post concurrent chemoradiation with weekly carboplatin and paclitaxel, last dose was given 04/08/2011.  2. Status post left lower lobectomy with mediastinal lymph node dissection under the care of Dr. Dorris Fetch on 06/11/2011 with no evidence for residual disease.  CURRENT THERAPY: Observation.   INTERVAL HISTORY: Rodney Christensen 71 y.o. male returns to the clinic today for routine three-month followup visit accompanied by his wife. The patient has no complaints today. He denied having any significant chest pain or shortness breath except with exertion. No cough or hemoptysis. He denied having any significant weight loss or night sweats. He has repeat CT scan of the chest performed recently and he is here today for evaluation and discussion of his scan results.  MEDICAL HISTORY: Past Medical History  Diagnosis Date  . CAD, NATIVE VESSEL 03/22/2010  . 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  . HYPERTENSION, UNSPECIFIED 03/17/2009  . HYPERLIPIDEMIA-MIXED 03/17/2009  . History of radiation therapy 03/18/11 to 04/19/11    lower left lung  . BPH (benign prostatic hyperplasia)   . Macular degeneration   . Diverticulosis   . Squamous cell carcinoma lung     Left lower lobe   . Skin cancer     melanoma-nose    ALLERGIES:  is allergic to cephalexin and latanoprost.  MEDICATIONS:  Current Outpatient Prescriptions  Medication Sig Dispense Refill  . amLODipine (NORVASC) 5 MG tablet Take 5 mg by mouth daily.        . Ascorbic Acid (VITAMIN C) 500 MG tablet Take 1,000 mg by mouth daily.       Marland Kitchen aspirin 81 MG tablet Take 81 mg by mouth daily.        . B Complex Vitamins (B COMPLEX 100 PO) Take by mouth daily.        . beta carotene 28413 UNIT  capsule Take 25,000 Units by mouth daily.        . Bilberry 500 MG CAPS Take 1,000 mg by mouth daily.       . bimatoprost (LUMIGAN) 0.03 % ophthalmic drops Place 1 drop into both eyes at bedtime.        . Calcium-Magnesium-Zinc (CAL-MAG-ZINC PO) Take by mouth as directed.        . Chelated Zinc 50 MG TABS Take by mouth daily.        . Chromium Picolinate 500 MCG TABS Take by mouth daily.        . Cinnamon 500 MG capsule Take 1,000 mg by mouth 2 (two) times daily.       . Coenzyme Q10 (CO Q 10) 100 MG CAPS Take by mouth daily.        Marland Kitchen doxazosin (CARDURA) 8 MG tablet Take 8 mg by mouth at bedtime.        . fish oil-omega-3 fatty acids 1000 MG capsule Take 2 g by mouth 3 (three) times daily.        . folic acid (FOLVITE) 1 MG tablet Take 1 mg by mouth daily.        . Garlic 1000 MG CAPS Take by mouth daily.        . Ginkgo Biloba (GINKOBA) 40 MG TABS Take by mouth daily.        Marland Kitchen  lisinopril (PRINIVIL,ZESTRIL) 40 MG tablet Take 40 mg by mouth 2 (two) times daily.       . LUTEIN PO Take by mouth 2 (two) times daily.        . Melatonin 1 MG CAPS Take by mouth at bedtime.        . metoprolol (LOPRESSOR) 50 MG tablet Take 50 mg by mouth 2 (two) times daily.        . Multiple Vitamin (MULTIVITAMIN) tablet Take 1 tablet by mouth daily.        . potassium chloride SA (K-DUR,KLOR-CON) 20 MEQ tablet       . simvastatin (ZOCOR) 80 MG tablet Take 20 mg by mouth at bedtime.       . torsemide (DEMADEX) 20 MG tablet 10 mg daily.       Marland Kitchen FLUVIRIN INJ       . HYDROcodone-acetaminophen (LORTAB) 10-500 MG per tablet       . levobunolol (BETAGAN) 0.5 % ophthalmic solution       . traMADol (ULTRAM) 50 MG tablet TAKE 1 OR 2 TABLETS BY MOUTH EVERY 4-6 HOURS AS NEEDED FOR PAIN  40 tablet  0    SURGICAL HISTORY:  Past Surgical History  Procedure Date  . Coronary artery bypass graft 10/10/2004    x5  . Carotid endarterectomy   . Bronchoscopy 06/11/11    [facility]  . Lt vats,lt thoacotomy,lt lower lobectomy  with  mediastinal node  dissection 06/11/11    [facility]rickson    REVIEW OF SYSTEMS:  A comprehensive review of systems was negative except for: Respiratory: positive for dyspnea on exertion   PHYSICAL EXAMINATION: General appearance: alert, cooperative and no distress Head: Normocephalic, without obvious abnormality, atraumatic Neck: no adenopathy Resp: clear to auscultation bilaterally Cardio: regular rate and rhythm, S1, S2 normal, no murmur, click, rub or gallop GI: soft, non-tender; bowel sounds normal; no masses,  no organomegaly Extremities: extremities normal, atraumatic, no cyanosis or edema  ECOG PERFORMANCE STATUS: 0 - Asymptomatic  Blood pressure 137/71, pulse 65, temperature 96.5 F (35.8 C), temperature source Oral, height 5\' 7"  (1.702 m), weight 197 lb 8 oz (89.585 kg).  LABORATORY DATA: Lab Results  Component Value Date   WBC 5.7 03/25/2012   HGB 13.6 03/25/2012   HCT 38.8 03/25/2012   MCV 91.6 03/25/2012   PLT 113* 03/25/2012      Chemistry      Component Value Date/Time   NA 136 03/25/2012 0933   NA 139 09/09/2011 1037   K 4.1 03/25/2012 0933   K 3.7 09/09/2011 1037   CL 96* 03/25/2012 0933   CL 101 09/09/2011 1037   CO2 26 03/25/2012 0933   CO2 27 09/09/2011 1037   BUN 11 03/25/2012 0933   BUN 10 09/09/2011 1037   CREATININE 0.7 03/25/2012 0933   CREATININE 0.86 09/09/2011 1037      Component Value Date/Time   CALCIUM 8.7 03/25/2012 0933   CALCIUM 9.0 09/09/2011 1037   ALKPHOS 68 03/25/2012 0933   ALKPHOS 73 09/09/2011 1037   AST 23 03/25/2012 0933   AST 16 09/09/2011 1037   ALT 15 09/09/2011 1037   BILITOT 0.90 03/25/2012 0933   BILITOT 0.4 09/09/2011 1037       RADIOGRAPHIC STUDIES: Ct Chest W Contrast  03/25/2012  *RADIOLOGY REPORT*  Clinical Data: Lung cancer diagnosed in January 2012.  Chemotherapy and radiation therapy complete.  Left lower lobe activity. Stage IIB  Non small cell lung cancer.   CT  CHEST WITH CONTRAST  Technique:  Multidetector CT imaging of  the chest was performed following the standard protocol during bolus administration of intravenous contrast.  Contrast: 80mL OMNIPAQUE IOHEXOL 300 MG/ML  SOLN  Comparison: On the CT 12/20/2011  Findings: No axillary or supraclavicular lymphadenopathy.  No mediastinal lymphadenopathy.  No hilar lymphadenopathy.  There is pleural thickening in the left lung apex similar to prior. Small volume of pleural fluid at the left lung base which is similar to prior.  There is atelectasis with air bronchograms in the left lower lobe which are also unchanged from prior.  There are surgical clips in the infrahilar location consistent with left lower lobectomy.  New small pneumothorax (less than 5% of the hemithorax volume) in the anterior left lung base and extending along the anterior aspect of the left upper lobe.  No evidence of pneumomediastinum.  There are no suspicious pulmonary nodules.  There is a tiny 2 mm nodule in the right upper lobe (image number 20 which is unchanged.  The right upper lobe 5 mm pulmonary as described on prior and is less well defined (image 26).  Limited view of the upper abdomen and a shows normal adrenal glands.  No focal hepatic lesion.  Gallstones in the gallbladder. Pancreas appears normal.  Limited view of the skeleton is unremarkable other than degenerative changes of the spine.  IMPRESSION:  1.  New small  pneumothorax extending from the left lung base.  2.  No evidence of lung cancer recurrence.  3.   Stable postoperative change in the left hemithorax with pleural thickening at the apex,  pleural fluid at the base, and basilar atelectasis with air bronchograms.  Findings conveyed to Dr. Dorris Fetch on 03/25/2012 at 12:45 p.m.  Original Report Authenticated By: Genevive Bi, M.D.    ASSESSMENT: This is a very pleasant 71 years old white male with history of stage IIb non-small cell lung cancer status post concurrent chemoradiation followed by left lobe lobectomy and lymph node  dissection. The patient is doing fine and he has no evidence for disease recurrence on the recent scan, but he has a small pneumothorax. He is currently asymptomatic.  PLAN: I discussed the scan results with the patient and his wife today. I recommended for him continuous observation for now. I would see him back for followup visit in 3 months with repeat CT scan of the chest with contrast. He was advised to call me immediately if he has any concerning symptoms in the interval specialty any shortness of breath or chest pain.   All questions were answered. The patient knows to call the clinic with any problems, questions or concerns. We can certainly see the patient much sooner if necessary.

## 2012-03-27 ENCOUNTER — Encounter: Payer: Self-pay | Admitting: Radiation Oncology

## 2012-03-27 ENCOUNTER — Ambulatory Visit
Admission: RE | Admit: 2012-03-27 | Discharge: 2012-03-27 | Disposition: A | Discharge status: Home / Self Care | Payer: Medicare Other | Source: Ambulatory Visit | Attending: Radiation Oncology | Admitting: Radiation Oncology

## 2012-03-27 VITALS — BP 157/76 | HR 62 | Temp 98.2°F | Resp 18 | Wt 196.7 lb

## 2012-03-27 DIAGNOSIS — C3430 Malignant neoplasm of lower lobe, unspecified bronchus or lung: Secondary | ICD-10-CM

## 2012-03-27 NOTE — Progress Notes (Signed)
Patient presents to the clinic today accompanied by his wife for a follow up appointment with Dr. Mitzi Hansen. Patient is alert and oriented to person, place, and time. No distress noted. Steady gait noted. Pleasant affect noted. Patient denies pain at this time. Patient denies nausea, vomiting, headache, or dizziness. Patient reports an occasional slight headache but relates these to allergies and "sitting in front of computer too long." Patient reports his weight is stable. Patient reports a good appetite. Patient reports sleeping without difficulty. Patient denies cough. Patient reports occasional labored breathing upon exertion. Patient seen by Dr. Arbutus Ped yesterday and reviewed chest ct yesterday. Reported all findings to Dr. Mitzi Hansen.

## 2012-03-27 NOTE — Progress Notes (Signed)
Radiation Oncology         [phone]) [phone] ________________________________  Name: Rodney Christensen MRN: [id number]  Date: 03/27/2012  DOB: [birth year]  Follow-Up Visit Note  CC: No primary provider on file.  Si Gaul, MD Charlett Lango M.D.  Diagnosis:   Non-small cell lung cancer, T3, N0, M0  Interval Since Last Radiation:  The patient completed radiation treatment approximately one year ago   Narrative:  The patient returns today for routine follow-up.  The patient has done well since she was last seen. His primary complaint today is some decreased vision. He denies any worsening shortness of breath teary at he denies any esophagitis chest discomfort, or other areas of pain. He denies any GI symptoms today. The patient is pleased with how he has done overall he had a CT scan of the chest completed 2 days ago. There is no evidence of lung cancer recurrence or new disease.                              ALLERGIES:  is allergic to cephalexin and latanoprost.  Meds: Current Outpatient Prescriptions  Medication Sig Dispense Refill  . amLODipine (NORVASC) 5 MG tablet Take 5 mg by mouth daily.        . Ascorbic Acid (VITAMIN C) 500 MG tablet Take 1,000 mg by mouth daily.       Marland Kitchen aspirin 81 MG tablet Take 81 mg by mouth daily.        . B Complex Vitamins (B COMPLEX 100 PO) Take by mouth daily.        . beta carotene 56213 UNIT capsule Take 25,000 Units by mouth daily.        . Bilberry 500 MG CAPS Take 1,000 mg by mouth daily.       . bimatoprost (LUMIGAN) 0.03 % ophthalmic drops Place 1 drop into both eyes at bedtime.        . Calcium-Magnesium-Zinc (CAL-MAG-ZINC PO) Take by mouth as directed.        . Chelated Zinc 50 MG TABS Take by mouth daily.        . Chromium Picolinate 500 MCG TABS Take by mouth daily.        . Cinnamon 500 MG capsule Take 1,000 mg by mouth 2 (two) times daily.       . Coenzyme Q10 (CO Q 10) 100 MG CAPS Take by mouth daily.        . DORZOLAMIDE HCL-TIMOLOL MAL OP  Apply to eye. One drop twice a day in each eye      . doxazosin (CARDURA) 8 MG tablet Take 8 mg by mouth at bedtime.        . fish oil-omega-3 fatty acids 1000 MG capsule Take 2 g by mouth 3 (three) times daily.        Marland Kitchen FLUVIRIN INJ       . folic acid (FOLVITE) 1 MG tablet Take 1 mg by mouth daily.        . Garlic 1000 MG CAPS Take by mouth daily.        . Ginkgo Biloba (GINKOBA) 40 MG TABS Take by mouth daily.        Marland Kitchen HYDROcodone-acetaminophen (LORTAB) 10-500 MG per tablet       . levobunolol (BETAGAN) 0.5 % ophthalmic solution       . lisinopril (PRINIVIL,ZESTRIL) 40 MG tablet Take 40 mg by mouth 2 (two) times daily.       Marland Kitchen  LUTEIN PO Take by mouth 2 (two) times daily.        . Melatonin 1 MG CAPS Take by mouth at bedtime.        . metoprolol (LOPRESSOR) 50 MG tablet Take 50 mg by mouth 2 (two) times daily.        . Multiple Vitamin (MULTIVITAMIN) tablet Take 1 tablet by mouth daily.        . potassium chloride SA (K-DUR,KLOR-CON) 20 MEQ tablet       . simvastatin (ZOCOR) 80 MG tablet Take 20 mg by mouth at bedtime.       . torsemide (DEMADEX) 20 MG tablet 10 mg daily.       . traMADol (ULTRAM) 50 MG tablet TAKE 1 OR 2 TABLETS BY MOUTH EVERY 4-6 HOURS AS NEEDED FOR PAIN  40 tablet  0    Physical Findings: The patient is in no acute distress. Patient is alert and oriented.  weight is 196 lb 11.2 oz (89.223 kg). His oral temperature is 98.2 F (36.8 C). His blood pressure is 157/76 and his pulse is 62. His respiration is 18 and oxygen saturation is 97%. .   General: Well-developed, in no acute distress HEENT: Normocephalic, atraumatic; Neck: Supple without any lymphadenopathy Cardiovascular: Regular rate and rhythm Respiratory: Clear to auscultation bilaterally GI: Soft, nontender, normal bowel sounds Extremities: No edema present Neuro: No focal deficits    Lab Findings: Lab Results  Component Value Date   WBC 5.7 03/25/2012   HGB 13.6 03/25/2012   HCT 38.8 03/25/2012   MCV  91.6 03/25/2012   PLT 113* 03/25/2012     Radiographic Findings: Ct Chest W Contrast  03/25/2012  *RADIOLOGY REPORT*  Clinical Data: Lung cancer diagnosed in January 2012.  Chemotherapy and radiation therapy complete.  Left lower lobe activity. Stage IIB  Non small cell lung cancer.   CT CHEST WITH CONTRAST  Technique:  Multidetector CT imaging of the chest was performed following the standard protocol during bolus administration of intravenous contrast.  Contrast: 80mL OMNIPAQUE IOHEXOL 300 MG/ML  SOLN  Comparison: On the CT 12/20/2011  Findings: No axillary or supraclavicular lymphadenopathy.  No mediastinal lymphadenopathy.  No hilar lymphadenopathy.  There is pleural thickening in the left lung apex similar to prior. Small volume of pleural fluid at the left lung base which is similar to prior.  There is atelectasis with air bronchograms in the left lower lobe which are also unchanged from prior.  There are surgical clips in the infrahilar location consistent with left lower lobectomy.  New small pneumothorax (less than 5% of the hemithorax volume) in the anterior left lung base and extending along the anterior aspect of the left upper lobe.  No evidence of pneumomediastinum.  There are no suspicious pulmonary nodules.  There is a tiny 2 mm nodule in the right upper lobe (image number 20 which is unchanged.  The right upper lobe 5 mm pulmonary as described on prior and is less well defined (image 26).  Limited view of the upper abdomen and a shows normal adrenal glands.  No focal hepatic lesion.  Gallstones in the gallbladder. Pancreas appears normal.  Limited view of the skeleton is unremarkable other than degenerative changes of the spine.  IMPRESSION:  1.  New small  pneumothorax extending from the left lung base.  2.  No evidence of lung cancer recurrence.  3.   Stable postoperative change in the left hemithorax with pleural thickening at the apex,  pleural fluid  at the base, and basilar atelectasis  with air bronchograms.  Findings conveyed to Dr. Dorris Fetch on 03/25/2012 at 12:45 p.m.  Original Report Authenticated By: Genevive Bi, M.D.    Impression:    Pleasant 71 year old male who continues to do well and remains clinically NED for his diagnosis of lung cancer  Plan:  The patient will return to our clinic on a when necessary basis.  I spent 10 minutes with the patient today, the majority of which was spent counseling the patient on the diagnosis of cancer and coordinating care.   Radene Gunning, M.D., Ph.D.

## 2012-04-01 ENCOUNTER — Ambulatory Visit (INDEPENDENT_AMBULATORY_CARE_PROVIDER_SITE_OTHER): Payer: Medicare Other | Admitting: Cardiology

## 2012-04-01 ENCOUNTER — Encounter: Payer: Self-pay | Admitting: Cardiology

## 2012-04-01 VITALS — BP 120/78 | HR 61 | Ht 67.0 in | Wt 198.0 lb

## 2012-04-01 DIAGNOSIS — I2510 Atherosclerotic heart disease of native coronary artery without angina pectoris: Secondary | ICD-10-CM

## 2012-04-01 DIAGNOSIS — I10 Essential (primary) hypertension: Secondary | ICD-10-CM

## 2012-04-01 DIAGNOSIS — I6529 Occlusion and stenosis of unspecified carotid artery: Secondary | ICD-10-CM

## 2012-04-01 DIAGNOSIS — Z9889 Other specified postprocedural states: Secondary | ICD-10-CM

## 2012-04-01 DIAGNOSIS — E785 Hyperlipidemia, unspecified: Secondary | ICD-10-CM

## 2012-04-01 NOTE — Assessment & Plan Note (Signed)
Needs followup carotid Dopplers. Has not been back to vascular surgery. We'll arrange Dopplers.

## 2012-04-01 NOTE — Patient Instructions (Signed)
Your physician recommends that you continue on your current medications as directed. Please refer to the Current Medication list given to you today.  Your physician wants you to follow-up in: 1 year with Dr. Daleen Squibb. You will receive a reminder letter in the mail two months in advance. If you don't  receive a letter, please call our office to schedule the follow-up appointment.  Your physician has requested that you have a carotid duplex. This test is an ultrasound of the carotid arteries in your neck. It looks at blood flow through these arteries that supply the brain with blood. Allow one hour for this exam. There are no restrictions or special instructions.

## 2012-04-01 NOTE — Assessment & Plan Note (Signed)
Stable. Continue aggressive secondary preventive therapy. 

## 2012-04-01 NOTE — Progress Notes (Signed)
HPI Rodney Christensen returns today for evaluation of his coronary artery disease and carotid disease. His hypertension and his hyperlipidemia are followed by his primary care physician. He's been diagnosed with squamous cell carcinoma of the long and is status post chemotherapy and surgery and radiation therapy. He is getting along relatively well. His last CAT scan was negative.  He is having  no angina or ischemic symptoms. He denies orthopnea, PND or edema. His simvastatin has been lowered to 20 mg a day.  He denies any symptoms of mini strokes or TIAs. He has not had followup with vascular surgery since his carotid endarterectomy. I do not see a recent Dopplers in our record.  Past Medical History  Diagnosis Date  . CAD, NATIVE VESSEL 03/22/2010  . 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  . HYPERTENSION, UNSPECIFIED 03/17/2009  . HYPERLIPIDEMIA-MIXED 03/17/2009  . History of radiation therapy 03/18/11 to 04/19/11    lower left lung  . BPH (benign prostatic hyperplasia)   . Macular degeneration   . Diverticulosis   . Squamous cell carcinoma lung     Left lower lobe   . Skin cancer     melanoma-nose    Current Outpatient Prescriptions  Medication Sig Dispense Refill  . amLODipine (NORVASC) 5 MG tablet Take 5 mg by mouth daily.        . Ascorbic Acid (VITAMIN C) 500 MG tablet Take 500 mg by mouth daily.       Marland Kitchen aspirin 81 MG tablet Take 81 mg by mouth daily.        . B Complex Vitamins (B COMPLEX 100 PO) Take by mouth daily.        . beta carotene 16109 UNIT capsule Take 25,000 Units by mouth daily.        . Bilberry 500 MG CAPS Take 1,000 mg by mouth daily.       . bimatoprost (LUMIGAN) 0.03 % ophthalmic drops Place 1 drop into both eyes at bedtime.        . Calcium-Magnesium-Zinc (CAL-MAG-ZINC PO) Take by mouth as directed.        . Chelated Zinc 50 MG TABS Take by mouth daily.        . Chromium Picolinate 500 MCG TABS Take by mouth daily.        . Cinnamon 500 MG capsule Take 1,000 mg by mouth  daily.       . Coenzyme Q10 (CO Q 10) 100 MG CAPS Take by mouth daily.        . dorzolamide (TRUSOPT) 2 % ophthalmic solution Place 1 drop into both eyes 2 (two) times daily as needed.      . doxazosin (CARDURA) 8 MG tablet Take 8 mg by mouth at bedtime.        . fish oil-omega-3 fatty acids 1000 MG capsule Take 2 g by mouth 3 (three) times daily.        Marland Kitchen FLUVIRIN INJ       . folic acid (FOLVITE) 1 MG tablet Take 1 mg by mouth daily.        . Garlic 1000 MG CAPS Take by mouth daily.        . Ginkgo Biloba (GINKOBA) 40 MG TABS Take by mouth daily.        Marland Kitchen HYDROcodone-acetaminophen (LORTAB) 10-500 MG per tablet       . levobunolol (BETAGAN) 0.5 % ophthalmic solution       . lisinopril (PRINIVIL,ZESTRIL) 40 MG tablet Take 40 mg by  mouth 2 (two) times daily.       . LUTEIN PO Take by mouth 2 (two) times daily.        . Melatonin 1 MG CAPS Take by mouth at bedtime.        . metoprolol (LOPRESSOR) 50 MG tablet Take 50 mg by mouth 2 (two) times daily.        . Multiple Vitamin (MULTIVITAMIN) tablet Take 1 tablet by mouth daily.        . potassium chloride SA (K-DUR,KLOR-CON) 20 MEQ tablet       . torsemide (DEMADEX) 20 MG tablet 10 mg daily.       . traMADol (ULTRAM) 50 MG tablet TAKE 1 OR 2 TABLETS BY MOUTH EVERY 4-6 HOURS AS NEEDED FOR PAIN  40 tablet  0  . dorzolamide-timolol (COSOPT) 22.3-6.8 MG/ML ophthalmic solution as directed.        Allergies  Allergen Reactions  . Cephalexin     REACTION: Hives  . Latanoprost     REACTION: Reaction not known.    Family History  Problem Relation Age of Onset  . Stroke Mother     History   Social History  . Marital Status: Married    Spouse Name: N/A    Number of Children: N/A  . Years of Education: N/A   Occupational History  . Not on file.   Social History Main Topics  . Smoking status: Former Smoker -- 2.5 packs/day for 25 years    Quit date: 09/14/1981  . Smokeless tobacco: Not on file  . Alcohol Use: Yes     occassional  .  Drug Use: No  . Sexually Active: Not on file   Other Topics Concern  . Not on file   Social History Narrative  . No narrative on file    ROS ALL NEGATIVE EXCEPT THOSE NOTED IN HPI  PE  General Appearance: well developed, well nourished in no acute distress, obese HEENT: symmetrical face, PERRLA, good dentition  Neck: no JVD, thyromegaly, or adenopathy, trachea midline, left carotid endarterectomy scar Chest: symmetric without deformity Cardiac: PMI non-displaced, RRR, normal S1, S2, no gallop or murmur Lung: clear to ausculation and percussion Vascular: all pulses full without bruits  Abdominal: nondistended, nontender, good bowel sounds, no HSM, no bruits Extremities: no cyanosis, clubbing or edema, no sign of DVT, no varicosities  Skin: normal color, no rashes Neuro: alert and oriented x 3, non-focal Pysch: normal affect  EKG Normal sinus, left atrial enlargement, poor R-wave progression, no acute changes. BMET    Component Value Date/Time   NA 136 03/25/2012 0933   NA 139 09/09/2011 1037   K 4.1 03/25/2012 0933   K 3.7 09/09/2011 1037   CL 96* 03/25/2012 0933   CL 101 09/09/2011 1037   CO2 26 03/25/2012 0933   CO2 27 09/09/2011 1037   GLUCOSE 105 03/25/2012 0933   GLUCOSE 134* 09/09/2011 1037   BUN 11 03/25/2012 0933   BUN 10 09/09/2011 1037   CREATININE 0.7 03/25/2012 0933   CREATININE 0.86 09/09/2011 1037   CALCIUM 8.7 03/25/2012 0933   CALCIUM 9.0 09/09/2011 1037   GFRNONAA >60 06/15/2011 0600   GFRAA >60 06/15/2011 0600    Lipid Panel  No results found for this basename: chol, trig, hdl, cholhdl, vldl, ldlcalc    CBC    Component Value Date/Time   WBC 5.7 03/25/2012 0933   WBC 9.0 06/15/2011 0600   RBC 4.23 03/25/2012 0933   RBC 3.54*  06/15/2011 0600   HGB 13.6 03/25/2012 0933   HGB 11.4* 06/15/2011 0600   HCT 38.8 03/25/2012 0933   HCT 32.1* 06/15/2011 0600   PLT 113* 03/25/2012 0933   PLT 112* 06/15/2011 0600   MCV 91.6 03/25/2012 0933   MCV 90.7 06/15/2011 0600    MCH 32.1 03/25/2012 0933   MCH 32.2 06/15/2011 0600   MCHC 35.0 03/25/2012 0933   MCHC 35.5 06/15/2011 0600   RDW 13.0 03/25/2012 0933   RDW 14.2 06/15/2011 0600   LYMPHSABS 1.0 03/25/2012 0933   MONOABS 0.5 03/25/2012 0933   EOSABS 0.3 03/25/2012 0933   BASOSABS 0.0 03/25/2012 0933

## 2012-04-08 ENCOUNTER — Encounter (INDEPENDENT_AMBULATORY_CARE_PROVIDER_SITE_OTHER): Payer: Medicare Other

## 2012-04-08 DIAGNOSIS — I6529 Occlusion and stenosis of unspecified carotid artery: Secondary | ICD-10-CM

## 2012-04-08 DIAGNOSIS — R0989 Other specified symptoms and signs involving the circulatory and respiratory systems: Secondary | ICD-10-CM

## 2012-06-25 ENCOUNTER — Encounter (HOSPITAL_COMMUNITY): Payer: Self-pay

## 2012-06-25 ENCOUNTER — Other Ambulatory Visit (HOSPITAL_BASED_OUTPATIENT_CLINIC_OR_DEPARTMENT_OTHER): Payer: Medicare Other | Admitting: Lab

## 2012-06-25 ENCOUNTER — Ambulatory Visit (HOSPITAL_COMMUNITY)
Admission: RE | Admit: 2012-06-25 | Discharge: 2012-06-25 | Disposition: A | Discharge status: Home / Self Care | Payer: Medicare Other | Source: Ambulatory Visit | Attending: Internal Medicine | Admitting: Internal Medicine

## 2012-06-25 DIAGNOSIS — J90 Pleural effusion, not elsewhere classified: Secondary | ICD-10-CM | POA: Insufficient documentation

## 2012-06-25 DIAGNOSIS — C3490 Malignant neoplasm of unspecified part of unspecified bronchus or lung: Secondary | ICD-10-CM

## 2012-06-25 DIAGNOSIS — K449 Diaphragmatic hernia without obstruction or gangrene: Secondary | ICD-10-CM | POA: Insufficient documentation

## 2012-06-25 DIAGNOSIS — K8020 Calculus of gallbladder without cholecystitis without obstruction: Secondary | ICD-10-CM | POA: Insufficient documentation

## 2012-06-25 DIAGNOSIS — I2510 Atherosclerotic heart disease of native coronary artery without angina pectoris: Secondary | ICD-10-CM | POA: Insufficient documentation

## 2012-06-25 DIAGNOSIS — Z951 Presence of aortocoronary bypass graft: Secondary | ICD-10-CM | POA: Insufficient documentation

## 2012-06-25 LAB — CBC WITH DIFFERENTIAL/PLATELET
Basophils Absolute: 0 10*3/uL (ref 0.0–0.1)
Eosinophils Absolute: 0.2 10*3/uL (ref 0.0–0.5)
HCT: 36.4 % — ABNORMAL LOW (ref 38.4–49.9)
HGB: 13.2 g/dL (ref 13.0–17.1)
LYMPH%: 20.1 % (ref 14.0–49.0)
MCV: 85.8 fL (ref 79.3–98.0)
MONO#: 0.5 10*3/uL (ref 0.1–0.9)
MONO%: 9 % (ref 0.0–14.0)
NEUT#: 3.6 10*3/uL (ref 1.5–6.5)
NEUT%: 66.7 % (ref 39.0–75.0)
Platelets: 115 10*3/uL — ABNORMAL LOW (ref 140–400)
RBC: 4.24 10*6/uL (ref 4.20–5.82)
WBC: 5.5 10*3/uL (ref 4.0–10.3)
nRBC: 0 % (ref 0–0)

## 2012-06-25 LAB — CMP (CANCER CENTER ONLY)
ALT(SGPT): 37 U/L (ref 10–47)
BUN, Bld: 11 mg/dL (ref 7–22)
CO2: 27 mEq/L (ref 18–33)
Calcium: 9.4 mg/dL (ref 8.0–10.3)
Chloride: 100 mEq/L (ref 98–108)
Creat: 0.8 mg/dl (ref 0.6–1.2)
Glucose, Bld: 106 mg/dL (ref 73–118)
Total Bilirubin: 1 mg/dl (ref 0.20–1.60)

## 2012-06-25 MED ORDER — IOHEXOL 300 MG/ML  SOLN
80.0000 mL | Freq: Once | INTRAMUSCULAR | Status: AC | PRN
  Administered 2012-06-25: 80 mL via INTRAVENOUS

## 2012-06-27 IMAGING — CR DG CHEST 1V PORT
1 series · 1 of 1 positions shown · non-contrast
Comparison: 06/11/2011

CLINICAL DATA: Lung cancer and post surgery.

PORTABLE CHEST - 1 VIEW

[AP]
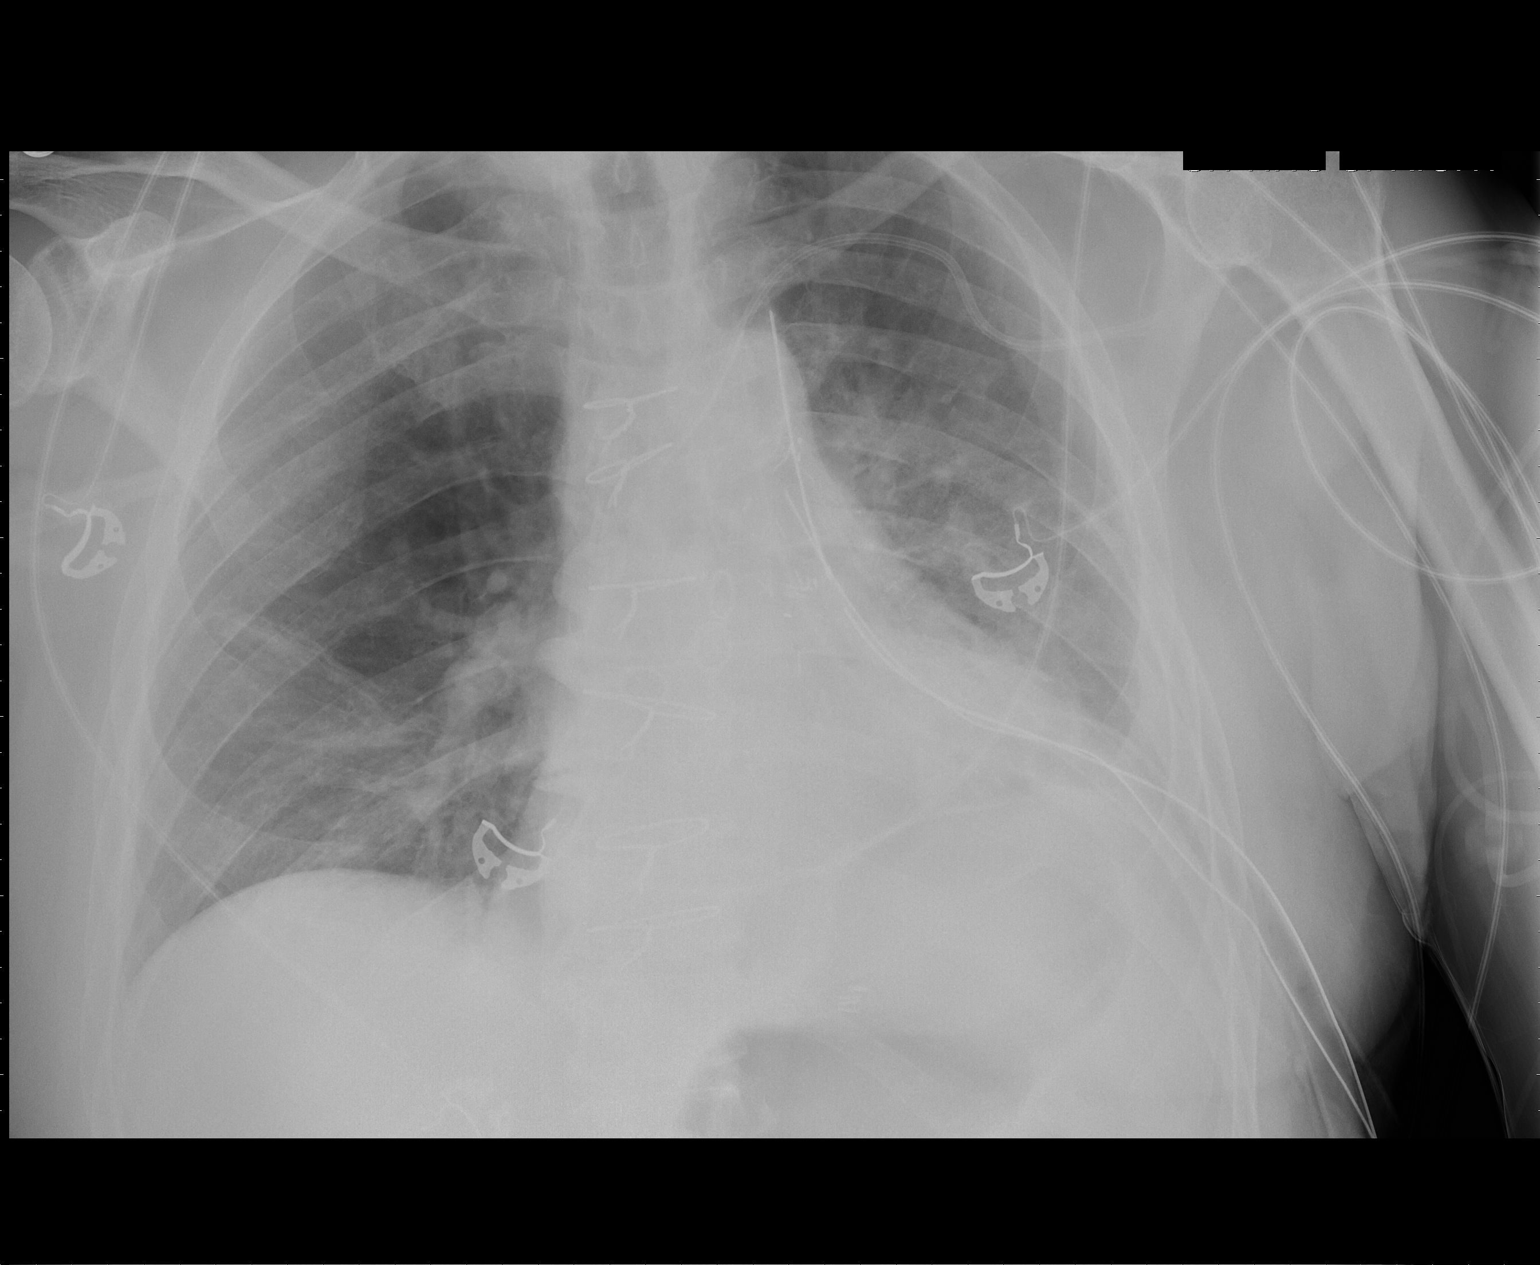

[1 of 1 positions shown; findings below may reference images not displayed]

FINDINGS: Left chest tubes remain in place.  There is no evidence
for a large pneumothorax.  Improved aeration at the right lung
base. Persistent hazy densities in the left lung and linear
densities in the right lung.  Findings could represent areas of
atelectasis and mild asymmetric edema.  Heart size is within normal
limits.  Median sternotomy wires are present.  Left subclavian
central line tip is near the junction of the left innominate vein
and SVC.
IMPRESSION: Improved aeration of the lungs.

Negative for a large pneumothorax.

## 2012-06-28 IMAGING — CR DG CHEST 1V PORT
1 series · 1 of 1 positions shown · non-contrast
Comparison: 06/12/2011

CLINICAL DATA: Postop.  Lung cancer.  Chest tubes.

PORTABLE CHEST - 1 VIEW

[view not recorded]
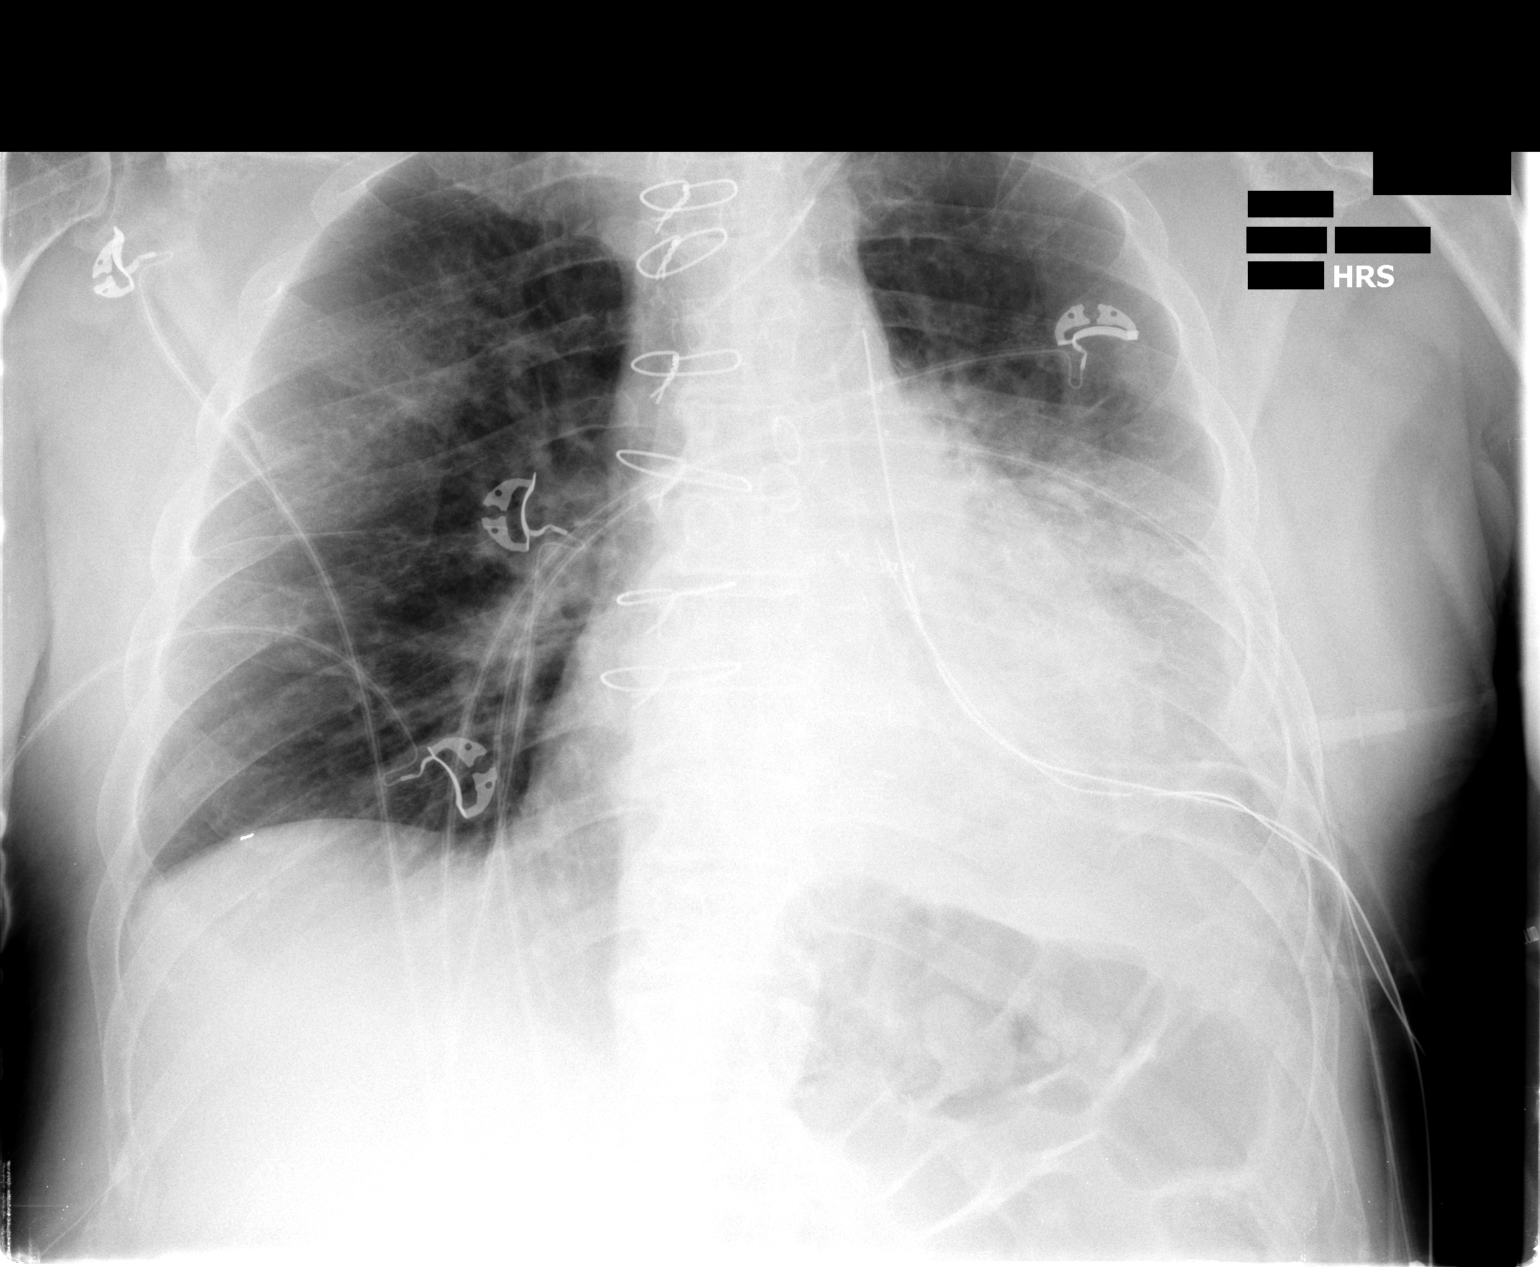

[1 of 1 positions shown; findings below may reference images not displayed]

FINDINGS: Left-sided subclavian line is unchanged. Prior median
sternotomy.  2 left chest tubes.  These are unchanged in position.
No pneumothorax.

Mild cardiomegaly.  Small left pleural effusion. No congestive
failure.

Improved right infrahilar atelectasis.  Slight worsening in left
lower lobe airspace disease.
IMPRESSION: 1.  Left-sided chest tubes remain in place, without pneumothorax.
2.  Improved right infrahilar atelectasis.
3.  Worsened left base aeration.  Favor atelectasis.
4.  Probable small left pleural effusion.

## 2012-06-29 ENCOUNTER — Telehealth: Payer: Self-pay | Admitting: Internal Medicine

## 2012-06-29 ENCOUNTER — Ambulatory Visit (HOSPITAL_BASED_OUTPATIENT_CLINIC_OR_DEPARTMENT_OTHER): Payer: Medicare Other | Admitting: Internal Medicine

## 2012-06-29 VITALS — BP 153/71 | HR 58 | Temp 96.9°F | Ht 67.0 in | Wt 189.2 lb

## 2012-06-29 DIAGNOSIS — C3490 Malignant neoplasm of unspecified part of unspecified bronchus or lung: Secondary | ICD-10-CM

## 2012-06-29 DIAGNOSIS — C3430 Malignant neoplasm of lower lobe, unspecified bronchus or lung: Secondary | ICD-10-CM

## 2012-06-29 NOTE — Progress Notes (Signed)
[facility] [phone]   Fax:([phone]  OFFICE PROGRESS NOTE  Rodney Canter, MD [address] [place] [address]  PRINCIPAL DIAGNOSIS: Stage IIB (T3 N0 M0) non-small cell lung cancer consistent with squamous cell carcinoma diagnosed in March of 2012.   PRIOR THERAPY:  1. Status post concurrent chemoradiation with weekly carboplatin and paclitaxel, last dose was given 04/08/2011.  2. Status post left lower lobectomy with mediastinal lymph node dissection under the care of Dr. Dorris Fetch on 06/11/2011 with no evidence for residual disease.  CURRENT THERAPY: Observation.  INTERVAL HISTORY: Rodney Christensen 71 y.o. male returns to the clinic today for routine three-month followup visit accompanied by his wife. The patient is doing fine today with no specific complaints. He denied having any significant weight loss or night sweats. He has no chest pain or shortness breath, no cough or hemoptysis. He has repeat CT scan of the chest performed recently and he is here today for evaluation and discussion of his scan results.  MEDICAL HISTORY: Past Medical History  Diagnosis Date  . CAD, NATIVE VESSEL 03/22/2010  . 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  . HYPERTENSION, UNSPECIFIED 03/17/2009  . HYPERLIPIDEMIA-MIXED 03/17/2009  . History of radiation therapy 03/18/11 to 04/19/11    lower left lung  . BPH (benign prostatic hyperplasia)   . Macular degeneration   . Diverticulosis   . Squamous cell carcinoma lung     Left lower lobe   . Skin cancer     melanoma-nose    ALLERGIES:  is allergic to cephalexin and latanoprost.  MEDICATIONS:  Current Outpatient Prescriptions  Medication Sig Dispense Refill  . amLODipine (NORVASC) 5 MG tablet Take 5 mg by mouth daily.        . Ascorbic Acid (VITAMIN C) 500 MG tablet Take 500 mg by mouth daily.       Marland Kitchen aspirin 81 MG tablet Take 81 mg by mouth daily.        . B Complex Vitamins (B COMPLEX 100 PO) Take by mouth  daily.        . beta carotene 81191 UNIT capsule Take 25,000 Units by mouth daily.        . Bilberry 500 MG CAPS Take 1,000 mg by mouth daily.       . bimatoprost (LUMIGAN) 0.03 % ophthalmic drops Place 1 drop into both eyes at bedtime.        . Calcium-Magnesium-Zinc (CAL-MAG-ZINC PO) Take by mouth as directed.        . Chelated Zinc 50 MG TABS Take by mouth daily.        . Chromium Picolinate 500 MCG TABS Take by mouth daily.        . Cinnamon 500 MG capsule Take 1,000 mg by mouth daily.       . Coenzyme Q10 (CO Q 10) 100 MG CAPS Take by mouth daily.        . dorzolamide (TRUSOPT) 2 % ophthalmic solution Place 1 drop into both eyes 2 (two) times daily as needed.      . dorzolamide-timolol (COSOPT) 22.3-6.8 MG/ML ophthalmic solution as directed.      . doxazosin (CARDURA) 8 MG tablet Take 8 mg by mouth at bedtime.        . fish oil-omega-3 fatty acids 1000 MG capsule Take 2 g by mouth 3 (three) times daily.        Marland Kitchen FLUVIRIN INJ       . folic acid (  FOLVITE) 1 MG tablet Take 1 mg by mouth daily.        . Garlic 1000 MG CAPS Take by mouth daily.        . Ginkgo Biloba (GINKOBA) 40 MG TABS Take by mouth daily.        Marland Kitchen HYDROcodone-acetaminophen (LORTAB) 10-500 MG per tablet       . levobunolol (BETAGAN) 0.5 % ophthalmic solution       . lisinopril (PRINIVIL,ZESTRIL) 40 MG tablet Take 40 mg by mouth 2 (two) times daily.       . LUTEIN PO Take by mouth 2 (two) times daily.        . Melatonin 1 MG CAPS Take by mouth at bedtime.        . metoprolol (LOPRESSOR) 50 MG tablet Take 50 mg by mouth 2 (two) times daily.        . Multiple Vitamin (MULTIVITAMIN) tablet Take 1 tablet by mouth daily.        . potassium chloride SA (K-DUR,KLOR-CON) 20 MEQ tablet       . simvastatin (ZOCOR) 20 MG tablet Take 20 mg by mouth every evening.      . torsemide (DEMADEX) 20 MG tablet 10 mg daily.       . traMADol (ULTRAM) 50 MG tablet TAKE 1 OR 2 TABLETS BY MOUTH EVERY 4-6 HOURS AS NEEDED FOR PAIN  40 tablet  0     SURGICAL HISTORY:  Past Surgical History  Procedure Date  . Coronary artery bypass graft 10/10/2004    x5  . Carotid endarterectomy   . Bronchoscopy 06/11/11    [facility]  . Lt vats,lt thoacotomy,lt lower lobectomy with  mediastinal node  dissection 06/11/11    [facility]rickson    REVIEW OF SYSTEMS:  A comprehensive review of systems was negative.   PHYSICAL EXAMINATION: General appearance: alert, cooperative and no distress Neck: no adenopathy Lymph nodes: Cervical, supraclavicular, and axillary nodes normal. Resp: clear to auscultation bilaterally Cardio: regular rate and rhythm, S1, S2 normal, no murmur, click, rub or gallop GI: soft, non-tender; bowel sounds normal; no masses,  no organomegaly Extremities: extremities normal, atraumatic, no cyanosis or edema Neurologic: Alert and oriented X 3, normal strength and tone. Normal symmetric reflexes. Normal coordination and gait  ECOG PERFORMANCE STATUS: 1 - Symptomatic but completely ambulatory  Blood pressure 153/71, pulse 58, temperature 96.9 F (36.1 C), temperature source Oral, height 5\' 7"  (1.702 m), weight 189 lb 3.2 oz (85.821 kg).  LABORATORY DATA: Lab Results  Component Value Date   WBC 5.5 06/25/2012   HGB 13.2 06/25/2012   HCT 36.4* 06/25/2012   MCV 85.8 06/25/2012   PLT 115* 06/25/2012      Chemistry      Component Value Date/Time   NA 141 06/25/2012 0800   NA 139 09/09/2011 1037   K 3.6 06/25/2012 0800   K 3.7 09/09/2011 1037   CL 100 06/25/2012 0800   CL 101 09/09/2011 1037   CO2 27 06/25/2012 0800   CO2 27 09/09/2011 1037   BUN 11 06/25/2012 0800   BUN 10 09/09/2011 1037   CREATININE 0.8 06/25/2012 0800   CREATININE 0.86 09/09/2011 1037      Component Value Date/Time   CALCIUM 9.4 06/25/2012 0800   CALCIUM 9.0 09/09/2011 1037   ALKPHOS 61 06/25/2012 0800   ALKPHOS 73 09/09/2011 1037   AST 24 06/25/2012 0800   AST 16 09/09/2011 1037   ALT 15 09/09/2011 1037   BILITOT 1.00  06/25/2012 0800   BILITOT 0.4 09/09/2011  1037       RADIOGRAPHIC STUDIES: Ct Chest W Contrast  06/25/2012  *RADIOLOGY REPORT*  Clinical Data: History of lung cancer diagnosed in February 2012 status post chemotherapy and radiation therapy, as well as left lower lobectomy.  CT CHEST WITH CONTRAST  Technique:  Multidetector CT imaging of the chest was performed following the standard protocol during bolus administration of intravenous contrast.  Contrast: 80mL OMNIPAQUE IOHEXOL 300 MG/ML  SOLN  Comparison: Chest CT 03/25/2012.  Findings:  Mediastinum: Heart size is normal. There is no significant pericardial fluid, thickening or pericardial calcification. There is atherosclerosis of the thoracic aorta, the great vessels of the mediastinum and the coronary arteries, including calcified atherosclerotic plaque in the left main, left anterior descending, left circumflex and right coronary arteries. The patient is status post median sternotomy for CABG with LIMA to the LAD. Calcifications of the aortic valve. No pathologically enlarged mediastinal or hilar lymph nodes. There is a small hiatal hernia.  Lungs/Pleura: Postoperative changes of the left lower lobectomy are again noted.  There is a small chronic left sided pleural effusion which has slightly decreased in size compared the prior examination.  An area of chronic volume loss, architectural distortion and extensive traction bronchiectasis (presumably chronic postradiation change) is again noted in the posterior aspect of the left upper lobe (unchanged).  No definite suspicious appearing pulmonary nodules or masses are identified on today's examination.  No acute consolidative airspace disease.  Upper Abdomen: Calcified gallstones in the dependent portion of the gallbladder, without evidence to suggest acute cholecystitis at this time.  Otherwise, unremarkable.  Musculoskeletal: Sternotomy wires.  Postoperative changes in the left ribs related to prior thoracotomy. There are no aggressive appearing  lytic or blastic lesions noted in the visualized portions of the skeleton.  IMPRESSION: 1.  Postoperative changes of left lower lobectomy with a chronic area of the postradiation change in the left upper lobe posteriorly (similar to prior), without evidence to suggest local recurrence of disease or new metastatic disease in the thorax. 2.  Small chronic left sided pleural effusion has slightly decreased. 3. Atherosclerosis, including left main and three-vessel coronary artery disease. The patient is status post median sternotomy for CABG with LIMA to the LAD. 4.  Cholelithiasis without findings to suggest acute cholecystitis. 5.  Small hiatal hernia.  Original Report Authenticated By: Florencia Reasons, M.D.    ASSESSMENT: This is a very pleasant 71 years old white male with history of stage IIb non-small cell lung cancer status post concurrent chemoradiation followed by left lower lobectomy with mediastinal lymph node dissection. The patient is doing fine and he has no evidence for disease recurrence.  PLAN: I discussed the scan results with the patient and his wife. I recommended for him to continue on observation for now with repeat CT scan of the chest with contrast in 4 months.  He was advised to call me immediately if he has any concerning symptoms in the interval.  All questions were answered. The patient knows to call the clinic with any problems, questions or concerns. We can certainly see the patient much sooner if necessary.

## 2012-06-29 NOTE — Telephone Encounter (Signed)
Gave pt appt for December 2013 lab, Ct then MD, appt date per patient request due to being out of town during Thanksgiving and and 1st week of December

## 2012-06-30 ENCOUNTER — Ambulatory Visit (INDEPENDENT_AMBULATORY_CARE_PROVIDER_SITE_OTHER): Payer: Medicare Other | Admitting: Thoracic Surgery (Cardiothoracic Vascular Surgery)

## 2012-06-30 ENCOUNTER — Encounter: Payer: Self-pay | Admitting: Thoracic Surgery (Cardiothoracic Vascular Surgery)

## 2012-06-30 VITALS — BP 129/75 | HR 60 | Resp 16 | Ht 66.0 in | Wt 183.0 lb

## 2012-06-30 DIAGNOSIS — Z09 Encounter for follow-up examination after completed treatment for conditions other than malignant neoplasm: Secondary | ICD-10-CM

## 2012-06-30 DIAGNOSIS — C3490 Malignant neoplasm of unspecified part of unspecified bronchus or lung: Secondary | ICD-10-CM

## 2012-06-30 NOTE — Progress Notes (Signed)
HPI:  Rodney Christensen is a 71 year old gentleman who had a left thoracotomy and left lower lobectomy following neoadjuvant therapy for stage IIb non-small cell carcinoma a year ago. He was last in the office in January at which time he was doing well. A CT at that time showed some radiation changes in the left upper lobe. He's been followed for that by CT scan through Dr. Asa Lente office in April and then again last week. There's been no interval change. He says that overall he's been feeling well. His weight has been stable.   Past Medical History  Diagnosis Date  . CAD, NATIVE VESSEL 03/22/2010  . 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  . HYPERTENSION, UNSPECIFIED 03/17/2009  . HYPERLIPIDEMIA-MIXED 03/17/2009  . History of radiation therapy 03/18/11 to 04/19/11    lower left lung  . BPH (benign prostatic hyperplasia)   . Macular degeneration   . Diverticulosis   . Squamous cell carcinoma lung     Left lower lobe   . Skin cancer     melanoma-nose    Current Outpatient Prescriptions  Medication Sig Dispense Refill  . amLODipine (NORVASC) 5 MG tablet Take 10 mg by mouth daily.       . Ascorbic Acid (VITAMIN C) 500 MG tablet Take 500 mg by mouth daily.       Marland Kitchen aspirin 81 MG tablet Take 81 mg by mouth daily.        . B Complex Vitamins (B COMPLEX 100 PO) Take by mouth daily.        . beta carotene 40981 UNIT capsule Take 25,000 Units by mouth daily.        . Bilberry 500 MG CAPS Take 500 mg by mouth daily.       . bimatoprost (LUMIGAN) 0.03 % ophthalmic drops Place 1 drop into both eyes at bedtime.        . Calcium-Magnesium-Zinc (CAL-MAG-ZINC PO) Take by mouth as directed.        . Chelated Zinc 50 MG TABS Take by mouth daily.        . Chromium Picolinate 500 MCG TABS Take by mouth daily.        . Cinnamon 500 MG capsule Take 1,000 mg by mouth daily.       . Coenzyme Q10 (CO Q 10) 100 MG CAPS Take by mouth daily.        . dorzolamide (TRUSOPT) 2 % ophthalmic solution Place 1 drop into both eyes 2  (two) times daily as needed.      . dorzolamide-timolol (COSOPT) 22.3-6.8 MG/ML ophthalmic solution as directed.      . doxazosin (CARDURA) 8 MG tablet Take 8 mg by mouth at bedtime.        . fish oil-omega-3 fatty acids 1000 MG capsule Take 2 g by mouth 3 (three) times daily.        Marland Kitchen FLUVIRIN INJ       . folic acid (FOLVITE) 1 MG tablet Take 1 mg by mouth daily.        . Garlic 1000 MG CAPS Take by mouth daily.        Marland Kitchen lisinopril (PRINIVIL,ZESTRIL) 40 MG tablet Take 40 mg by mouth 2 (two) times daily.       . LUTEIN PO Take by mouth 2 (two) times daily.        . Melatonin 1 MG CAPS Take by mouth at bedtime.        . metoprolol (LOPRESSOR) 50 MG tablet Take  50 mg by mouth 2 (two) times daily.        . Multiple Vitamin (MULTIVITAMIN) tablet Take 1 tablet by mouth daily.        . potassium chloride SA (K-DUR,KLOR-CON) 20 MEQ tablet       . simvastatin (ZOCOR) 20 MG tablet Take 20 mg by mouth every evening.      . torsemide (DEMADEX) 20 MG tablet 20 mg daily.       . traMADol (ULTRAM) 50 MG tablet TAKE 1 OR 2 TABLETS BY MOUTH EVERY 4-6 HOURS AS NEEDED FOR PAIN  40 tablet  0  . Ginkgo Biloba (GINKOBA) 40 MG TABS Take by mouth daily.         Review of systems Denies headache, visual changes, incisional pain, shortness of breath, wheezing, hemoptysis, significant weight change.  Physical Exam BP 129/75  Pulse 60  Resp 16  Ht 5\' 6"  (1.676 m)  Wt 183 lb (83.008 kg)  BMI 29.54 kg/m2  SpO2 97% Gen. 71 year old male in no acute distress No cervical, supraclavicular, or epitrochlear adenopathy Lungs diminished at left base Cardiac regular rate and rhythm Sternal and thoracotomy incision is well-healed  Diagnostic Tests: CT of chest 06/25/2012  IMPRESSION:  1. Postoperative changes of left lower lobectomy with a chronic  area of the postradiation change in the left upper lobe posteriorly  (similar to prior), without evidence to suggest local recurrence of  disease or new metastatic  disease in the thorax.  2. Small chronic left sided pleural effusion has slightly  decreased.  3. Atherosclerosis, including left main and three-vessel coronary  artery disease. The patient is status post median sternotomy for  CABG with LIMA to the LAD.  4. Cholelithiasis without findings to suggest acute cholecystitis.  5. Small hiatal hernia.   Impression: Rodney Christensen is a 71 year old gentleman with a history of stage IIb non-small cell carcinoma of the left lower lobe. He was treated with neoadjuvant chemotherapy and radiation therapy and then underwent a lobectomy. He now is one year out from surgery with no evidence recurrent disease. His functional status is good.  Dr. Arbutus Ped and scheduled a CT of the chest for December in followup. I will plan to see him back at the first of the year.  Plan: Return in 6 months.

## 2012-07-01 IMAGING — CR DG CHEST 2V
2 series · 2 of 2 positions shown · non-contrast
Comparison: 06/15/2011

CLINICAL DATA: Postop.  Shortness of breath.

CHEST - 2 VIEW

[w chest pa]
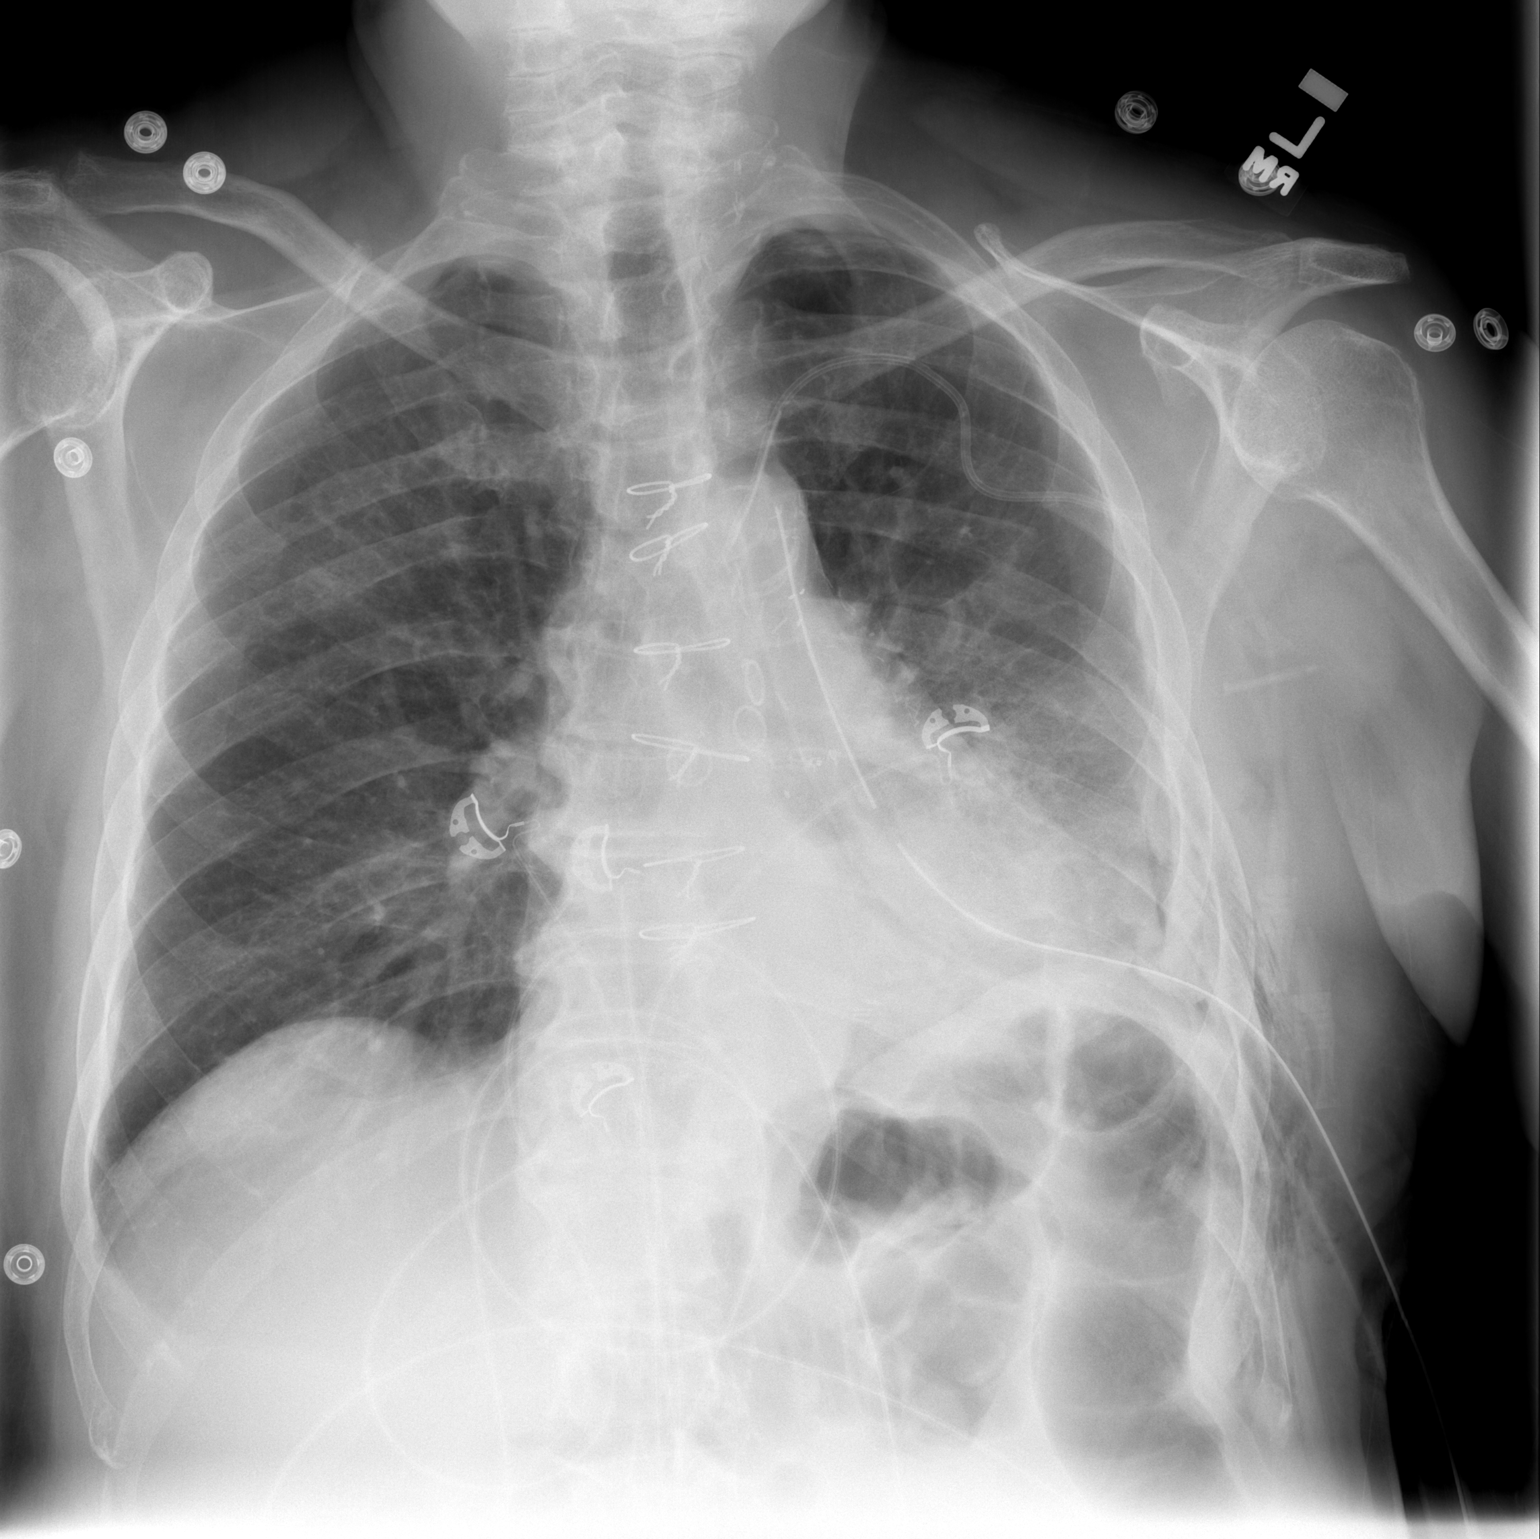

[w chest lat]
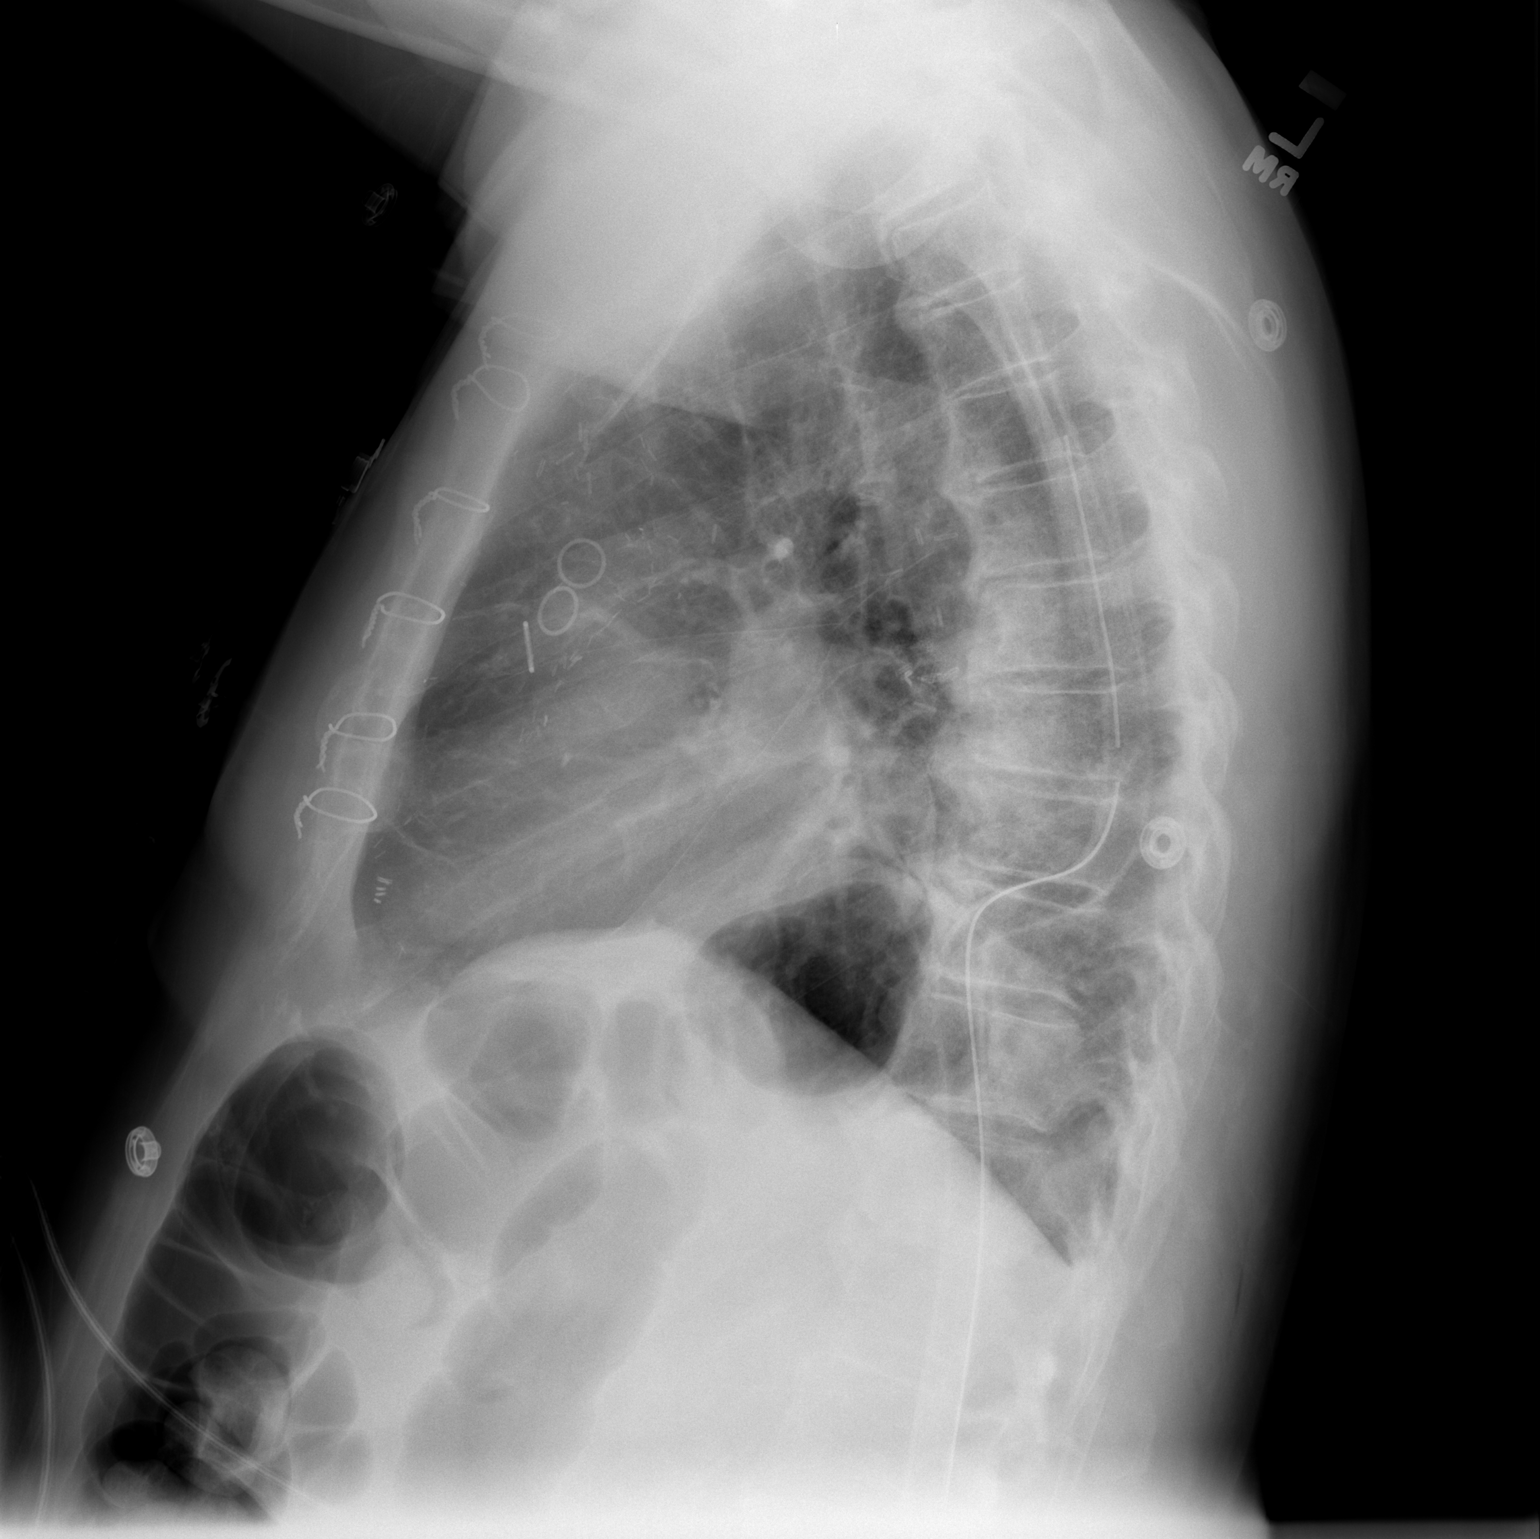

[2 of 2 positions shown; findings below may reference images not displayed]

FINDINGS: There is a left subclavian catheter with tip in the
projection of the innominate vein.

There is a left-sided chest tube.  Small left basilar pneumothorax
is identified measuring approximately 10%.

There is asymmetric decreased opacification of the left lung base.

Chest wall emphysema is noted similar to previous exam.
IMPRESSION: 1.  Persistent left basilar pneumothorax and left base airspace
consolidation.

## 2012-11-11 ENCOUNTER — Ambulatory Visit (HOSPITAL_COMMUNITY)
Admission: RE | Admit: 2012-11-11 | Discharge: 2012-11-11 | Disposition: A | Discharge status: Home / Self Care | Payer: Medicare Other | Source: Ambulatory Visit | Attending: Internal Medicine | Admitting: Internal Medicine

## 2012-11-11 ENCOUNTER — Other Ambulatory Visit (HOSPITAL_BASED_OUTPATIENT_CLINIC_OR_DEPARTMENT_OTHER): Payer: Medicare Other | Admitting: Lab

## 2012-11-11 DIAGNOSIS — C3430 Malignant neoplasm of lower lobe, unspecified bronchus or lung: Secondary | ICD-10-CM

## 2012-11-11 DIAGNOSIS — I2510 Atherosclerotic heart disease of native coronary artery without angina pectoris: Secondary | ICD-10-CM | POA: Insufficient documentation

## 2012-11-11 DIAGNOSIS — C3490 Malignant neoplasm of unspecified part of unspecified bronchus or lung: Secondary | ICD-10-CM | POA: Insufficient documentation

## 2012-11-11 DIAGNOSIS — Z9221 Personal history of antineoplastic chemotherapy: Secondary | ICD-10-CM | POA: Insufficient documentation

## 2012-11-11 DIAGNOSIS — K449 Diaphragmatic hernia without obstruction or gangrene: Secondary | ICD-10-CM | POA: Insufficient documentation

## 2012-11-11 DIAGNOSIS — Z923 Personal history of irradiation: Secondary | ICD-10-CM | POA: Insufficient documentation

## 2012-11-11 LAB — COMPREHENSIVE METABOLIC PANEL (CC13)
ALT: 20 U/L (ref 0–55)
CO2: 29 mEq/L (ref 22–29)
Calcium: 9.6 mg/dL (ref 8.4–10.4)
Chloride: 105 mEq/L (ref 98–107)
Creatinine: 0.9 mg/dL (ref 0.7–1.3)
Glucose: 113 mg/dl — ABNORMAL HIGH (ref 70–99)
Sodium: 141 mEq/L (ref 136–145)
Total Protein: 7.3 g/dL (ref 6.4–8.3)

## 2012-11-11 LAB — CBC WITH DIFFERENTIAL/PLATELET
BASO%: 0.7 % (ref 0.0–2.0)
Eosinophils Absolute: 0.2 10*3/uL (ref 0.0–0.5)
HCT: 37.9 % — ABNORMAL LOW (ref 38.4–49.9)
LYMPH%: 17.6 % (ref 14.0–49.0)
MCHC: 35.2 g/dL (ref 32.0–36.0)
MONO#: 0.5 10*3/uL (ref 0.1–0.9)
NEUT#: 3.9 10*3/uL (ref 1.5–6.5)
NEUT%: 69.1 % (ref 39.0–75.0)
Platelets: 117 10*3/uL — ABNORMAL LOW (ref 140–400)
WBC: 5.7 10*3/uL (ref 4.0–10.3)
lymph#: 1 10*3/uL (ref 0.9–3.3)

## 2012-11-11 MED ORDER — IOHEXOL 300 MG/ML  SOLN
80.0000 mL | Freq: Once | INTRAMUSCULAR | Status: AC | PRN
  Administered 2012-11-11: 80 mL via INTRAVENOUS

## 2012-11-12 ENCOUNTER — Ambulatory Visit (HOSPITAL_BASED_OUTPATIENT_CLINIC_OR_DEPARTMENT_OTHER): Payer: Medicare Other | Admitting: Internal Medicine

## 2012-11-12 ENCOUNTER — Telehealth: Payer: Self-pay | Admitting: Internal Medicine

## 2012-11-12 VITALS — BP 115/63 | HR 59 | Temp 97.0°F | Resp 20 | Ht 66.0 in | Wt 185.9 lb

## 2012-11-12 DIAGNOSIS — C3430 Malignant neoplasm of lower lobe, unspecified bronchus or lung: Secondary | ICD-10-CM

## 2012-11-12 DIAGNOSIS — C3490 Malignant neoplasm of unspecified part of unspecified bronchus or lung: Secondary | ICD-10-CM

## 2012-11-12 NOTE — Patient Instructions (Signed)
The scan showed no evidence for disease recurrence. Followup in 6 months with repeat CT scan of the chest.

## 2012-11-12 NOTE — Progress Notes (Signed)
[facility] [phone]   [phone]  OFFICE PROGRESS NOTE  Rodney Canter, MD [address] 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   PRIOR THERAPY:  1. Status post concurrent chemoradiation with weekly carboplatin and paclitaxel, last dose was given 04/08/2011.  2. Status post left lower lobectomy with mediastinal lymph node dissection under the care of Dr. Dorris Fetch on 06/11/2011 with no evidence for residual disease.  CURRENT THERAPY: Observation.  INTERVAL HISTORY: Rodney Christensen 71 y.o. male returns to the clinic today for routine 4 month followup visit accompanied by his wife. The patient is feeling fine today with no specific complaints. He denied having any significant weight loss or night sweats. He has no chest pain, shortness breath, cough or hemoptysis. The patient and had repeat CT scan of the chest performed recently and he is here today for evaluation and discussion of his scan results.  MEDICAL HISTORY: Past Medical History  Diagnosis Date  . CAD, NATIVE VESSEL 03/22/2010  . 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  . HYPERTENSION, UNSPECIFIED 03/17/2009  . HYPERLIPIDEMIA-MIXED 03/17/2009  . History of radiation therapy 03/18/11 to 04/19/11    lower left lung  . BPH (benign prostatic hyperplasia)   . Macular degeneration   . Diverticulosis   . Squamous cell carcinoma lung     Left lower lobe   . Skin cancer     melanoma-nose    ALLERGIES:  is allergic to cephalexin and latanoprost.  MEDICATIONS:  Current Outpatient Prescriptions  Medication Sig Dispense Refill  . amLODipine (NORVASC) 10 MG tablet Take 10 mg by mouth daily.      . Ascorbic Acid (VITAMIN C) 500 MG tablet Take 500 mg by mouth daily.       Marland Kitchen aspirin 81 MG tablet Take 81 mg by mouth daily.        . B Complex Vitamins (B COMPLEX 100 PO) Take by  mouth daily.        . beta carotene 86578 UNIT capsule Take 25,000 Units by mouth daily.        . Bilberry 500 MG CAPS Take 500 mg by mouth daily.       . bimatoprost (LUMIGAN) 0.03 % ophthalmic drops Place 1 drop into both eyes at bedtime.        . Calcium-Magnesium-Zinc (CAL-MAG-ZINC PO) Take by mouth as directed.        . Chelated Zinc 50 MG TABS Take by mouth daily.        . Chromium Picolinate 500 MCG TABS Take by mouth daily.        . Cinnamon 500 MG capsule Take 1,000 mg by mouth daily.       . Coenzyme Q10 (CO Q 10) 100 MG CAPS Take by mouth daily.        . dorzolamide (TRUSOPT) 2 % ophthalmic solution Place 1 drop into both eyes 2 (two) times daily as needed.      . dorzolamide-timolol (COSOPT) 22.3-6.8 MG/ML ophthalmic solution as directed.      . doxazosin (CARDURA) 8 MG tablet Take 8 mg by mouth at bedtime.        . fish oil-omega-3 fatty acids 1000 MG capsule Take 2 g by mouth 3 (three) times daily.        Marland Kitchen FLUVIRIN INJ       . folic acid (FOLVITE)  1 MG tablet Take 1 mg by mouth daily.        . Garlic 1000 MG CAPS Take by mouth daily.        . Ginkgo Biloba (GINKOBA) 40 MG TABS Take by mouth daily.        Marland Kitchen lisinopril (PRINIVIL,ZESTRIL) 40 MG tablet Take 40 mg by mouth 2 (two) times daily.       . LUTEIN PO Take by mouth 2 (two) times daily.        . Melatonin 1 MG CAPS Take by mouth at bedtime.        . metoprolol (LOPRESSOR) 50 MG tablet Take 50 mg by mouth 2 (two) times daily.        . Multiple Vitamin (MULTIVITAMIN) tablet Take 1 tablet by mouth daily.        . potassium chloride SA (K-DUR,KLOR-CON) 20 MEQ tablet       . simvastatin (ZOCOR) 20 MG tablet Take 20 mg by mouth every evening.      . torsemide (DEMADEX) 20 MG tablet 20 mg daily.       . traMADol (ULTRAM) 50 MG tablet TAKE 1 OR 2 TABLETS BY MOUTH EVERY 4-6 HOURS AS NEEDED FOR PAIN  40 tablet  0   No current facility-administered medications for this visit.   Facility-Administered Medications Ordered in Other  Visits  Medication Dose Route Frequency Provider Last Rate Last Dose  . [COMPLETED] iohexol (OMNIPAQUE) 300 MG/ML solution 80 mL  80 mL Intravenous Once PRN Medication Radiologist, MD   80 mL at 11/11/12 0934    SURGICAL HISTORY:  Past Surgical History  Procedure Date  . Coronary artery bypass graft 10/10/2004    x5  . Carotid endarterectomy   . Bronchoscopy 06/11/11    [facility]  . Lt vats,lt thoacotomy,lt lower lobectomy with  mediastinal node  dissection 06/11/11    [facility]    REVIEW OF SYSTEMS:  A comprehensive review of systems was negative.   PHYSICAL EXAMINATION: General appearance: alert, cooperative and no distress Head: Normocephalic, without obvious abnormality, atraumatic Neck: no adenopathy Resp: clear to auscultation bilaterally Cardio: regular rate and rhythm, S1, S2 normal, no murmur, click, rub or gallop GI: soft, non-tender; bowel sounds normal; no masses,  no organomegaly Extremities: extremities normal, atraumatic, no cyanosis or edema  ECOG PERFORMANCE STATUS: 0 - Asymptomatic  Blood pressure 115/63, pulse 59, temperature 97 F (36.1 C), temperature source Oral, resp. rate 20, height 5\' 6"  (1.676 m), weight 185 lb 14.4 oz (84.324 kg).  LABORATORY DATA: Lab Results  Component Value Date   WBC 5.7 11/11/2012   HGB 13.4 11/11/2012   HCT 37.9* 11/11/2012   MCV 91.4 11/11/2012   PLT 117* 11/11/2012      Chemistry      Component Value Date/Time   NA 141 11/11/2012 0815   NA 141 06/25/2012 0800   NA 139 09/09/2011 1037   K 3.6 11/11/2012 0815   K 3.6 06/25/2012 0800   K 3.7 09/09/2011 1037   CL 105 11/11/2012 0815   CL 100 06/25/2012 0800   CL 101 09/09/2011 1037   CO2 29 11/11/2012 0815   CO2 27 06/25/2012 0800   CO2 27 09/09/2011 1037   BUN 10.0 11/11/2012 0815   BUN 11 06/25/2012 0800   BUN 10 09/09/2011 1037   CREATININE 0.9 11/11/2012 0815   CREATININE 0.8 06/25/2012 0800   CREATININE 0.86 09/09/2011 1037      Component Value Date/Time  CALCIUM 9.6 11/11/2012 0815   CALCIUM 9.4 06/25/2012 0800   CALCIUM 9.0 09/09/2011 1037   ALKPHOS 73 11/11/2012 0815   ALKPHOS 61 06/25/2012 0800   ALKPHOS 73 09/09/2011 1037   AST 17 11/11/2012 0815   AST 24 06/25/2012 0800   AST 16 09/09/2011 1037   ALT 20 11/11/2012 0815   ALT 15 09/09/2011 1037   BILITOT 0.78 11/11/2012 0815   BILITOT 1.00 06/25/2012 0800   BILITOT 0.4 09/09/2011 1037       RADIOGRAPHIC STUDIES: Ct Chest W Contrast  11/11/2012  *RADIOLOGY REPORT*  Clinical Data: History of lung cancer diagnosed in February 2012 status post left lower lobectomy, chemotherapy and radiation therapy.  Restaging scan.  CT CHEST WITH CONTRAST  Technique:  Multidetector CT imaging of the chest was performed following the standard protocol during bolus administration of intravenous contrast.  Contrast: 80mL OMNIPAQUE IOHEXOL 300 MG/ML  SOLN  Comparison: Multiple priors, most recently chest CT 06/25/2012.  Findings:  Mediastinum: Heart size is borderline enlarged. There is no significant pericardial fluid, thickening or pericardial calcification. There is atherosclerosis of the thoracic aorta, the great vessels of the mediastinum and the coronary arteries, including calcified atherosclerotic plaque in the left main, left anterior descending, left circumflex and right coronary arteries. The patient is status post median sternotomy for CABG with a LIMA to the LAD.  Calcifications of the aortic valve, mitral valve and mitral annulus. No pathologically enlarged mediastinal or hilar lymph nodes. There is a small hiatal hernia.  Lungs/Pleura: Status post left lower lobectomy. In the inferior aspect of the left upper lobe there is extensive cylindrical and varicose bronchiectasis with chronic airspace consolidation and architectural distortion, most compatible with chronic postradiation changes.  There is a small amount of nodular bilateral apical pleuroparenchymal thickening which is unchanged compared to priors of  most compatible with scarring.  No definite suspicious appearing pulmonary nodules or masses are identified on today's examination.  No right-sided pleural effusion.  Chronic partially loculated left-sided pleural effusion (small - moderate size) is similar to the prior examination.  There is a small amount of thin pleural enhancement which appears unchanged compared to the prior examination, but no definite pleural nodularity or thicker pleural enhancement is identified to suggest malignant involvement.  Upper Abdomen: Unremarkable.  Musculoskeletal: Sternotomy wires.  Post thoracotomy changes are noted in the left ribs. There are no aggressive appearing lytic or blastic lesions noted in the visualized portions of the skeleton.  IMPRESSION: 1.  Status post left lower lobectomy with chronic postradiation changes in the lower left hemithorax and small - moderate left- sided chronic pleural effusion.  These findings are unchanged.  No evidence to suggest local recurrence of disease or new metastatic disease in the thorax. 2.  Small hiatal hernia. 3. Atherosclerosis, including left main and three-vessel coronary artery disease.   The patient is status post median sternotomy for CABG with a LIMA to the LAD.   Original Report Authenticated By: Trudie Reed, M.D.     ASSESSMENT: This is a very pleasant 71 years old white male with history of stage IIb non-small cell lung cancer status post concurrent chemoradiation followed by left lower lobectomy with lymph node dissection. The patient has been observation since July 2012 with no evidence for disease recurrence.   PLAN: I discussed the scan results with the patient and his wife.  I recommended for him continuous observation for now with repeat CT scan of the chest with contrast in 6 months. The patient  was advised to call me immediately if he has any concerning symptoms in the interval.  All questions were answered. The patient knows to call the clinic with  any problems, questions or concerns. We can certainly see the patient much sooner if necessary.

## 2012-11-12 NOTE — Telephone Encounter (Signed)
appts made and printed for pt Pt aware they will get a call/letter with the scan appt

## 2012-12-22 ENCOUNTER — Ambulatory Visit (INDEPENDENT_AMBULATORY_CARE_PROVIDER_SITE_OTHER): Payer: Medicare Other | Admitting: Thoracic Surgery (Cardiothoracic Vascular Surgery)

## 2012-12-22 ENCOUNTER — Encounter: Payer: Self-pay | Admitting: Thoracic Surgery (Cardiothoracic Vascular Surgery)

## 2012-12-22 VITALS — BP 122/74 | HR 58 | Resp 16 | Ht 66.0 in | Wt 180.0 lb

## 2012-12-22 DIAGNOSIS — Z09 Encounter for follow-up examination after completed treatment for conditions other than malignant neoplasm: Secondary | ICD-10-CM

## 2012-12-22 DIAGNOSIS — C3430 Malignant neoplasm of lower lobe, unspecified bronchus or lung: Secondary | ICD-10-CM

## 2012-12-22 NOTE — Progress Notes (Signed)
HPI:  Rodney Christensen returns today for a scheduled 6 month followup visit.  Rodney Christensen is a 72 year old gentleman who had a stage III non-small cell carcinoma of the left lower lobe. He was treated with preoperative adjuvant chemoradiation and then underwent a lower lobectomy in July of 2012. He was last in the office about 6 months ago at which time he was doing well with no evidence recurrent disease. He says that in the interim he's been feeling well. He has not had any in usual cough or hemoptysis. He says his weight has been stable and actually may have gained a few pounds. His appetite is good. He's not had any new bone or joint pain or any new unusual headaches or visual changes.  Past Medical History  Diagnosis Date  . CAD, NATIVE VESSEL 03/22/2010  . 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  . HYPERTENSION, UNSPECIFIED 03/17/2009  . HYPERLIPIDEMIA-MIXED 03/17/2009  . History of radiation therapy 03/18/11 to 04/19/11    lower left lung  . BPH (benign prostatic hyperplasia)   . Macular degeneration   . Diverticulosis   . Squamous cell carcinoma lung     Left lower lobe   . Skin cancer     melanoma-nose     Current Outpatient Prescriptions  Medication Sig Dispense Refill  . amLODipine (NORVASC) 10 MG tablet Take 10 mg by mouth daily.      . Ascorbic Acid (VITAMIN C) 500 MG tablet Take 500 mg by mouth daily.       Marland Kitchen aspirin 81 MG tablet Take 81 mg by mouth daily.        . B Complex Vitamins (B COMPLEX 100 PO) Take by mouth daily.        . beta carotene 10960 UNIT capsule Take 25,000 Units by mouth daily.        . Bilberry 500 MG CAPS Take 500 mg by mouth daily.       . bimatoprost (LUMIGAN) 0.03 % ophthalmic drops Place 1 drop into both eyes at bedtime.        . Calcium-Magnesium-Zinc (CAL-MAG-ZINC PO) Take by mouth as directed.        . Chelated Zinc 50 MG TABS Take by mouth daily.        . Chromium Picolinate 500 MCG TABS Take by mouth daily.        . Cinnamon 500 MG capsule Take 1,000 mg by  mouth daily.       . Coenzyme Q10 (CO Q 10) 100 MG CAPS Take by mouth daily.        . dorzolamide (TRUSOPT) 2 % ophthalmic solution Place 1 drop into both eyes 2 (two) times daily as needed.      . dorzolamide-timolol (COSOPT) 22.3-6.8 MG/ML ophthalmic solution as directed.      . doxazosin (CARDURA) 8 MG tablet Take 8 mg by mouth at bedtime.        . fish oil-omega-3 fatty acids 1000 MG capsule Take 2 g by mouth 3 (three) times daily.        Marland Kitchen FLUVIRIN INJ       . folic acid (FOLVITE) 1 MG tablet Take 1 mg by mouth daily.        . Garlic 1000 MG CAPS Take by mouth daily.        . Ginkgo Biloba (GINKOBA) 40 MG TABS Take by mouth daily.        Marland Kitchen lisinopril (PRINIVIL,ZESTRIL) 40 MG tablet Take 40 mg by mouth 2 (  two) times daily.       . LUTEIN PO Take by mouth 2 (two) times daily.        . Melatonin 1 MG CAPS Take by mouth at bedtime.        . metoprolol (LOPRESSOR) 50 MG tablet Take 50 mg by mouth 2 (two) times daily.        . Multiple Vitamin (MULTIVITAMIN) tablet Take 1 tablet by mouth daily.        . potassium chloride SA (K-DUR,KLOR-CON) 20 MEQ tablet       . simvastatin (ZOCOR) 20 MG tablet Take 20 mg by mouth every evening.      . torsemide (DEMADEX) 20 MG tablet 20 mg daily.       . traMADol (ULTRAM) 50 MG tablet TAKE 1 OR 2 TABLETS BY MOUTH EVERY 4-6 HOURS AS NEEDED FOR PAIN  40 tablet  0    Physical Exam BP 122/74  Pulse 58  Resp 16  Ht 5\' 6"  (1.676 m)  Wt 180 lb (81.647 kg)  BMI 29.05 kg/m2  SpO2 98% Gen. 73 year old male in no acute distress No cervical or supraclavicular, axillary or epitrochlear adenopathy Lungs clear with essentially equal breath sounds bilaterally Cardiac regular rate and rhythm normal S1 and S2 no rubs murmurs or gallops  Diagnostic Tests: CT chest 11/11/2012 unchanged from his previous scan no evidence recurrent disease *RADIOLOGY REPORT*  Clinical Data: History of lung cancer diagnosed in February 2012  status post left lower lobectomy,  chemotherapy and radiation  therapy. Restaging scan.  CT CHEST WITH CONTRAST  Technique: Multidetector CT imaging of the chest was performed  following the standard protocol during bolus administration of  intravenous contrast.  Contrast: 80mL OMNIPAQUE IOHEXOL 300 MG/ML SOLN  Comparison: Multiple priors, most recently chest CT 06/25/2012.  Findings:  Mediastinum: Heart size is borderline enlarged. There is no  significant pericardial fluid, thickening or pericardial  calcification. There is atherosclerosis of the thoracic aorta, the  great vessels of the mediastinum and the coronary arteries,  including calcified atherosclerotic plaque in the left main, left  anterior descending, left circumflex and right coronary arteries.  The patient is status post median sternotomy for CABG with a LIMA  to the LAD. Calcifications of the aortic valve, mitral valve and  mitral annulus. No pathologically enlarged mediastinal or hilar  lymph nodes. There is a small hiatal hernia.  Lungs/Pleura: Status post left lower lobectomy. In the inferior  aspect of the left upper lobe there is extensive cylindrical and  varicose bronchiectasis with chronic airspace consolidation and  architectural distortion, most compatible with chronic  postradiation changes. There is a small amount of nodular  bilateral apical pleuroparenchymal thickening which is unchanged  compared to priors of most compatible with scarring. No definite  suspicious appearing pulmonary nodules or masses are identified on  today's examination. No right-sided pleural effusion. Chronic  partially loculated left-sided pleural effusion (small - moderate  size) is similar to the prior examination. There is a small amount  of thin pleural enhancement which appears unchanged compared to the  prior examination, but no definite pleural nodularity or thicker  pleural enhancement is identified to suggest malignant involvement.  Upper Abdomen:  Unremarkable.  Musculoskeletal: Sternotomy wires. Post thoracotomy changes are  noted in the left ribs. There are no aggressive appearing lytic or  blastic lesions noted in the visualized portions of the skeleton.  IMPRESSION:  1. Status post left lower lobectomy with chronic postradiation  changes in the  lower left hemithorax and small - moderate left-  sided chronic pleural effusion. These findings are unchanged. No  evidence to suggest local recurrence of disease or new metastatic  disease in the thorax.  2. Small hiatal hernia.  3. Atherosclerosis, including left main and three-vessel coronary  artery disease. The patient is status post median sternotomy for  CABG with a LIMA to the LAD.   Impression: 72 year old gentleman now about 18 months out from resection after neoadjuvant chemoradiation therapy. He has no evidence of recurrent disease. Overall he feels well.   Plan: I will plan to see him back in 6 months for 2 year followup visit.

## 2012-12-29 ENCOUNTER — Ambulatory Visit: Payer: Medicare Other | Admitting: Thoracic Surgery (Cardiothoracic Vascular Surgery)

## 2013-05-12 ENCOUNTER — Ambulatory Visit: Payer: Medicare Other | Admitting: Cardiology

## 2013-05-17 ENCOUNTER — Ambulatory Visit (HOSPITAL_COMMUNITY)
Admission: RE | Admit: 2013-05-17 | Discharge: 2013-05-17 | Disposition: A | Discharge status: Home / Self Care | Payer: Medicare Other | Source: Ambulatory Visit | Attending: Internal Medicine | Admitting: Internal Medicine

## 2013-05-17 ENCOUNTER — Other Ambulatory Visit (HOSPITAL_BASED_OUTPATIENT_CLINIC_OR_DEPARTMENT_OTHER): Payer: Medicare Other

## 2013-05-17 ENCOUNTER — Encounter (HOSPITAL_COMMUNITY): Payer: Self-pay

## 2013-05-17 DIAGNOSIS — C3490 Malignant neoplasm of unspecified part of unspecified bronchus or lung: Secondary | ICD-10-CM

## 2013-05-17 DIAGNOSIS — K449 Diaphragmatic hernia without obstruction or gangrene: Secondary | ICD-10-CM | POA: Insufficient documentation

## 2013-05-17 DIAGNOSIS — J479 Bronchiectasis, uncomplicated: Secondary | ICD-10-CM | POA: Insufficient documentation

## 2013-05-17 DIAGNOSIS — K8020 Calculus of gallbladder without cholecystitis without obstruction: Secondary | ICD-10-CM | POA: Insufficient documentation

## 2013-05-17 DIAGNOSIS — R059 Cough, unspecified: Secondary | ICD-10-CM | POA: Insufficient documentation

## 2013-05-17 DIAGNOSIS — R05 Cough: Secondary | ICD-10-CM | POA: Insufficient documentation

## 2013-05-17 LAB — CBC WITH DIFFERENTIAL/PLATELET
BASO%: 0.9 % (ref 0.0–2.0)
EOS%: 4.6 % (ref 0.0–7.0)
Eosinophils Absolute: 0.3 10*3/uL (ref 0.0–0.5)
LYMPH%: 21.4 % (ref 14.0–49.0)
MCH: 31.8 pg (ref 27.2–33.4)
MCHC: 36.3 g/dL — ABNORMAL HIGH (ref 32.0–36.0)
MCV: 87.5 fL (ref 79.3–98.0)
MONO%: 8.1 % (ref 0.0–14.0)
Platelets: 128 10*3/uL — ABNORMAL LOW (ref 140–400)
RBC: 4.17 10*6/uL — ABNORMAL LOW (ref 4.20–5.82)

## 2013-05-17 LAB — COMPREHENSIVE METABOLIC PANEL (CC13)
ALT: 17 U/L (ref 0–55)
Albumin: 3.9 g/dL (ref 3.5–5.0)
Alkaline Phosphatase: 67 U/L (ref 40–150)
Potassium: 4.1 mEq/L (ref 3.5–5.1)
Sodium: 136 mEq/L (ref 136–145)
Total Bilirubin: 0.44 mg/dL (ref 0.20–1.20)
Total Protein: 7.1 g/dL (ref 6.4–8.3)

## 2013-05-17 MED ORDER — IOHEXOL 300 MG/ML  SOLN
80.0000 mL | Freq: Once | INTRAMUSCULAR | Status: AC | PRN
  Administered 2013-05-17: 80 mL via INTRAVENOUS

## 2013-05-19 ENCOUNTER — Ambulatory Visit (HOSPITAL_BASED_OUTPATIENT_CLINIC_OR_DEPARTMENT_OTHER): Payer: Medicare Other | Admitting: Internal Medicine

## 2013-05-19 ENCOUNTER — Encounter: Payer: Self-pay | Admitting: Internal Medicine

## 2013-05-19 ENCOUNTER — Telehealth: Payer: Self-pay | Admitting: Internal Medicine

## 2013-05-19 VITALS — BP 109/61 | HR 65 | Temp 97.3°F | Resp 20 | Ht 66.0 in | Wt 186.0 lb

## 2013-05-19 DIAGNOSIS — C3490 Malignant neoplasm of unspecified part of unspecified bronchus or lung: Secondary | ICD-10-CM

## 2013-05-19 DIAGNOSIS — C3492 Malignant neoplasm of unspecified part of left bronchus or lung: Secondary | ICD-10-CM

## 2013-05-19 NOTE — Telephone Encounter (Signed)
gv and printed appt sched and avs for pt  °

## 2013-05-19 NOTE — Progress Notes (Signed)
[facility] [phone]   [phone]  OFFICE PROGRESS NOTE  [name], MD [address] 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   PRIOR THERAPY:  1. Status post concurrent chemoradiation with weekly carboplatin and paclitaxel, last dose was given 04/08/2011.  2. Status post left lower lobectomy with mediastinal lymph node dissection under the care of Dr. Dorris Fetch on 06/11/2011 with no evidence for residual disease.  CURRENT THERAPY: Observation.  INTERVAL HISTORY: Rodney Christensen 72 y.o. male returns to the clinic today for followup visit accompanied his wife. The patient is feeling fine today with no specific complaints. He denied having any significant chest pain, shortness breath, cough or hemoptysis but has some chest congestion secondary to seasonal allergy. He denied having any significant weight loss or night sweats. He has repeat CT scan of the chest performed recently and he is here for evaluation and discussion of his scan results.  MEDICAL HISTORY: Past Medical History  Diagnosis Date  . CAD, NATIVE VESSEL 03/22/2010  . 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  . HYPERTENSION, UNSPECIFIED 03/17/2009  . HYPERLIPIDEMIA-MIXED 03/17/2009  . History of radiation therapy 03/18/11 to 04/19/11    lower left lung  . BPH (benign prostatic hyperplasia)   . Macular degeneration   . Diverticulosis   . Squamous cell carcinoma lung     Left lower lobe   . Skin cancer     melanoma-nose    ALLERGIES:  is allergic to cephalexin and latanoprost.  MEDICATIONS:  Current Outpatient Prescriptions  Medication Sig Dispense Refill  . amLODipine (NORVASC) 10 MG tablet Take 10 mg by mouth daily.      . Ascorbic Acid (VITAMIN C) 500 MG tablet Take 500 mg by mouth daily.       Marland Kitchen aspirin 81 MG tablet Take 81 mg by mouth daily.        . B  Complex Vitamins (B COMPLEX 100 PO) Take by mouth daily.        . beta carotene 56433 UNIT capsule Take 25,000 Units by mouth daily.        . Bilberry 500 MG CAPS Take 500 mg by mouth daily.       . bimatoprost (LUMIGAN) 0.03 % ophthalmic drops Place 1 drop into both eyes at bedtime.        . Calcium-Magnesium-Zinc (CAL-MAG-ZINC PO) Take by mouth as directed.        . Chelated Zinc 50 MG TABS Take by mouth daily.        . Chromium Picolinate 500 MCG TABS Take by mouth daily.        . Cinnamon 500 MG capsule Take 1,000 mg by mouth daily.       . Coenzyme Q10 (CO Q 10) 100 MG CAPS Take by mouth daily.        . dorzolamide (TRUSOPT) 2 % ophthalmic solution Place 1 drop into both eyes 2 (two) times daily as needed.      . dorzolamide-timolol (COSOPT) 22.3-6.8 MG/ML ophthalmic solution as directed.      . doxazosin (CARDURA) 8 MG tablet Take 8 mg by mouth at bedtime.        . fish oil-omega-3 fatty acids 1000 MG capsule Take 2 g by mouth 3 (three) times daily.        Marland Kitchen FLUVIRIN INJ       .  folic acid (FOLVITE) 1 MG tablet Take 1 mg by mouth daily.        . Garlic 1000 MG CAPS Take by mouth daily.        . Ginkgo Biloba (GINKOBA) 40 MG TABS Take by mouth daily.        Marland Kitchen lisinopril (PRINIVIL,ZESTRIL) 40 MG tablet Take 40 mg by mouth 2 (two) times daily.       . LUTEIN PO Take by mouth 2 (two) times daily.        . Melatonin 1 MG CAPS Take by mouth at bedtime.        . metoprolol (LOPRESSOR) 50 MG tablet Take 50 mg by mouth 2 (two) times daily.        . Multiple Vitamin (MULTIVITAMIN) tablet Take 1 tablet by mouth daily.        . potassium chloride SA (K-DUR,KLOR-CON) 20 MEQ tablet       . simvastatin (ZOCOR) 20 MG tablet Take 20 mg by mouth every evening.      . torsemide (DEMADEX) 20 MG tablet 20 mg daily.       . traMADol (ULTRAM) 50 MG tablet TAKE 1 OR 2 TABLETS BY MOUTH EVERY 4-6 HOURS AS NEEDED FOR PAIN  40 tablet  0   No current facility-administered medications for this visit.     SURGICAL HISTORY:  Past Surgical History  Procedure Laterality Date  . Coronary artery bypass graft  10/10/2004    x5  . Carotid endarterectomy    . Bronchoscopy  06/11/11    [facility]  . Lt vats,lt thoacotomy,lt lower lobectomy with  mediastinal node  dissection  06/11/11    [facility]    REVIEW OF SYSTEMS:  A comprehensive review of systems was negative.   PHYSICAL EXAMINATION: General appearance: alert, cooperative and no distress Head: Normocephalic, without obvious abnormality, atraumatic Neck: no adenopathy Lymph nodes: Cervical, supraclavicular, and axillary nodes normal. Resp: clear to auscultation bilaterally Cardio: regular rate and rhythm, S1, S2 normal, no murmur, click, rub or gallop GI: soft, non-tender; bowel sounds normal; no masses,  no organomegaly Extremities: extremities normal, atraumatic, no cyanosis or edema  ECOG PERFORMANCE STATUS: 1 - Symptomatic but completely ambulatory  Blood pressure 109/61, pulse 65, temperature 97.3 F (36.3 C), temperature source Oral, resp. rate 20, height 5\' 6"  (1.676 m), weight 186 lb (84.369 kg).  LABORATORY DATA: Lab Results  Component Value Date   WBC 6.4 05/17/2013   HGB 13.3 05/17/2013   HCT 36.5* 05/17/2013   MCV 87.5 05/17/2013   PLT 128* 05/17/2013      Chemistry      Component Value Date/Time   NA 136 05/17/2013 0754   NA 141 06/25/2012 0800   NA 139 09/09/2011 1037   K 4.1 05/17/2013 0754   K 3.6 06/25/2012 0800   K 3.7 09/09/2011 1037   CL 102 05/17/2013 0754   CL 100 06/25/2012 0800   CL 101 09/09/2011 1037   CO2 25 05/17/2013 0754   CO2 27 06/25/2012 0800   CO2 27 09/09/2011 1037   BUN 12.5 05/17/2013 0754   BUN 11 06/25/2012 0800   BUN 10 09/09/2011 1037   CREATININE 1.0 05/17/2013 0754   CREATININE 0.8 06/25/2012 0800   CREATININE 0.86 09/09/2011 1037      Component Value Date/Time   CALCIUM 9.2 05/17/2013 0754   CALCIUM 9.4 06/25/2012 0800   CALCIUM 9.0 09/09/2011 1037   ALKPHOS 67 05/17/2013 0754    ALKPHOS 61 06/25/2012  0800   ALKPHOS 73 09/09/2011 1037   AST 16 05/17/2013 0754   AST 24 06/25/2012 0800   AST 16 09/09/2011 1037   ALT 17 05/17/2013 0754   ALT 15 09/09/2011 1037   BILITOT 0.44 05/17/2013 0754   BILITOT 1.00 06/25/2012 0800   BILITOT 0.4 09/09/2011 1037       RADIOGRAPHIC STUDIES: Ct Chest W Contrast  05/17/2013   *RADIOLOGY REPORT*  Clinical Data: Follow-up lung carcinoma. Cough.  Completed chemotherapy and radiation therapy.  CT CHEST WITH CONTRAST  Technique:  Multidetector CT imaging of the chest was performed following the standard protocol during bolus administration of intravenous contrast.  Contrast: 80mL OMNIPAQUE IOHEXOL 300 MG/ML  SOLN  Comparison: 11/11/2012  Findings: Postsurgical changes in the left hemithorax are stable. Left lower lung bronchiectasis and pleural thickening is stable and consistent with chronic postradiation changes.  No evidence of central endobronchial obstruction.  No evidence of hilar or mediastinal lymphadenopathy.  No adenopathy seen elsewhere within the thorax.  No suspicious pulmonary nodules or masses are identified, and there is no evidence of acute infiltrate.  Small hiatal hernia again noted.  Prior CABG.  No evidence of chest wall mass or suspicious bone lesions.  The adrenal glands are normal in appearance. Cholelithiasis again noted.  IMPRESSION:  1.  Stable post-treatment changes in the left hemithorax. No evidence of recurrent or metastatic carcinoma. 2.  Stable small hiatal hernia and cholelithiasis.   Original Report Authenticated By: [name], M.D.    ASSESSMENT AND PLAN: This is a very pleasant 72 years old white male with history of stage IIb non-small cell lung cancer status post concurrent chemoradiation followed by left lower lobectomy and mediastinal lymph node dissection was no evidence for residual disease. The patient has been observation since July of 2012 with no evidence for disease recurrence. I discussed the scan results  with the patient and his wife. I recommended for him to continue on observation with repeat CT scan of the chest in 6 months.  He would come back for followup visit at that time.  He was advised to call immediately she has any concerning symptoms in the interval.  All questions were answered. The patient knows to call the clinic with any problems, questions or concerns. We can certainly see the patient much sooner if necessary.

## 2013-05-19 NOTE — Patient Instructions (Signed)
No evidence for disease recurrence on the recent scan. Followup visit in 6 months. 

## 2013-06-15 ENCOUNTER — Ambulatory Visit (INDEPENDENT_AMBULATORY_CARE_PROVIDER_SITE_OTHER): Payer: Medicare Other | Admitting: Thoracic Surgery (Cardiothoracic Vascular Surgery)

## 2013-06-15 ENCOUNTER — Encounter: Payer: Self-pay | Admitting: Thoracic Surgery (Cardiothoracic Vascular Surgery)

## 2013-06-15 VITALS — BP 119/70 | HR 59 | Resp 20 | Ht 66.0 in | Wt 186.0 lb

## 2013-06-15 DIAGNOSIS — Z85118 Personal history of other malignant neoplasm of bronchus and lung: Secondary | ICD-10-CM

## 2013-06-15 NOTE — Progress Notes (Signed)
HPI:  Rodney Christensen is a 72 year old former smoker who had a stage IIb (T3, N0) non-small cell lung cancer treated with neoadjuvant chemoradiation followed by lobectomy in July of 2012. He now returns for his 2 year followup visit. He recently saw Dr. Arbutus Ped in June.  He says he's been feeling well. He still gets short of breath when he walks up an incline. This is been stable. He's not had any anginal-type pain. His weight has been stable. He has not had any hemoptysis. Overall he feels well.  He has wife are going to celebrate the 50th wedding anniversary this August.  Past Medical History  Diagnosis Date  . CAD, NATIVE VESSEL 03/22/2010  . 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  . HYPERTENSION, UNSPECIFIED 03/17/2009  . HYPERLIPIDEMIA-MIXED 03/17/2009  . History of radiation therapy 03/18/11 to 04/19/11    lower left lung  . BPH (benign prostatic hyperplasia)   . Macular degeneration   . Diverticulosis   . Squamous cell carcinoma lung     Left lower lobe   . Skin cancer     melanoma-nose     Current Outpatient Prescriptions  Medication Sig Dispense Refill  . amLODipine (NORVASC) 10 MG tablet Take 10 mg by mouth daily.      . Ascorbic Acid (VITAMIN C) 500 MG tablet Take 500 mg by mouth daily.       Marland Kitchen aspirin 81 MG tablet Take 81 mg by mouth daily.        . B Complex Vitamins (B COMPLEX 100 PO) Take by mouth daily.        . beta carotene 16109 UNIT capsule Take 25,000 Units by mouth daily.        . Bilberry 500 MG CAPS Take 500 mg by mouth daily.       . bimatoprost (LUMIGAN) 0.03 % ophthalmic drops Place 1 drop into both eyes at bedtime.        . Calcium-Magnesium-Zinc (CAL-MAG-ZINC PO) Take by mouth as directed.        . Chelated Zinc 50 MG TABS Take by mouth daily.        . Chromium Picolinate 500 MCG TABS Take by mouth daily.        . Cinnamon 500 MG capsule Take 1,000 mg by mouth daily.       . Coenzyme Q10 (CO Q 10) 100 MG CAPS Take by mouth daily.        . dorzolamide (TRUSOPT) 2 %  ophthalmic solution Place 1 drop into both eyes 2 (two) times daily as needed.      . dorzolamide-timolol (COSOPT) 22.3-6.8 MG/ML ophthalmic solution as directed.      . doxazosin (CARDURA) 8 MG tablet Take 8 mg by mouth at bedtime.        . fish oil-omega-3 fatty acids 1000 MG capsule Take 2 g by mouth 3 (three) times daily.        Marland Kitchen FLUVIRIN INJ       . folic acid (FOLVITE) 1 MG tablet Take 1 mg by mouth daily.        . Garlic 1000 MG CAPS Take by mouth daily.        . Ginkgo Biloba (GINKOBA) 40 MG TABS Take by mouth daily.        Marland Kitchen lisinopril (PRINIVIL,ZESTRIL) 40 MG tablet Take 40 mg by mouth 2 (two) times daily.       . LUTEIN PO Take by mouth 2 (two) times daily.        Marland Kitchen  Melatonin 1 MG CAPS Take by mouth at bedtime.        . metoprolol (LOPRESSOR) 50 MG tablet Take 50 mg by mouth 2 (two) times daily.        . Multiple Vitamin (MULTIVITAMIN) tablet Take 1 tablet by mouth daily.        . potassium chloride SA (K-DUR,KLOR-CON) 20 MEQ tablet       . simvastatin (ZOCOR) 20 MG tablet Take 20 mg by mouth every evening.      . torsemide (DEMADEX) 20 MG tablet 20 mg daily.       . traMADol (ULTRAM) 50 MG tablet TAKE 1 OR 2 TABLETS BY MOUTH EVERY 4-6 HOURS AS NEEDED FOR PAIN  40 tablet  0   No current facility-administered medications for this visit.    Physical Exam BP 119/70  Pulse 59  Resp 20  Ht 5\' 6"  (1.676 m)  Wt 186 lb (84.369 kg)  BMI 30.04 kg/m2  SpO45 45% 72 year old male in no acute distress General well-developed well-nourished Neurologic alert and oriented x3, no focal deficits Cardiac regular rate and rhythm normal S1 and S2 No cervical or supraclavicular adenopathy Lungs slightly diminished at left base otherwise clear  Diagnostic Tests: CT of chest 05/17/2013 *RADIOLOGY REPORT*  Clinical Data: Follow-up lung carcinoma. Cough. Completed  chemotherapy and radiation therapy.  CT CHEST WITH CONTRAST  Technique: Multidetector CT imaging of the chest was performed   following the standard protocol during bolus administration of  intravenous contrast.  Contrast: 80mL OMNIPAQUE IOHEXOL 300 MG/ML SOLN  Comparison: 11/11/2012  Findings: Postsurgical changes in the left hemithorax are stable.  Left lower lung bronchiectasis and pleural thickening is stable and  consistent with chronic postradiation changes. No evidence of  central endobronchial obstruction. No evidence of hilar or  mediastinal lymphadenopathy. No adenopathy seen elsewhere within  the thorax.  No suspicious pulmonary nodules or masses are identified, and there  is no evidence of acute infiltrate. Small hiatal hernia again  noted. Prior CABG. No evidence of chest wall mass or suspicious  bone lesions. The adrenal glands are normal in appearance.  Cholelithiasis again noted.  IMPRESSION:  1. Stable post-treatment changes in the left hemithorax. No  evidence of recurrent or metastatic carcinoma.  2. Stable small hiatal hernia and cholelithiasis.  Original Report Authenticated By: [name], M.D.   Impression: 72 year old gentleman now 2 years out from combined modality treatment for a stage IIb non-small cell carcinoma. He has no evidence recurrent disease. His respiratory function is stable.   CT does show stable radiation changes in the base of the left upper lobe.  He's also doing well from a cardiac standpoint with no recurrent angina.   Plan: [name] will see Dr. Arbutus Ped in January for 6 month followup.  To avoid duplication services at this point I will plan to see him back in one year for his 3 year followup visit with CT of the chest at that time.

## 2013-06-16 ENCOUNTER — Ambulatory Visit (INDEPENDENT_AMBULATORY_CARE_PROVIDER_SITE_OTHER): Payer: Medicare Other | Admitting: Cardiology

## 2013-06-16 ENCOUNTER — Encounter: Payer: Self-pay | Admitting: Cardiology

## 2013-06-16 DIAGNOSIS — I6529 Occlusion and stenosis of unspecified carotid artery: Secondary | ICD-10-CM

## 2013-06-16 DIAGNOSIS — I10 Essential (primary) hypertension: Secondary | ICD-10-CM

## 2013-06-16 DIAGNOSIS — E785 Hyperlipidemia, unspecified: Secondary | ICD-10-CM

## 2013-06-16 DIAGNOSIS — Z9889 Other specified postprocedural states: Secondary | ICD-10-CM

## 2013-06-16 DIAGNOSIS — I2510 Atherosclerotic heart disease of native coronary artery without angina pectoris: Secondary | ICD-10-CM

## 2013-06-16 NOTE — Patient Instructions (Addendum)
Your physician recommends that you continue on your current medications as directed. Please refer to the Current Medication list given to you today.  Your physician wants you to follow-up in: 1 year with Dr. Delton See.  You will receive a reminder letter in the mail two months in advance. If you don't receive a letter, please call our office to schedule the follow-up appointment.   Your physician has requested that you have a carotid duplex.  This test is an ultrasound of the carotid arteries in your neck. It looks at blood flow through these arteries that supply the brain with blood. Allow one hour for this exam. There are no restrictions or special instructions.  You will need fasting cholesterol lab work with your primary care

## 2013-06-16 NOTE — Progress Notes (Signed)
HPI Rodney Christensen returns today for evaluation and management of his history of coronary artery disease, previous coronary bypass grafting, carotid artery disease with previous left carotid endarterectomy, and multiple risk factors for vascular disease.  He offers no complaints today including no angina or chest pain. He is fairly limited in mobility.  He is compliant with his medications his wife reinforces. His last carotid Dopplers were stable a year ago. They need to be redone this year.  Past Medical History  Diagnosis Date  . CAD, NATIVE VESSEL 03/22/2010  . 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  . HYPERTENSION, UNSPECIFIED 03/17/2009  . HYPERLIPIDEMIA-MIXED 03/17/2009  . History of radiation therapy 03/18/11 to 04/19/11    lower left lung  . BPH (benign prostatic hyperplasia)   . Macular degeneration   . Diverticulosis   . Squamous cell carcinoma lung     Left lower lobe   . Skin cancer     melanoma-nose    Current Outpatient Prescriptions  Medication Sig Dispense Refill  . amLODipine (NORVASC) 10 MG tablet Take 10 mg by mouth daily.      . Ascorbic Acid (VITAMIN C) 500 MG tablet Take 500 mg by mouth daily.       Marland Kitchen aspirin 81 MG tablet Take 81 mg by mouth daily.        . B Complex Vitamins (B COMPLEX 100 PO) Take by mouth daily.        . beta carotene 64403 UNIT capsule Take 25,000 Units by mouth daily.        . Bilberry 500 MG CAPS Take 500 mg by mouth daily.       . bimatoprost (LUMIGAN) 0.03 % ophthalmic drops Place 1 drop into both eyes at bedtime.        . Calcium-Magnesium-Zinc (CAL-MAG-ZINC PO) Take by mouth as directed.        . Chelated Zinc 50 MG TABS Take by mouth daily.        . Chromium Picolinate 500 MCG TABS Take by mouth daily.        . Cinnamon 500 MG capsule Take 1,000 mg by mouth daily.       . Coenzyme Q10 (CO Q 10) 100 MG CAPS Take by mouth daily.        . dorzolamide (TRUSOPT) 2 % ophthalmic solution Place 1 drop into both eyes 2 (two) times daily as needed.      .  doxazosin (CARDURA) 8 MG tablet Take 8 mg by mouth at bedtime.        . fish oil-omega-3 fatty acids 1000 MG capsule Take 2 g by mouth 3 (three) times daily.        . folic acid (FOLVITE) 1 MG tablet Take 1 mg by mouth daily.        . Garlic 1000 MG CAPS Take by mouth daily.        . Ginkgo Biloba (GINKOBA) 40 MG TABS Take by mouth daily.        Marland Kitchen lisinopril (PRINIVIL,ZESTRIL) 40 MG tablet Take 40 mg by mouth 2 (two) times daily.       . LUTEIN PO Take by mouth 2 (two) times daily.        . Melatonin 1 MG CAPS Take by mouth at bedtime.        . metoprolol (LOPRESSOR) 50 MG tablet Take 50 mg by mouth 2 (two) times daily.        . Multiple Vitamin (MULTIVITAMIN) tablet Take 1 tablet  by mouth daily.        . potassium chloride SA (K-DUR,KLOR-CON) 20 MEQ tablet       . simvastatin (ZOCOR) 20 MG tablet Take 20 mg by mouth every evening.      . torsemide (DEMADEX) 20 MG tablet 20 mg daily.       . traMADol (ULTRAM) 50 MG tablet TAKE 1 OR 2 TABLETS BY MOUTH EVERY 4-6 HOURS AS NEEDED FOR PAIN  40 tablet  0   No current facility-administered medications for this visit.    Allergies  Allergen Reactions  . Cephalexin     REACTION: Hives  . Latanoprost     REACTION: Reaction not known.    Family History  Problem Relation Age of Onset  . Stroke Mother     History   Social History  . Marital Status: Married    Spouse Name: N/A    Number of Children: N/A  . Years of Education: N/A   Occupational History  . Not on file.   Social History Main Topics  . Smoking status: Former Smoker -- 2.50 packs/day for 25 years    Quit date: 09/14/1981  . Smokeless tobacco: Not on file  . Alcohol Use: Yes     Comment: occassional  . Drug Use: No  . Sexually Active: Not on file   Other Topics Concern  . Not on file   Social History Narrative  . No narrative on file    ROS ALL NEGATIVE EXCEPT THOSE NOTED IN HPI  PE  General Appearance: well developed, well nourished in no acute distress,  obese HEENT: symmetrical face, PERRLA, good dentition  Neck: no JVD, thyromegaly, or adenopathy, trachea midline Chest: symmetric without deformity Cardiac: PMI non-displaced, RRR, normal S1, S2, no gallop or murmur Lung: clear to ausculation and percussion Vascular: Bilateral soft carotid bruits, left carotid endarterectomy scar stable, distal pulses reduced in the lower extremities with diminished capillary refill  Abdominal: nondistended, nontender, good bowel sounds, no HSM, no bruits Extremities: no cyanosis, clubbing or edema, no sign of DVT, no varicosities  Skin: normal color, no rashes Neuro: alert and oriented x 3, non-focal Pysch: normal affect  EKG Sinus bradycardia with PACs, poor R-wave progression anterior precordium. BMET    Component Value Date/Time   NA 136 05/17/2013 0754   NA 141 06/25/2012 0800   NA 139 09/09/2011 1037   K 4.1 05/17/2013 0754   K 3.6 06/25/2012 0800   K 3.7 09/09/2011 1037   CL 102 05/17/2013 0754   CL 100 06/25/2012 0800   CL 101 09/09/2011 1037   CO2 25 05/17/2013 0754   CO2 27 06/25/2012 0800   CO2 27 09/09/2011 1037   GLUCOSE 103* 05/17/2013 0754   GLUCOSE 106 06/25/2012 0800   GLUCOSE 134* 09/09/2011 1037   BUN 12.5 05/17/2013 0754   BUN 11 06/25/2012 0800   BUN 10 09/09/2011 1037   CREATININE 1.0 05/17/2013 0754   CREATININE 0.8 06/25/2012 0800   CREATININE 0.86 09/09/2011 1037   CALCIUM 9.2 05/17/2013 0754   CALCIUM 9.4 06/25/2012 0800   CALCIUM 9.0 09/09/2011 1037   GFRNONAA >60 06/15/2011 0600   GFRAA >60 06/15/2011 0600    Lipid Panel  No results found for this basename: chol, trig, hdl, cholhdl, vldl, ldlcalc    CBC    Component Value Date/Time   WBC 6.4 05/17/2013 0754   WBC 9.0 06/15/2011 0600   RBC 4.17* 05/17/2013 0754   RBC 3.54* 06/15/2011 0600  HGB 13.3 05/17/2013 0754   HGB 11.4* 06/15/2011 0600   HCT 36.5* 05/17/2013 0754   HCT 32.1* 06/15/2011 0600   PLT 128* 05/17/2013 0754   PLT 112* 06/15/2011 0600   MCV 87.5 05/17/2013 0754    MCV 90.7 06/15/2011 0600   MCH 31.8 05/17/2013 0754   MCH 32.2 06/15/2011 0600   MCHC 36.3* 05/17/2013 0754   MCHC 35.5 06/15/2011 0600   RDW 12.7 05/17/2013 0754   RDW 14.2 06/15/2011 0600   LYMPHSABS 1.4 05/17/2013 0754   MONOABS 0.5 05/17/2013 0754   EOSABS 0.3 05/17/2013 0754   BASOSABS 0.1 05/17/2013 0754

## 2013-06-16 NOTE — Assessment & Plan Note (Signed)
We'll schedule carotid Dopplers. Continue secondary preventative therapy.

## 2013-06-16 NOTE — Assessment & Plan Note (Signed)
Stable. Continue secondary preventative therapy. Return the office in one year. 

## 2013-06-17 ENCOUNTER — Encounter (INDEPENDENT_AMBULATORY_CARE_PROVIDER_SITE_OTHER): Payer: Medicare Other

## 2013-06-17 DIAGNOSIS — I6529 Occlusion and stenosis of unspecified carotid artery: Secondary | ICD-10-CM

## 2013-12-13 ENCOUNTER — Encounter (HOSPITAL_COMMUNITY): Payer: Self-pay

## 2013-12-13 ENCOUNTER — Ambulatory Visit (HOSPITAL_COMMUNITY)
Admission: RE | Admit: 2013-12-13 | Discharge: 2013-12-13 | Disposition: A | Discharge status: Home / Self Care | Payer: Medicare Other | Source: Ambulatory Visit | Attending: Internal Medicine | Admitting: Internal Medicine

## 2013-12-13 ENCOUNTER — Other Ambulatory Visit: Payer: Medicare Other

## 2013-12-13 DIAGNOSIS — R091 Pleurisy: Secondary | ICD-10-CM | POA: Insufficient documentation

## 2013-12-13 DIAGNOSIS — C3492 Malignant neoplasm of unspecified part of left bronchus or lung: Secondary | ICD-10-CM

## 2013-12-13 DIAGNOSIS — K449 Diaphragmatic hernia without obstruction or gangrene: Secondary | ICD-10-CM | POA: Insufficient documentation

## 2013-12-13 DIAGNOSIS — K8020 Calculus of gallbladder without cholecystitis without obstruction: Secondary | ICD-10-CM | POA: Insufficient documentation

## 2013-12-13 DIAGNOSIS — I700 Atherosclerosis of aorta: Secondary | ICD-10-CM | POA: Insufficient documentation

## 2013-12-13 DIAGNOSIS — Z923 Personal history of irradiation: Secondary | ICD-10-CM | POA: Insufficient documentation

## 2013-12-13 DIAGNOSIS — Z9221 Personal history of antineoplastic chemotherapy: Secondary | ICD-10-CM | POA: Insufficient documentation

## 2013-12-13 DIAGNOSIS — C3490 Malignant neoplasm of unspecified part of unspecified bronchus or lung: Secondary | ICD-10-CM | POA: Insufficient documentation

## 2013-12-13 DIAGNOSIS — I517 Cardiomegaly: Secondary | ICD-10-CM | POA: Insufficient documentation

## 2013-12-13 DIAGNOSIS — J479 Bronchiectasis, uncomplicated: Secondary | ICD-10-CM | POA: Insufficient documentation

## 2013-12-13 DIAGNOSIS — Z951 Presence of aortocoronary bypass graft: Secondary | ICD-10-CM | POA: Insufficient documentation

## 2013-12-13 LAB — COMPREHENSIVE METABOLIC PANEL (CC13)
ALBUMIN: 4 g/dL (ref 3.5–5.0)
ALK PHOS: 58 U/L (ref 40–150)
ALT: 24 U/L (ref 0–55)
AST: 17 U/L (ref 5–34)
Anion Gap: 10 mEq/L (ref 3–11)
BUN: 13.4 mg/dL (ref 7.0–26.0)
CALCIUM: 9.4 mg/dL (ref 8.4–10.4)
CO2: 26 mEq/L (ref 22–29)
Chloride: 106 mEq/L (ref 98–109)
Creatinine: 1 mg/dL (ref 0.7–1.3)
Glucose: 113 mg/dl (ref 70–140)
POTASSIUM: 4.1 meq/L (ref 3.5–5.1)
Sodium: 141 mEq/L (ref 136–145)
Total Bilirubin: 0.55 mg/dL (ref 0.20–1.20)
Total Protein: 7.1 g/dL (ref 6.4–8.3)

## 2013-12-13 LAB — CBC WITH DIFFERENTIAL/PLATELET
BASO%: 0.6 % (ref 0.0–2.0)
BASOS ABS: 0 10*3/uL (ref 0.0–0.1)
EOS%: 4.1 % (ref 0.0–7.0)
Eosinophils Absolute: 0.2 10*3/uL (ref 0.0–0.5)
HCT: 39.5 % (ref 38.4–49.9)
HEMOGLOBIN: 13.4 g/dL (ref 13.0–17.1)
LYMPH%: 23.8 % (ref 14.0–49.0)
MCH: 31.3 pg (ref 27.2–33.4)
MCHC: 34 g/dL (ref 32.0–36.0)
MCV: 92.2 fL (ref 79.3–98.0)
MONO#: 0.5 10*3/uL (ref 0.1–0.9)
MONO%: 9.4 % (ref 0.0–14.0)
NEUT#: 3.6 10*3/uL (ref 1.5–6.5)
NEUT%: 62.1 % (ref 39.0–75.0)
Platelets: 124 10*3/uL — ABNORMAL LOW (ref 140–400)
RBC: 4.29 10*6/uL (ref 4.20–5.82)
RDW: 12.6 % (ref 11.0–14.6)
WBC: 5.8 10*3/uL (ref 4.0–10.3)
lymph#: 1.4 10*3/uL (ref 0.9–3.3)

## 2013-12-13 MED ORDER — IOHEXOL 300 MG/ML  SOLN
80.0000 mL | Freq: Once | INTRAMUSCULAR | Status: AC | PRN
  Administered 2013-12-13: 80 mL via INTRAVENOUS

## 2013-12-15 ENCOUNTER — Ambulatory Visit (HOSPITAL_BASED_OUTPATIENT_CLINIC_OR_DEPARTMENT_OTHER): Payer: Medicare Other | Admitting: Internal Medicine

## 2013-12-15 ENCOUNTER — Encounter: Payer: Self-pay | Admitting: Internal Medicine

## 2013-12-15 ENCOUNTER — Telehealth: Payer: Self-pay | Admitting: Internal Medicine

## 2013-12-15 VITALS — BP 119/58 | HR 61 | Temp 98.1°F | Resp 18 | Ht 67.0 in | Wt 196.9 lb

## 2013-12-15 DIAGNOSIS — I10 Essential (primary) hypertension: Secondary | ICD-10-CM

## 2013-12-15 DIAGNOSIS — C3490 Malignant neoplasm of unspecified part of unspecified bronchus or lung: Secondary | ICD-10-CM

## 2013-12-15 DIAGNOSIS — C3430 Malignant neoplasm of lower lobe, unspecified bronchus or lung: Secondary | ICD-10-CM

## 2013-12-15 NOTE — Telephone Encounter (Signed)
gv adn printed appt sched and avs for pt for July  °

## 2013-12-15 NOTE — Patient Instructions (Signed)
Followup visit in 6 months with repeat CT scan of the chest.

## 2013-12-15 NOTE — Progress Notes (Signed)
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   PRIOR THERAPY:  1. Status post concurrent chemoradiation with weekly carboplatin and paclitaxel, last dose was given 04/08/2011.  2. Status post left lower lobectomy with mediastinal lymph node dissection under the care of Dr. Roxan Hockey on 06/11/2011 with no evidence for residual disease.  CURRENT THERAPY: Observation.  INTERVAL HISTORY: Rodney Christensen 73 y.o. male returns to the clinic today for followup visit accompanied by his wife. The patient is feeling fine today with no specific complaints. He denied having any significant chest pain, shortness of breath, cough or hemoptysis. The patient and his wife celebrated their 50th anniversary few weeks ago. He denied having any significant weight loss or night sweats. He has repeat CT scan of the chest performed recently and he is here for evaluation and discussion of his scan results.  MEDICAL HISTORY: Past Medical History  Diagnosis Date  . CAD, NATIVE VESSEL 03/22/2010  . 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  . HYPERTENSION, UNSPECIFIED 03/17/2009  . HYPERLIPIDEMIA-MIXED 03/17/2009  . History of radiation therapy 03/18/11 to 04/19/11    lower left lung  . BPH (benign prostatic hyperplasia)   . Macular degeneration   . Diverticulosis   . Squamous cell carcinoma lung     Left lower lobe   . Skin cancer     melanoma-nose    ALLERGIES:  is allergic to cephalexin and latanoprost.  MEDICATIONS:  Current Outpatient Prescriptions  Medication Sig Dispense Refill  . amLODipine (NORVASC) 10 MG tablet Take 10 mg by mouth daily.      . Ascorbic Acid (VITAMIN C) 500 MG tablet Take 500 mg by mouth daily.       Marland Kitchen aspirin 81 MG tablet Take 81 mg by mouth  daily.        . B Complex Vitamins (B COMPLEX 100 PO) Take by mouth daily.        . beta carotene 25000 UNIT capsule Take 25,000 Units by mouth daily.        . Bilberry 500 MG CAPS Take 500 mg by mouth daily.       . bimatoprost (LUMIGAN) 0.03 % ophthalmic drops Place 1 drop into both eyes at bedtime.        . Calcium-Magnesium-Zinc (CAL-MAG-ZINC PO) Take by mouth as directed.        . Chelated Zinc 50 MG TABS Take by mouth daily.        . Chromium Picolinate 500 MCG TABS Take by mouth daily.        . Cinnamon 500 MG capsule Take 1,000 mg by mouth daily.       . Coenzyme Q10 (CO Q 10) 100 MG CAPS Take by mouth daily.        . dorzolamide (TRUSOPT) 2 % ophthalmic solution Place 1 drop into both eyes 2 (two) times daily as needed.      . doxazosin (CARDURA) 8 MG tablet Take 8 mg by mouth at bedtime.        . fish oil-omega-3 fatty acids 1000 MG capsule Take 2 g by mouth 3 (three) times daily.        . folic acid (FOLVITE) 1 MG tablet Take 1 mg by mouth daily.        Marland Kitchen  Garlic 8841 MG CAPS Take by mouth daily.        . Ginkgo Biloba (GINKOBA) 40 MG TABS Take by mouth daily.        Marland Kitchen lisinopril (PRINIVIL,ZESTRIL) 40 MG tablet Take 40 mg by mouth 2 (two) times daily.       . LUTEIN PO Take by mouth 2 (two) times daily.        . Melatonin 1 MG CAPS Take by mouth at bedtime.        . metoprolol (LOPRESSOR) 50 MG tablet Take 50 mg by mouth 2 (two) times daily.        . Multiple Vitamin (MULTIVITAMIN) tablet Take 1 tablet by mouth daily.        . potassium chloride SA (K-DUR,KLOR-CON) 20 MEQ tablet       . simvastatin (ZOCOR) 20 MG tablet Take 20 mg by mouth every evening.      . torsemide (DEMADEX) 20 MG tablet 20 mg daily.       . traMADol (ULTRAM) 50 MG tablet TAKE 1 OR 2 TABLETS BY MOUTH EVERY 4-6 HOURS AS NEEDED FOR PAIN  40 tablet  0   No current facility-administered medications for this visit.    SURGICAL HISTORY:  Past Surgical History  Procedure Laterality Date  . Coronary artery  bypass graft  10/10/2004    x5  . Carotid endarterectomy    . Bronchoscopy  06/11/11    [facility]  . Lt vats,lt thoacotomy,lt lower lobectomy with  mediastinal node  dissection  06/11/11    [facility]    REVIEW OF SYSTEMS:  A comprehensive review of systems was negative.   PHYSICAL EXAMINATION: General appearance: alert, cooperative and no distress Head: Normocephalic, without obvious abnormality, atraumatic Neck: no adenopathy Lymph nodes: Cervical, supraclavicular, and axillary nodes normal. Resp: clear to auscultation bilaterally Cardio: regular rate and rhythm, S1, S2 normal, no murmur, click, rub or gallop GI: soft, non-tender; bowel sounds normal; no masses,  no organomegaly Extremities: extremities normal, atraumatic, no cyanosis or edema  ECOG PERFORMANCE STATUS: 1 - Symptomatic but completely ambulatory  Blood pressure 119/58, pulse 61, temperature 98.1 F (36.7 C), temperature source Oral, resp. rate 18, height 5\' 7"  (1.702 m), weight 196 lb 14.4 oz (89.313 kg).  LABORATORY DATA: Lab Results  Component Value Date   WBC 5.8 12/13/2013   HGB 13.4 12/13/2013   HCT 39.5 12/13/2013   MCV 92.2 12/13/2013   PLT 124* 12/13/2013      Chemistry      Component Value Date/Time   NA 141 12/13/2013 0801   NA 141 06/25/2012 0800   NA 139 09/09/2011 1037   K 4.1 12/13/2013 0801   K 3.6 06/25/2012 0800   K 3.7 09/09/2011 1037   CL 102 05/17/2013 0754   CL 100 06/25/2012 0800   CL 101 09/09/2011 1037   CO2 26 12/13/2013 0801   CO2 27 06/25/2012 0800   CO2 27 09/09/2011 1037   BUN 13.4 12/13/2013 0801   BUN 11 06/25/2012 0800   BUN 10 09/09/2011 1037   CREATININE 1.0 12/13/2013 0801   CREATININE 0.8 06/25/2012 0800   CREATININE 0.86 09/09/2011 1037      Component Value Date/Time   CALCIUM 9.4 12/13/2013 0801   CALCIUM 9.4 06/25/2012 0800   CALCIUM 9.0 09/09/2011 1037   ALKPHOS 58 12/13/2013 0801   ALKPHOS 61 06/25/2012 0800   ALKPHOS 73 09/09/2011 1037   AST 17 12/13/2013 0801   AST 24  06/25/2012  0800   AST 16 09/09/2011 1037   ALT 24 12/13/2013 0801   ALT 37 06/25/2012 0800   ALT 15 09/09/2011 1037   BILITOT 0.55 12/13/2013 0801   BILITOT 1.00 06/25/2012 0800   BILITOT 0.4 09/09/2011 1037       RADIOGRAPHIC STUDIES:  Ct Chest W Contrast  12/13/2013   CLINICAL DATA:  History squamous cell carcinoma, patient is status post left lower lobe lobectomy, chemo and radiation therapy.  EXAM: CT CHEST WITH CONTRAST  TECHNIQUE: Multidetector CT imaging of the chest was performed during intravenous contrast administration.  CONTRAST:  33mL OMNIPAQUE IOHEXOL 300 MG/ML  SOLN  COMPARISON:  05/17/2013, 06/25/2012  FINDINGS: The thoracic inlet is unremarkable. Multichamber cardiac enlargement is appreciated. The patient is status post median sternotomy and coronary artery bypass grafting. These findings appear stable.  Postsurgical changes once again identified within the left lung base. The areas of left lower lobe bronchiectasis and pleural thickening are stable. The mediastinum and hilar regions are stable. There has not been interval development of adenopathy nor an appreciable mass.  The remaining lung parenchyma, is stable. No new pulmonary nodules nor masses are identified.  The visualized upper abdominal viscera demonstrate a calcified gallstone within the dependent portion the gallbladder no otherwise stable.  Atherosclerotic calcifications identified within the thoracic and abdominal aorta.  Stable small hiatal hernia.  IMPRESSION: 1. Stable postsurgical and postradiation changes within the left lung base. There is no evidence of recurrent or metastatic disease. 2. Stable cholelithiasis and small hiatal hernia.   Electronically Signed   By: Margaree Mackintosh M.D.   On: 12/13/2013 09:46    ASSESSMENT AND PLAN: This is a very pleasant 73 years old white male with history of stage IIb non-small cell lung cancer status post concurrent chemoradiation followed by left lower lobectomy and mediastinal  lymph node dissection with no evidence for residual disease. The patient has been observation since July of 2012 with no evidence for disease recurrence. I discussed the scan results with the patient and his wife. I recommended for him to continue on observation with repeat CT scan of the chest in 6 months.  He would come back for followup visit at that time.  He was advised to call immediately she has any concerning symptoms in the interval.  All questions were answered. The patient knows to call the clinic with any problems, questions or concerns. We can certainly see the patient much sooner if necessary.

## 2014-05-19 ENCOUNTER — Other Ambulatory Visit: Payer: Self-pay

## 2014-05-19 DIAGNOSIS — C3490 Malignant neoplasm of unspecified part of unspecified bronchus or lung: Secondary | ICD-10-CM

## 2014-06-13 ENCOUNTER — Ambulatory Visit (HOSPITAL_COMMUNITY)
Admission: RE | Admit: 2014-06-13 | Discharge: 2014-06-13 | Disposition: A | Discharge status: Home / Self Care | Payer: Medicare Other | Source: Ambulatory Visit | Attending: Internal Medicine | Admitting: Internal Medicine

## 2014-06-13 ENCOUNTER — Other Ambulatory Visit (HOSPITAL_BASED_OUTPATIENT_CLINIC_OR_DEPARTMENT_OTHER): Payer: Medicare Other

## 2014-06-13 DIAGNOSIS — I2510 Atherosclerotic heart disease of native coronary artery without angina pectoris: Secondary | ICD-10-CM | POA: Insufficient documentation

## 2014-06-13 DIAGNOSIS — M47814 Spondylosis without myelopathy or radiculopathy, thoracic region: Secondary | ICD-10-CM | POA: Insufficient documentation

## 2014-06-13 DIAGNOSIS — Z9221 Personal history of antineoplastic chemotherapy: Secondary | ICD-10-CM | POA: Insufficient documentation

## 2014-06-13 DIAGNOSIS — C3490 Malignant neoplasm of unspecified part of unspecified bronchus or lung: Secondary | ICD-10-CM | POA: Insufficient documentation

## 2014-06-13 DIAGNOSIS — M47817 Spondylosis without myelopathy or radiculopathy, lumbosacral region: Secondary | ICD-10-CM | POA: Insufficient documentation

## 2014-06-13 DIAGNOSIS — I700 Atherosclerosis of aorta: Secondary | ICD-10-CM | POA: Insufficient documentation

## 2014-06-13 DIAGNOSIS — Z923 Personal history of irradiation: Secondary | ICD-10-CM | POA: Insufficient documentation

## 2014-06-13 DIAGNOSIS — Z951 Presence of aortocoronary bypass graft: Secondary | ICD-10-CM | POA: Insufficient documentation

## 2014-06-13 DIAGNOSIS — J438 Other emphysema: Secondary | ICD-10-CM | POA: Insufficient documentation

## 2014-06-13 DIAGNOSIS — Z902 Acquired absence of lung [part of]: Secondary | ICD-10-CM | POA: Insufficient documentation

## 2014-06-13 DIAGNOSIS — K8020 Calculus of gallbladder without cholecystitis without obstruction: Secondary | ICD-10-CM | POA: Insufficient documentation

## 2014-06-13 DIAGNOSIS — C3430 Malignant neoplasm of lower lobe, unspecified bronchus or lung: Secondary | ICD-10-CM

## 2014-06-13 LAB — CBC WITH DIFFERENTIAL/PLATELET
BASO%: 0.7 % (ref 0.0–2.0)
Basophils Absolute: 0 10*3/uL (ref 0.0–0.1)
EOS ABS: 0.2 10*3/uL (ref 0.0–0.5)
EOS%: 4 % (ref 0.0–7.0)
HEMATOCRIT: 39.2 % (ref 38.4–49.9)
HGB: 13.3 g/dL (ref 13.0–17.1)
LYMPH%: 23.5 % (ref 14.0–49.0)
MCH: 31 pg (ref 27.2–33.4)
MCHC: 33.9 g/dL (ref 32.0–36.0)
MCV: 91.5 fL (ref 79.3–98.0)
MONO#: 0.5 10*3/uL (ref 0.1–0.9)
MONO%: 8.8 % (ref 0.0–14.0)
NEUT#: 3.8 10*3/uL (ref 1.5–6.5)
NEUT%: 63 % (ref 39.0–75.0)
PLATELETS: 123 10*3/uL — AB (ref 140–400)
RBC: 4.28 10*6/uL (ref 4.20–5.82)
RDW: 13 % (ref 11.0–14.6)
WBC: 6 10*3/uL (ref 4.0–10.3)
lymph#: 1.4 10*3/uL (ref 0.9–3.3)

## 2014-06-13 LAB — COMPREHENSIVE METABOLIC PANEL (CC13)
ALBUMIN: 4.1 g/dL (ref 3.5–5.0)
ALK PHOS: 58 U/L (ref 40–150)
ALT: 25 U/L (ref 0–55)
AST: 20 U/L (ref 5–34)
Anion Gap: 8 mEq/L (ref 3–11)
BUN: 11.1 mg/dL (ref 7.0–26.0)
CO2: 28 mEq/L (ref 22–29)
CREATININE: 1 mg/dL (ref 0.7–1.3)
Calcium: 9.7 mg/dL (ref 8.4–10.4)
Chloride: 104 mEq/L (ref 98–109)
GLUCOSE: 109 mg/dL (ref 70–140)
POTASSIUM: 4.2 meq/L (ref 3.5–5.1)
Sodium: 140 mEq/L (ref 136–145)
Total Bilirubin: 0.7 mg/dL (ref 0.20–1.20)
Total Protein: 7.2 g/dL (ref 6.4–8.3)

## 2014-06-13 MED ORDER — IOHEXOL 300 MG/ML  SOLN
100.0000 mL | Freq: Once | INTRAMUSCULAR | Status: AC | PRN
  Administered 2014-06-13: 100 mL via INTRAVENOUS

## 2014-06-14 ENCOUNTER — Ambulatory Visit: Payer: Medicare Other | Admitting: Thoracic Surgery (Cardiothoracic Vascular Surgery)

## 2014-06-20 ENCOUNTER — Telehealth: Payer: Self-pay | Admitting: Internal Medicine

## 2014-06-20 ENCOUNTER — Encounter: Payer: Self-pay | Admitting: Internal Medicine

## 2014-06-20 ENCOUNTER — Ambulatory Visit (HOSPITAL_BASED_OUTPATIENT_CLINIC_OR_DEPARTMENT_OTHER): Payer: Medicare Other | Admitting: Internal Medicine

## 2014-06-20 VITALS — BP 124/55 | HR 57 | Temp 97.9°F | Resp 18 | Ht 67.0 in | Wt 189.0 lb

## 2014-06-20 DIAGNOSIS — C3492 Malignant neoplasm of unspecified part of left bronchus or lung: Secondary | ICD-10-CM

## 2014-06-20 DIAGNOSIS — Z85118 Personal history of other malignant neoplasm of bronchus and lung: Secondary | ICD-10-CM

## 2014-06-20 NOTE — Telephone Encounter (Signed)
gv and printed appt sched and avs for pt for July 2016

## 2014-06-20 NOTE — Progress Notes (Signed)
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   PRIOR THERAPY:  1. Status post concurrent chemoradiation with weekly carboplatin and paclitaxel, last dose was given 04/08/2011.  2. Status post left lower lobectomy with mediastinal lymph node dissection under the care of Dr. Roxan Hockey on 06/11/2011 with no evidence for residual disease.  CURRENT THERAPY: Observation.  INTERVAL HISTORY: Rodney Christensen 73 y.o. male returns to the clinic today for six-month followup visit accompanied by his wife. The patient is feeling fine today with no specific complaints. He denied having any significant chest pain, shortness of breath, cough or hemoptysis. He denied having any significant weight loss or night sweats. He has repeat CT scan of the chest performed recently and he is here for evaluation and discussion of his scan results.  MEDICAL HISTORY: Past Medical History  Diagnosis Date  . CAD, NATIVE VESSEL 03/22/2010  . 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  . HYPERTENSION, UNSPECIFIED 03/17/2009  . HYPERLIPIDEMIA-MIXED 03/17/2009  . History of radiation therapy 03/18/11 to 04/19/11    lower left lung  . BPH (benign prostatic hyperplasia)   . Macular degeneration   . Diverticulosis   . Squamous cell carcinoma lung     Left lower lobe   . Skin cancer     melanoma-nose    ALLERGIES:  is allergic to cephalexin and latanoprost.  MEDICATIONS:  Current Outpatient Prescriptions  Medication Sig Dispense Refill  . amLODipine (NORVASC) 10 MG tablet Take 10 mg by mouth daily.      . Ascorbic Acid (VITAMIN C) 500 MG tablet Take 500 mg by mouth daily.       Marland Kitchen aspirin 81 MG tablet Take 81 mg by mouth daily.        . B Complex Vitamins (B COMPLEX 100 PO) Take by  mouth daily.        . beta carotene 25000 UNIT capsule Take 25,000 Units by mouth daily.        . Bilberry 500 MG CAPS Take 500 mg by mouth daily.       . bimatoprost (LUMIGAN) 0.03 % ophthalmic drops Place 1 drop into both eyes at bedtime.        . Calcium-Magnesium-Zinc (CAL-MAG-ZINC PO) Take by mouth as directed.        . Chelated Zinc 50 MG TABS Take by mouth daily.        . Chromium Picolinate 500 MCG TABS Take by mouth daily.        . Cinnamon 500 MG capsule Take 1,000 mg by mouth daily.       . Coenzyme Q10 (CO Q 10) 100 MG CAPS Take by mouth daily.        . dorzolamide (TRUSOPT) 2 % ophthalmic solution Place 1 drop into both eyes 2 (two) times daily as needed.      . doxazosin (CARDURA) 8 MG tablet Take 8 mg by mouth at bedtime.        . fish oil-omega-3 fatty acids 1000 MG capsule Take 2 g by mouth 3 (three) times daily.        . folic acid (FOLVITE) 1 MG tablet Take 1 mg by mouth daily.        . Garlic 2376 MG CAPS Take by mouth  daily.        . Ginkgo Biloba (GINKOBA) 40 MG TABS Take by mouth daily.        Marland Kitchen lisinopril (PRINIVIL,ZESTRIL) 40 MG tablet Take 40 mg by mouth 2 (two) times daily.       . LUTEIN PO Take by mouth 2 (two) times daily.        . Melatonin 1 MG CAPS Take by mouth at bedtime.        . metoprolol (LOPRESSOR) 50 MG tablet Take 50 mg by mouth 2 (two) times daily.        . Multiple Vitamin (MULTIVITAMIN) tablet Take 1 tablet by mouth daily.        . potassium chloride SA (K-DUR,KLOR-CON) 20 MEQ tablet       . simvastatin (ZOCOR) 20 MG tablet Take 20 mg by mouth every evening.      . torsemide (DEMADEX) 20 MG tablet 20 mg daily.       . traMADol (ULTRAM) 50 MG tablet TAKE 1 OR 2 TABLETS BY MOUTH EVERY 4-6 HOURS AS NEEDED FOR PAIN  40 tablet  0   No current facility-administered medications for this visit.    SURGICAL HISTORY:  Past Surgical History  Procedure Laterality Date  . Coronary artery bypass graft  10/10/2004    x5  . Carotid endarterectomy    .  Bronchoscopy  06/11/11    [facility]  . Lt vats,lt thoacotomy,lt lower lobectomy with  mediastinal node  dissection  06/11/11    [facility]    REVIEW OF SYSTEMS:  A comprehensive review of systems was negative.   PHYSICAL EXAMINATION: General appearance: alert, cooperative and no distress Head: Normocephalic, without obvious abnormality, atraumatic Neck: no adenopathy Lymph nodes: Cervical, supraclavicular, and axillary nodes normal. Resp: clear to auscultation bilaterally Cardio: regular rate and rhythm, S1, S2 normal, no murmur, click, rub or gallop GI: soft, non-tender; bowel sounds normal; no masses,  no organomegaly Extremities: extremities normal, atraumatic, no cyanosis or edema  ECOG PERFORMANCE STATUS: 1 - Symptomatic but completely ambulatory  Blood pressure 124/55, pulse 57, temperature 97.9 F (36.6 C), temperature source Oral, resp. rate 18, height 5\' 7"  (1.702 m), weight 189 lb (85.73 kg), SpO2 97.00%.  LABORATORY DATA: Lab Results  Component Value Date   WBC 6.0 06/13/2014   HGB 13.3 06/13/2014   HCT 39.2 06/13/2014   MCV 91.5 06/13/2014   PLT 123* 06/13/2014      Chemistry      Component Value Date/Time   NA 140 06/13/2014 0802   NA 141 06/25/2012 0800   NA 139 09/09/2011 1037   K 4.2 06/13/2014 0802   K 3.6 06/25/2012 0800   K 3.7 09/09/2011 1037   CL 102 05/17/2013 0754   CL 100 06/25/2012 0800   CL 101 09/09/2011 1037   CO2 28 06/13/2014 0802   CO2 27 06/25/2012 0800   CO2 27 09/09/2011 1037   BUN 11.1 06/13/2014 0802   BUN 11 06/25/2012 0800   BUN 10 09/09/2011 1037   CREATININE 1.0 06/13/2014 0802   CREATININE 0.8 06/25/2012 0800   CREATININE 0.86 09/09/2011 1037      Component Value Date/Time   CALCIUM 9.7 06/13/2014 0802   CALCIUM 9.4 06/25/2012 0800   CALCIUM 9.0 09/09/2011 1037   ALKPHOS 58 06/13/2014 0802   ALKPHOS 61 06/25/2012 0800   ALKPHOS 73 09/09/2011 1037   AST 20 06/13/2014 0802   AST 24 06/25/2012 0800   AST 16 09/09/2011 1037  ALT 25 06/13/2014  0802   ALT 37 06/25/2012 0800   ALT 15 09/09/2011 1037   BILITOT 0.70 06/13/2014 0802   BILITOT 1.00 06/25/2012 0800   BILITOT 0.4 09/09/2011 1037       RADIOGRAPHIC STUDIES:  Ct Chest W Contrast  06/13/2014   CLINICAL DATA:  Squamous cell lung cancer diagnosed 2012, status post left lower lobectomy, chemotherapy and XRT complete.  EXAM: CT CHEST WITH CONTRAST  TECHNIQUE: Multidetector CT imaging of the chest was performed during intravenous contrast administration.  CONTRAST:  181mL OMNIPAQUE IOHEXOL 300 MG/ML  SOLN  COMPARISON:  12/13/2013.  FINDINGS: Status post left lower lobectomy with volume loss and radiation changes in the posterior left lower hemithorax. Trace loculated pleural fluid medially in the left lower hemithorax, unchanged. No abnormal soft tissue to suggest recurrent disease.  No new/suspicious pulmonary nodules. Mild emphysematous changes. No pneumothorax.  Visualized thyroid is mildly nodular/ heterogeneous.  The heart is normal in size. Leftward mediastinal shift. Coronary atherosclerosis. Postsurgical changes related to prior CABG. Atherosclerotic calcifications of the aortic arch.  No suspicious mediastinal, hilar, or axillary lymphadenopathy.  Visualized upper abdomen is notable for layering gallstones and vascular calcifications.  Degenerative changes of the visualized thoracolumbar spine.  IMPRESSION: Status post left lower lobectomy.  Stable radiation changes in the left lower hemithorax.  No evidence of recurrent or metastatic disease.   Electronically Signed   By: Julian Hy M.D.   On: 06/13/2014 09:50   ASSESSMENT AND PLAN: This is a very pleasant 73 years old white male with history of stage IIB non-small cell lung cancer status post concurrent chemoradiation followed by left lower lobectomy and mediastinal lymph node dissection with no evidence for residual disease. The patient has been observation since July of 2012 with no evidence for disease recurrence. I  discussed the scan results with the patient and his wife. I recommended for him to continue on observation with repeat CT scan of the chest in one year.  He would come back for followup visit at that time.  He was advised to call immediately she has any concerning symptoms in the interval.  All questions were answered. The patient knows to call the clinic with any problems, questions or concerns. We can certainly see the patient much sooner if necessary.  Disclaimer: This note was dictated with voice recognition software. Similar sounding words can inadvertently be transcribed and may not be corrected upon review.

## 2014-06-28 ENCOUNTER — Ambulatory Visit: Payer: Medicare Other | Admitting: Thoracic Surgery (Cardiothoracic Vascular Surgery)

## 2014-06-28 ENCOUNTER — Other Ambulatory Visit: Payer: Medicare Other

## 2014-07-19 ENCOUNTER — Encounter: Payer: Self-pay | Admitting: Thoracic Surgery (Cardiothoracic Vascular Surgery)

## 2014-07-19 ENCOUNTER — Ambulatory Visit (INDEPENDENT_AMBULATORY_CARE_PROVIDER_SITE_OTHER): Payer: Medicare Other | Admitting: Thoracic Surgery (Cardiothoracic Vascular Surgery)

## 2014-07-19 VITALS — BP 108/67 | HR 60 | Resp 20 | Ht 67.0 in | Wt 189.0 lb

## 2014-07-19 DIAGNOSIS — Z85118 Personal history of other malignant neoplasm of bronchus and lung: Secondary | ICD-10-CM

## 2014-07-19 NOTE — Progress Notes (Signed)
HPI:  Rodney Christensen returns today for a scheduled followup visit.  He is a 73 year old gentleman had a stage IIb (T3, N0) non-small cell carcinoma the left lower lobe. He was treated with neoadjuvant chemoradiation then underwent a left lower lobectomy and node dissection in July of 2012. Last saw him in the office in July 2014 which time he was doing well. He had no evidence of recurrent disease.  Over the past year he says his continued to do well. His weight is stable. He has not had any new issues with his respiratory status. He has not had any unusual headaches or visual changes. He saw Dr. Julien Nordmann in July.  Past Medical History  Diagnosis Date  . CAD, NATIVE VESSEL 03/22/2010  . 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  . HYPERTENSION, UNSPECIFIED 03/17/2009  . HYPERLIPIDEMIA-MIXED 03/17/2009  . History of radiation therapy 03/18/11 to 04/19/11    lower left lung  . BPH (benign prostatic hyperplasia)   . Macular degeneration   . Diverticulosis   . Squamous cell carcinoma lung     Left lower lobe   . Skin cancer     melanoma-nose      Current Outpatient Prescriptions  Medication Sig Dispense Refill  . amLODipine (NORVASC) 10 MG tablet Take 10 mg by mouth daily.      . Ascorbic Acid (VITAMIN C) 500 MG tablet Take 500 mg by mouth daily.       Marland Kitchen aspirin 81 MG tablet Take 81 mg by mouth daily.        . B Complex Vitamins (B COMPLEX 100 PO) Take by mouth daily.        . beta carotene 25000 UNIT capsule Take 25,000 Units by mouth daily.        . Bilberry 500 MG CAPS Take 500 mg by mouth daily.       . bimatoprost (LUMIGAN) 0.03 % ophthalmic drops Place 1 drop into both eyes at bedtime.        . Calcium-Magnesium-Zinc (CAL-MAG-ZINC PO) Take by mouth as directed.        . Chelated Zinc 50 MG TABS Take by mouth daily.        . Chromium Picolinate 500 MCG TABS Take by mouth daily.        . Cinnamon 500 MG capsule Take 1,000 mg by mouth daily.       . Coenzyme Q10 (CO Q 10) 100 MG CAPS Take by  mouth daily.        . dorzolamide (TRUSOPT) 2 % ophthalmic solution Place 1 drop into both eyes 2 (two) times daily as needed.      . doxazosin (CARDURA) 8 MG tablet Take 8 mg by mouth at bedtime.        . fish oil-omega-3 fatty acids 1000 MG capsule Take 2 g by mouth 3 (three) times daily.        . folic acid (FOLVITE) 1 MG tablet Take 1 mg by mouth daily.        . Garlic 9563 MG CAPS Take by mouth daily.        . Ginkgo Biloba (GINKOBA) 40 MG TABS Take by mouth daily.        Marland Kitchen lisinopril (PRINIVIL,ZESTRIL) 40 MG tablet Take 40 mg by mouth 2 (two) times daily.       . LUTEIN PO Take by mouth 2 (two) times daily.        . Melatonin 1 MG CAPS Take by mouth at bedtime.        Marland Kitchen  metoprolol (LOPRESSOR) 50 MG tablet Take 50 mg by mouth 2 (two) times daily.        . Multiple Vitamin (MULTIVITAMIN) tablet Take 1 tablet by mouth daily.        . potassium chloride SA (K-DUR,KLOR-CON) 20 MEQ tablet       . simvastatin (ZOCOR) 20 MG tablet Take 20 mg by mouth every evening.      . torsemide (DEMADEX) 20 MG tablet 20 mg daily.        No current facility-administered medications for this visit.   Review of systems See history of present illness  Physical Exam BP 108/67  Pulse 60  Resp 20  Ht 5\' 7"  (1.702 m)  Wt 189 lb (85.73 kg)  BMI 29.59 kg/m2  SpO30 58% 73 year old man in no acute distress Well-developed and well-nourished Alert and oriented x3 with no focal neurologic deficits No cervical or subclavicular adenopathy Cardiac regular rate and rhythm normal S1 and S2 Lungs diminished at left base otherwise clear   Diagnostic Tests: CT CHEST WITH CONTRAST  TECHNIQUE:  Multidetector CT imaging of the chest was performed during  intravenous contrast administration.  CONTRAST: 111mL OMNIPAQUE IOHEXOL 300 MG/ML SOLN  COMPARISON: 12/13/2013.  FINDINGS:  Status post left lower lobectomy with volume loss and radiation  changes in the posterior left lower hemithorax. Trace loculated  pleural  fluid medially in the left lower hemithorax, unchanged. No  abnormal soft tissue to suggest recurrent disease.  No new/suspicious pulmonary nodules. Mild emphysematous changes. No  pneumothorax.  Visualized thyroid is mildly nodular/ heterogeneous.  The heart is normal in size. Leftward mediastinal shift. Coronary  atherosclerosis. Postsurgical changes related to prior CABG.  Atherosclerotic calcifications of the aortic arch.  No suspicious mediastinal, hilar, or axillary lymphadenopathy.  Visualized upper abdomen is notable for layering gallstones and  vascular calcifications.  Degenerative changes of the visualized thoracolumbar spine.  IMPRESSION:  Status post left lower lobectomy.  Stable radiation changes in the left lower hemithorax.  No evidence of recurrent or metastatic disease.  Electronically Signed  By: Julian Hy M.D.  On: 06/13/2014 09:50    Impression: 73 year old gentleman who is now 3 years post left lower lobectomy following neoadjuvant chemoradiation for a stage IIb non-small cell carcinoma. He has no evidence recurrent disease. He continues to do well.   Plan: Return in one year after CT of chest

## 2015-06-14 ENCOUNTER — Other Ambulatory Visit: Payer: Self-pay | Admitting: Thoracic Surgery (Cardiothoracic Vascular Surgery)

## 2015-06-14 DIAGNOSIS — C3492 Malignant neoplasm of unspecified part of left bronchus or lung: Secondary | ICD-10-CM

## 2015-06-19 ENCOUNTER — Other Ambulatory Visit (HOSPITAL_BASED_OUTPATIENT_CLINIC_OR_DEPARTMENT_OTHER): Payer: Medicare Other

## 2015-06-19 ENCOUNTER — Encounter (HOSPITAL_COMMUNITY): Payer: Self-pay

## 2015-06-19 ENCOUNTER — Ambulatory Visit (HOSPITAL_COMMUNITY)
Admission: RE | Admit: 2015-06-19 | Discharge: 2015-06-19 | Disposition: A | Discharge status: Home / Self Care | Payer: Medicare Other | Source: Ambulatory Visit | Attending: Internal Medicine | Admitting: Internal Medicine

## 2015-06-19 DIAGNOSIS — Z85118 Personal history of other malignant neoplasm of bronchus and lung: Secondary | ICD-10-CM | POA: Diagnosis not present

## 2015-06-19 DIAGNOSIS — C3492 Malignant neoplasm of unspecified part of left bronchus or lung: Secondary | ICD-10-CM

## 2015-06-19 DIAGNOSIS — C3490 Malignant neoplasm of unspecified part of unspecified bronchus or lung: Secondary | ICD-10-CM | POA: Insufficient documentation

## 2015-06-19 DIAGNOSIS — J90 Pleural effusion, not elsewhere classified: Secondary | ICD-10-CM | POA: Insufficient documentation

## 2015-06-19 DIAGNOSIS — Z08 Encounter for follow-up examination after completed treatment for malignant neoplasm: Secondary | ICD-10-CM | POA: Diagnosis present

## 2015-06-19 LAB — CBC WITH DIFFERENTIAL/PLATELET
BASO%: 0.9 % (ref 0.0–2.0)
BASOS ABS: 0 10*3/uL (ref 0.0–0.1)
EOS%: 3.6 % (ref 0.0–7.0)
Eosinophils Absolute: 0.2 10*3/uL (ref 0.0–0.5)
HCT: 40 % (ref 38.4–49.9)
HGB: 13.6 g/dL (ref 13.0–17.1)
LYMPH#: 1.2 10*3/uL (ref 0.9–3.3)
LYMPH%: 21.1 % (ref 14.0–49.0)
MCH: 30.4 pg (ref 27.2–33.4)
MCHC: 33.9 g/dL (ref 32.0–36.0)
MCV: 89.5 fL (ref 79.3–98.0)
MONO#: 0.4 10*3/uL (ref 0.1–0.9)
MONO%: 7.5 % (ref 0.0–14.0)
NEUT%: 66.9 % (ref 39.0–75.0)
NEUTROS ABS: 3.9 10*3/uL (ref 1.5–6.5)
Platelets: 144 10*3/uL (ref 140–400)
RBC: 4.47 10*6/uL (ref 4.20–5.82)
RDW: 12.9 % (ref 11.0–14.6)
WBC: 5.8 10*3/uL (ref 4.0–10.3)

## 2015-06-19 LAB — COMPREHENSIVE METABOLIC PANEL (CC13)
ALT: 18 U/L (ref 0–55)
AST: 17 U/L (ref 5–34)
Albumin: 4 g/dL (ref 3.5–5.0)
Alkaline Phosphatase: 66 U/L (ref 40–150)
Anion Gap: 8 mEq/L (ref 3–11)
BILIRUBIN TOTAL: 0.64 mg/dL (ref 0.20–1.20)
BUN: 8.8 mg/dL (ref 7.0–26.0)
CALCIUM: 9.4 mg/dL (ref 8.4–10.4)
CHLORIDE: 105 meq/L (ref 98–109)
CO2: 25 meq/L (ref 22–29)
Creatinine: 0.9 mg/dL (ref 0.7–1.3)
EGFR: 84 mL/min/{1.73_m2} — AB (ref >90)
GLUCOSE: 103 mg/dL (ref 70–140)
POTASSIUM: 3.6 meq/L (ref 3.5–5.1)
SODIUM: 139 meq/L (ref 136–145)
Total Protein: 7.2 g/dL (ref 6.4–8.3)

## 2015-06-19 MED ORDER — IOHEXOL 300 MG/ML  SOLN
100.0000 mL | Freq: Once | INTRAMUSCULAR | Status: AC | PRN
  Administered 2015-06-19: 80 mL via INTRAVENOUS

## 2015-06-26 ENCOUNTER — Encounter: Payer: Self-pay | Admitting: Internal Medicine

## 2015-06-26 ENCOUNTER — Ambulatory Visit (HOSPITAL_BASED_OUTPATIENT_CLINIC_OR_DEPARTMENT_OTHER): Payer: Medicare Other | Admitting: Internal Medicine

## 2015-06-26 ENCOUNTER — Telehealth: Payer: Self-pay | Admitting: Internal Medicine

## 2015-06-26 VITALS — BP 133/54 | HR 59 | Temp 98.4°F | Resp 18 | Ht 67.0 in | Wt 186.4 lb

## 2015-06-26 DIAGNOSIS — C3492 Malignant neoplasm of unspecified part of left bronchus or lung: Secondary | ICD-10-CM

## 2015-06-26 DIAGNOSIS — Z85118 Personal history of other malignant neoplasm of bronchus and lung: Secondary | ICD-10-CM | POA: Diagnosis not present

## 2015-06-26 NOTE — Progress Notes (Signed)
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   PRIOR THERAPY:  1. Status post concurrent chemoradiation with weekly carboplatin and paclitaxel, last dose was given 04/08/2011.  2. Status post left lower lobectomy with mediastinal lymph node dissection under the care of Dr. Roxan Hockey on 06/11/2011 with no evidence for residual disease.  CURRENT THERAPY: Observation.  INTERVAL HISTORY: Rodney Christensen 74 y.o. male returns to the clinic today for annual followup visit accompanied by his wife. He has been observation for the last 4 years. The patient is feeling fine today with no specific complaints. He denied having any significant chest pain, shortness of breath, cough or hemoptysis. He denied having any significant weight loss or night sweats. He has repeat CT scan of the chest performed recently and he is here for evaluation and discussion of his scan results.  MEDICAL HISTORY: Past Medical History  Diagnosis Date  . CAD, NATIVE VESSEL 03/22/2010  . 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  . HYPERTENSION, UNSPECIFIED 03/17/2009  . HYPERLIPIDEMIA-MIXED 03/17/2009  . History of radiation therapy 03/18/11 to 04/19/11    lower left lung  . BPH (benign prostatic hyperplasia)   . Macular degeneration   . Diverticulosis   . Squamous cell carcinoma lung     Left lower lobe   . Skin cancer     melanoma-nose    ALLERGIES:  is allergic to cephalexin and latanoprost.  MEDICATIONS:  Current Outpatient Prescriptions  Medication Sig Dispense Refill  . amLODipine (NORVASC) 10 MG tablet Take 10 mg by mouth daily.    . Ascorbic Acid (VITAMIN C) 500 MG tablet Take 500 mg by mouth daily.     . B Complex Vitamins (B COMPLEX 100 PO) Take by mouth daily.        . beta carotene 25000 UNIT capsule Take 25,000 Units by mouth daily.      . Bilberry 500 MG CAPS Take 500 mg by mouth daily.     . bimatoprost (LUMIGAN) 0.03 % ophthalmic drops Place 1 drop into both eyes at bedtime.      . Calcium-Magnesium-Zinc (CAL-MAG-ZINC PO) Take by mouth as directed.      . Chelated Zinc 50 MG TABS Take by mouth daily.      . Cinnamon 500 MG capsule Take 1,000 mg by mouth daily.     . Coenzyme Q10 (CO Q 10) 100 MG CAPS Take by mouth daily.      . dorzolamide (TRUSOPT) 2 % ophthalmic solution Place 1 drop into both eyes 2 (two) times daily as needed.    . dorzolamide-timolol (COSOPT) 22.3-6.8 MG/ML ophthalmic solution Place 1 drop into the left eye at bedtime.  5  . doxazosin (CARDURA) 8 MG tablet Take 8 mg by mouth at bedtime.      . fish oil-omega-3 fatty acids 1000 MG capsule Take 2 g by mouth 3 (three) times daily.      . folic acid (FOLVITE) 1 MG tablet Take 1 mg by mouth daily.      . Garlic 3151 MG CAPS Take by mouth daily.      . Ginkgo Biloba (GINKOBA) 40 MG TABS Take by mouth daily.      Marland Kitchen lisinopril (PRINIVIL,ZESTRIL) 40 MG tablet Take 40 mg by mouth 2 (  two) times daily.     Marland Kitchen LUMIGAN 0.01 % SOLN Place 1 drop into the left eye at bedtime.  5  . LUTEIN PO Take by mouth 2 (two) times daily.      . Melatonin 1 MG CAPS Take by mouth at bedtime.      . metoprolol (LOPRESSOR) 50 MG tablet Take 50 mg by mouth 2 (two) times daily.      . Multiple Vitamin (MULTIVITAMIN) tablet Take 1 tablet by mouth daily.      . potassium chloride SA (K-DUR,KLOR-CON) 20 MEQ tablet     . simvastatin (ZOCOR) 20 MG tablet Take 20 mg by mouth every evening.    . torsemide (DEMADEX) 20 MG tablet 20 mg daily.      No current facility-administered medications for this visit.    SURGICAL HISTORY:  Past Surgical History  Procedure Laterality Date  . Coronary artery bypass graft  10/10/2004    x5  . Carotid endarterectomy    . Bronchoscopy  06/11/11    [facility]  . Lt vats,lt  thoacotomy,lt lower lobectomy with  mediastinal node  dissection  06/11/11    [facility]    REVIEW OF SYSTEMS:  A comprehensive review of systems was negative.   PHYSICAL EXAMINATION: General appearance: alert, cooperative and no distress Head: Normocephalic, without obvious abnormality, atraumatic Neck: no adenopathy Lymph nodes: Cervical, supraclavicular, and axillary nodes normal. Resp: clear to auscultation bilaterally Cardio: regular rate and rhythm, S1, S2 normal, no murmur, click, rub or gallop GI: soft, non-tender; bowel sounds normal; no masses,  no organomegaly Extremities: extremities normal, atraumatic, no cyanosis or edema  ECOG PERFORMANCE STATUS: 1 - Symptomatic but completely ambulatory  Blood pressure 133/54, pulse 59, temperature 98.4 F (36.9 C), temperature source Oral, resp. rate 18, height '5\' 7"'$  (1.702 m), weight 186 lb 6.4 oz (84.55 kg), SpO2 97 %.  LABORATORY DATA: Lab Results  Component Value Date   WBC 5.8 06/19/2015   HGB 13.6 06/19/2015   HCT 40.0 06/19/2015   MCV 89.5 06/19/2015   PLT 144 06/19/2015      Chemistry      Component Value Date/Time   NA 139 06/19/2015 0758   NA 141 06/25/2012 0800   NA 139 09/09/2011 1037   K 3.6 06/19/2015 0758   K 3.6 06/25/2012 0800   K 3.7 09/09/2011 1037   CL 102 05/17/2013 0754   CL 100 06/25/2012 0800   CL 101 09/09/2011 1037   CO2 25 06/19/2015 0758   CO2 27 06/25/2012 0800   CO2 27 09/09/2011 1037   BUN 8.8 06/19/2015 0758   BUN 11 06/25/2012 0800   BUN 10 09/09/2011 1037   CREATININE 0.9 06/19/2015 0758   CREATININE 0.8 06/25/2012 0800   CREATININE 0.86 09/09/2011 1037      Component Value Date/Time   CALCIUM 9.4 06/19/2015 0758   CALCIUM 9.4 06/25/2012 0800   CALCIUM 9.0 09/09/2011 1037   ALKPHOS 66 06/19/2015 0758   ALKPHOS 61 06/25/2012 0800   ALKPHOS 73 09/09/2011 1037   AST 17 06/19/2015 0758   AST 24 06/25/2012 0800   AST 16 09/09/2011 1037   ALT 18 06/19/2015 0758   ALT 37  06/25/2012 0800   ALT 15 09/09/2011 1037   BILITOT 0.64 06/19/2015 0758   BILITOT 1.00 06/25/2012 0800   BILITOT 0.4 09/09/2011 1037       RADIOGRAPHIC STUDIES: Ct Chest W Contrast  06/19/2015   CLINICAL DATA:  Lung cancer, chemotherapy and radiation  therapy complete.  EXAM: CT CHEST WITH CONTRAST  TECHNIQUE: Multidetector CT imaging of the chest was performed during intravenous contrast administration.  CONTRAST:  76m OMNIPAQUE IOHEXOL 300 MG/ML  SOLN  COMPARISON:  06/13/2014.  FINDINGS: Mediastinum/Nodes: No pathologically enlarged mediastinal no pathologically enlarged mediastinal or axillary lymph nodes. No definite hilar adenopathy. Heart is mildly enlarged. No pericardial effusion.  Lungs/Pleura: Small loculated left pleural effusion, stable. Postoperative changes of left lower lobectomy with collapse/consolidation, bronchiectasis and volume loss in the left perihilar region, unchanged and related to radiation therapy. Minimal biapical pleural parenchymal scarring. Airway is otherwise unremarkable.  Upper abdomen: Visualized portion of the liver is unremarkable. Stone is seen in the gallbladder. Visualized portions of the adrenal gland, kidneys, spleen, pancreas, stomach and bowel are grossly unremarkable with the exception of a small hiatal hernia. No upper abdominal adenopathy.  Musculoskeletal: Not worrisome lytic or sclerotic lesions. Degenerative changes are seen in the spine. Thoracotomy changes on the left.  IMPRESSION: 1. Postoperative changes of left lower lobectomy with radiation changes in the left perihilar region and small loculated left pleural effusion, stable. No evidence of metastatic disease. 2. Cholelithiasis.   Electronically Signed   By: MLorin PicketM.D.   On: 06/19/2015 09:40   ASSESSMENT AND PLAN: This is a very pleasant 74years old white male with history of stage IIB non-small cell lung cancer status post concurrent chemoradiation followed by left lower lobectomy and  mediastinal lymph node dissection with no evidence for residual disease. The patient has been observation since July of 2012 with no evidence for disease recurrence. I discussed the scan results with the patient and his wife. I recommended for him to continue on observation with follow-up visit and repeat CT scan of the chest in one year.  He was advised to call immediately she has any concerning symptoms in the interval.  All questions were answered. The patient knows to call the clinic with any problems, questions or concerns. We can certainly see the patient much sooner if necessary.  Disclaimer: This note was dictated with voice recognition software. Similar sounding words can inadvertently be transcribed and may not be corrected upon review.

## 2015-06-26 NOTE — Telephone Encounter (Signed)
Gave and printed appt sched and avs for pt for July 2017

## 2015-07-11 ENCOUNTER — Encounter: Payer: Self-pay | Admitting: Thoracic Surgery (Cardiothoracic Vascular Surgery)

## 2015-07-11 ENCOUNTER — Ambulatory Visit (INDEPENDENT_AMBULATORY_CARE_PROVIDER_SITE_OTHER): Payer: Medicare Other | Admitting: Thoracic Surgery (Cardiothoracic Vascular Surgery)

## 2015-07-11 VITALS — BP 127/73 | HR 64 | Resp 20 | Ht 67.0 in | Wt 191.0 lb

## 2015-07-11 DIAGNOSIS — Z85118 Personal history of other malignant neoplasm of bronchus and lung: Secondary | ICD-10-CM | POA: Diagnosis not present

## 2015-07-11 DIAGNOSIS — C4490 Unspecified malignant neoplasm of skin, unspecified: Secondary | ICD-10-CM

## 2015-07-11 NOTE — Progress Notes (Signed)
[address]       [place] [address]             [phone]       HPI:  Mr. Netz returns for a scheduled 1 year follow-up.  He is now 4 years out from new adjuvant chemoradiation followed by a left lower lobectomy for a T3, N0, stage IIB non-small cell carcinoma. I last saw him in the office a year ago which time he was doing well with no evidence of recurrence.  Overall he's been feeling well. He had surgery earlier this year on his right leg. He and his wife think that was a melanoma although they are not 583% certain. He also had a Mohs surgery for a basal cell on his face. His appetite is good and his weight is stable. He has not had any unusual headaches or visual changes. He get short of breath with heavy exertion, that has not changed.  Past Medical History  Diagnosis Date  . CAD, NATIVE VESSEL 03/22/2010  . 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  . HYPERTENSION, UNSPECIFIED 03/17/2009  . HYPERLIPIDEMIA-MIXED 03/17/2009  . History of radiation therapy 03/18/11 to 04/19/11    lower left lung  . BPH (benign prostatic hyperplasia)   . Macular degeneration   . Diverticulosis   . Squamous cell carcinoma lung     Left lower lobe   . Skin cancer     melanoma-nose      Current Outpatient Prescriptions  Medication Sig Dispense Refill  . amLODipine (NORVASC) 10 MG tablet Take 10 mg by mouth daily.    . Ascorbic Acid (VITAMIN C) 500 MG tablet Take 500 mg by mouth daily.     . B Complex Vitamins (B COMPLEX 100 PO) Take by mouth daily.      . beta carotene 25000 UNIT capsule Take 25,000 Units by mouth daily.      . Bilberry 500 MG CAPS Take 500 mg by mouth daily.     . bimatoprost (LUMIGAN) 0.03 % ophthalmic drops Place 1 drop into both eyes at bedtime.      . Calcium-Magnesium-Zinc (CAL-MAG-ZINC PO) Take by mouth as directed.      . Chelated Zinc 50 MG TABS Take by mouth daily.      . Cinnamon 500 MG capsule Take 1,000 mg by mouth daily.     . Coenzyme Q10 (CO  Q 10) 100 MG CAPS Take by mouth daily.      . dorzolamide-timolol (COSOPT) 22.3-6.8 MG/ML ophthalmic solution Place 1 drop into the left eye at bedtime.  5  . doxazosin (CARDURA) 8 MG tablet Take 8 mg by mouth at bedtime.      . fish oil-omega-3 fatty acids 1000 MG capsule Take 2 g by mouth 3 (three) times daily.      . folic acid (FOLVITE) 1 MG tablet Take 1 mg by mouth daily.      . Garlic 0940 MG CAPS Take by mouth daily.      . Ginkgo Biloba (GINKOBA) 40 MG TABS Take by mouth daily.      Marland Kitchen lisinopril (PRINIVIL,ZESTRIL) 40 MG tablet Take 40 mg by mouth 2 (two) times daily.     . LUTEIN PO Take by mouth 2 (two) times daily.      . Melatonin 1 MG CAPS Take by mouth at bedtime.      . metoprolol (LOPRESSOR) 50 MG tablet Take 50 mg by mouth 2 (two) times  daily.      . Multiple Vitamin (MULTIVITAMIN) tablet Take 1 tablet by mouth daily.      . potassium chloride SA (K-DUR,KLOR-CON) 20 MEQ tablet     . simvastatin (ZOCOR) 20 MG tablet Take 20 mg by mouth every evening.    . torsemide (DEMADEX) 20 MG tablet 20 mg daily.     . dorzolamide (TRUSOPT) 2 % ophthalmic solution Place 1 drop into both eyes 2 (two) times daily as needed.    Marland Kitchen LUMIGAN 0.01 % SOLN Place 1 drop into the left eye at bedtime.  5   No current facility-administered medications for this visit.    Physical Exam BP 127/73 mmHg  Pulse 64  Resp 20  Ht '5\' 7"'$  (1.702 m)  Wt 191 lb (86.637 kg)  BMI 29.91 kg/m2  SpO19 21% 74 year old man in no acute distress Alert and oriented 3 with no focal deficits No cervical or supraclavicular adenopathy Incisions well healed Lungs diminished in left base, otherwise clear Cardiac regular rate and rhythm normal S1 and S2 Healing wound right leg  Diagnostic Tests: CT CHEST WITH CONTRAST  TECHNIQUE: Multidetector CT imaging of the chest was performed during intravenous contrast administration.  CONTRAST: 70m OMNIPAQUE IOHEXOL 300 MG/ML SOLN  COMPARISON:  06/13/2014.  FINDINGS: Mediastinum/Nodes: No pathologically enlarged mediastinal no pathologically enlarged mediastinal or axillary lymph nodes. No definite hilar adenopathy. Heart is mildly enlarged. No pericardial effusion.  Lungs/Pleura: Small loculated left pleural effusion, stable. Postoperative changes of left lower lobectomy with collapse/consolidation, bronchiectasis and volume loss in the left perihilar region, unchanged and related to radiation therapy. Minimal biapical pleural parenchymal scarring. Airway is otherwise unremarkable.  Upper abdomen: Visualized portion of the liver is unremarkable. Stone is seen in the gallbladder. Visualized portions of the adrenal gland, kidneys, spleen, pancreas, stomach and bowel are grossly unremarkable with the exception of a small hiatal hernia. No upper abdominal adenopathy.  Musculoskeletal: Not worrisome lytic or sclerotic lesions. Degenerative changes are seen in the spine. Thoracotomy changes on the left.  IMPRESSION: 1. Postoperative changes of left lower lobectomy with radiation changes in the left perihilar region and small loculated left pleural effusion, stable. No evidence of metastatic disease. 2. Cholelithiasis.   Electronically Signed  By: MLorin PicketM.D.  On: 06/19/2015 09:40   Impression: Mr. ESekis a 74year old gentleman who is now 4 years out from neodjuvant chemoradiation followed by a left lower lobectomy for a stage IIb non-small cell carcinoma. He has no evidence of recurrent disease.  Overall he is doing well. He did have a skin cancer removed from his right leg. He is under the impression this is a melanoma. I cannot find any pathology in Epic from that procedure. He has a follow-up with his dermatologist in the next couple of weeks.  Plan:  I will plan to see him back in one year for his 5 year follow-up visit. SMelrose Nakayama MD [facility] ([phone]

## 2015-08-01 ENCOUNTER — Ambulatory Visit: Payer: Medicare Other | Admitting: Thoracic Surgery (Cardiothoracic Vascular Surgery)

## 2015-08-01 ENCOUNTER — Other Ambulatory Visit: Payer: Medicare Other

## 2015-09-04 ENCOUNTER — Other Ambulatory Visit: Payer: Self-pay | Admitting: Gastroenterology

## 2016-06-17 ENCOUNTER — Encounter (HOSPITAL_COMMUNITY): Payer: Self-pay

## 2016-06-17 ENCOUNTER — Other Ambulatory Visit (HOSPITAL_BASED_OUTPATIENT_CLINIC_OR_DEPARTMENT_OTHER): Payer: Medicare Other

## 2016-06-17 ENCOUNTER — Ambulatory Visit (HOSPITAL_COMMUNITY)
Admission: RE | Admit: 2016-06-17 | Discharge: 2016-06-17 | Disposition: A | Discharge status: Home / Self Care | Payer: Medicare Other | Source: Ambulatory Visit | Attending: Internal Medicine | Admitting: Internal Medicine

## 2016-06-17 DIAGNOSIS — Z902 Acquired absence of lung [part of]: Secondary | ICD-10-CM | POA: Diagnosis not present

## 2016-06-17 DIAGNOSIS — C3492 Malignant neoplasm of unspecified part of left bronchus or lung: Secondary | ICD-10-CM

## 2016-06-17 DIAGNOSIS — K8020 Calculus of gallbladder without cholecystitis without obstruction: Secondary | ICD-10-CM | POA: Insufficient documentation

## 2016-06-17 DIAGNOSIS — Z85118 Personal history of other malignant neoplasm of bronchus and lung: Secondary | ICD-10-CM | POA: Diagnosis not present

## 2016-06-17 DIAGNOSIS — J90 Pleural effusion, not elsewhere classified: Secondary | ICD-10-CM | POA: Diagnosis not present

## 2016-06-17 LAB — COMPREHENSIVE METABOLIC PANEL
ALT: 20 U/L (ref 0–55)
AST: 18 U/L (ref 5–34)
Albumin: 4 g/dL (ref 3.5–5.0)
Alkaline Phosphatase: 62 U/L (ref 40–150)
Anion Gap: 10 mEq/L (ref 3–11)
BUN: 10 mg/dL (ref 7.0–26.0)
CO2: 27 mEq/L (ref 22–29)
CREATININE: 1 mg/dL (ref 0.7–1.3)
Calcium: 9.4 mg/dL (ref 8.4–10.4)
Chloride: 104 mEq/L (ref 98–109)
EGFR: 74 mL/min/{1.73_m2} — ABNORMAL LOW (ref >90)
GLUCOSE: 101 mg/dL (ref 70–140)
POTASSIUM: 4.1 meq/L (ref 3.5–5.1)
SODIUM: 140 meq/L (ref 136–145)
Total Bilirubin: 1.08 mg/dL (ref 0.20–1.20)
Total Protein: 7.5 g/dL (ref 6.4–8.3)

## 2016-06-17 LAB — CBC WITH DIFFERENTIAL/PLATELET
BASO%: 0.8 % (ref 0.0–2.0)
BASOS ABS: 0 10*3/uL (ref 0.0–0.1)
EOS%: 3.6 % (ref 0.0–7.0)
Eosinophils Absolute: 0.2 10*3/uL (ref 0.0–0.5)
HEMATOCRIT: 40.5 % (ref 38.4–49.9)
HGB: 13.8 g/dL (ref 13.0–17.1)
LYMPH#: 1.3 10*3/uL (ref 0.9–3.3)
LYMPH%: 24.3 % (ref 14.0–49.0)
MCH: 30.9 pg (ref 27.2–33.4)
MCHC: 34.2 g/dL (ref 32.0–36.0)
MCV: 90.6 fL (ref 79.3–98.0)
MONO#: 0.4 10*3/uL (ref 0.1–0.9)
MONO%: 7.4 % (ref 0.0–14.0)
NEUT#: 3.5 10*3/uL (ref 1.5–6.5)
NEUT%: 63.9 % (ref 39.0–75.0)
Platelets: 128 10*3/uL — ABNORMAL LOW (ref 140–400)
RBC: 4.47 10*6/uL (ref 4.20–5.82)
RDW: 13.2 % (ref 11.0–14.6)
WBC: 5.5 10*3/uL (ref 4.0–10.3)

## 2016-06-17 MED ORDER — IOPAMIDOL (ISOVUE-300) INJECTION 61%: 75.0000 mL | Freq: Once | INTRAVENOUS | Status: DC | PRN

## 2016-06-24 ENCOUNTER — Ambulatory Visit (HOSPITAL_BASED_OUTPATIENT_CLINIC_OR_DEPARTMENT_OTHER): Payer: Medicare Other | Admitting: Internal Medicine

## 2016-06-24 ENCOUNTER — Telehealth: Payer: Self-pay | Admitting: Internal Medicine

## 2016-06-24 VITALS — BP 141/61 | HR 60 | Temp 98.0°F | Resp 18 | Ht 67.0 in | Wt 184.3 lb

## 2016-06-24 DIAGNOSIS — Z85118 Personal history of other malignant neoplasm of bronchus and lung: Secondary | ICD-10-CM

## 2016-06-24 DIAGNOSIS — C3492 Malignant neoplasm of unspecified part of left bronchus or lung: Secondary | ICD-10-CM

## 2016-06-24 NOTE — Telephone Encounter (Signed)
Gave pt cal & avs °

## 2016-06-24 NOTE — Progress Notes (Signed)
[facility] [phone]   [phone]  OFFICE PROGRESS NOTE  Olin Hauser, MD [place] [address]  PRINCIPAL DIAGNOSIS: Stage IIB (T3 N0 M0) non-small cell lung cancer consistent with squamous cell carcinoma diagnosed in March of 2012.   PRIOR THERAPY:  1. Status post concurrent chemoradiation with weekly carboplatin and paclitaxel, last dose was given 04/08/2011.  2. Status post left lower lobectomy with mediastinal lymph node dissection under the care of Dr. Roxan Hockey on 06/11/2011 with no evidence for residual disease.  CURRENT THERAPY: Observation.  INTERVAL HISTORY: Rodney Christensen 75 y.o. male returns to the clinic today for annual followup visit accompanied by his wife. He has been observation for the last 5 years. No significant complaints or new medical issues since his last visit he ago. The patient is feeling fine today with no specific complaints. He denied having any significant chest pain, shortness of breath, cough or hemoptysis. He denied having any significant weight loss or night sweats. He has repeat CT scan of the chest performed recently and he is here for evaluation and discussion of his scan results.  MEDICAL HISTORY: Past Medical History:  Diagnosis Date  . BPH (benign prostatic hyperplasia)   . CAD, NATIVE VESSEL 03/22/2010  . 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  . Diverticulosis   . History of radiation therapy 03/18/11 to 04/19/11   lower left lung  . HYPERLIPIDEMIA-MIXED 03/17/2009  . HYPERTENSION, UNSPECIFIED 03/17/2009  . Macular degeneration   . Skin cancer    melanoma-nose  . 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  MEDICATIONS:  Current Outpatient Prescriptions  Medication Sig Dispense Refill  . amLODipine (NORVASC) 10 MG tablet Take 10 mg by mouth daily.    . Ascorbic Acid (VITAMIN C) 500 MG tablet Take 500 mg by  mouth daily.     . B Complex Vitamins (B COMPLEX 100 PO) Take by mouth daily.      . beta carotene 25000 UNIT capsule Take 25,000 Units by mouth daily.      . Bilberry 500 MG CAPS Take 500 mg by mouth daily.     . bimatoprost (LUMIGAN) 0.03 % ophthalmic drops Place 1 drop into both eyes at bedtime.      . Calcium-Magnesium-Zinc (CAL-MAG-ZINC PO) Take by mouth as directed.      . Chelated Zinc 50 MG TABS Take by mouth daily.      . Cinnamon 500 MG capsule Take 1,000 mg by mouth daily.     . Coenzyme Q10 (CO Q 10) 100 MG CAPS Take by mouth daily.      . dorzolamide (TRUSOPT) 2 % ophthalmic solution Place 1 drop into both eyes 2 (two) times daily as needed.    . dorzolamide-timolol (COSOPT) 22.3-6.8 MG/ML ophthalmic solution Place 1 drop into the left eye at bedtime.  5  . doxazosin (CARDURA) 8 MG tablet Take 8 mg by mouth at bedtime.      . fish oil-omega-3 fatty acids 1000 MG capsule Take 2 g by mouth 3 (three) times daily.      . folic acid (FOLVITE) 1 MG tablet Take 1 mg by mouth daily.      . Garlic 8676 MG CAPS Take by mouth daily.      . Ginkgo Biloba (GINKOBA) 40 MG TABS Take by mouth daily.      Marland Kitchen lisinopril (  PRINIVIL,ZESTRIL) 40 MG tablet Take 40 mg by mouth 2 (two) times daily.     Marland Kitchen LUMIGAN 0.01 % SOLN Place 1 drop into the left eye at bedtime.  5  . LUTEIN PO Take by mouth 2 (two) times daily.      . Melatonin 1 MG CAPS Take by mouth at bedtime.      . metoprolol (LOPRESSOR) 50 MG tablet Take 50 mg by mouth 2 (two) times daily.      . Multiple Vitamin (MULTIVITAMIN) tablet Take 1 tablet by mouth daily.      . potassium chloride SA (K-DUR,KLOR-CON) 20 MEQ tablet     . simvastatin (ZOCOR) 20 MG tablet Take 20 mg by mouth every evening.    . torsemide (DEMADEX) 20 MG tablet 20 mg daily.      No current facility-administered medications for this visit.     SURGICAL HISTORY:  Past Surgical History:  Procedure Laterality Date  . BRONCHOSCOPY  06/11/11   [facility]  . CAROTID  ENDARTERECTOMY    . CORONARY ARTERY BYPASS GRAFT  10/10/2004   x5  . Lt VATS,Lt thoacotomy,Lt lower lobectomy with  mediastinal node  dissection  06/11/11   [facility]    REVIEW OF SYSTEMS:  A comprehensive review of systems was negative.   PHYSICAL EXAMINATION: General appearance: alert, cooperative and no distress Head: Normocephalic, without obvious abnormality, atraumatic Neck: no adenopathy Lymph nodes: Cervical, supraclavicular, and axillary nodes normal. Resp: clear to auscultation bilaterally Cardio: regular rate and rhythm, S1, S2 normal, no murmur, click, rub or gallop GI: soft, non-tender; bowel sounds normal; no masses,  no organomegaly Extremities: extremities normal, atraumatic, no cyanosis or edema  ECOG PERFORMANCE STATUS: 1 - Symptomatic but completely ambulatory  Blood pressure (!) 141/61, pulse 60, temperature 98 F (36.7 C), temperature source Oral, resp. rate 18, height '5\' 7"'$  (1.702 m), weight 83.6 kg (184 lb 4.8 oz), SpO2 99 %.  LABORATORY DATA: Lab Results  Component Value Date   WBC 5.5 06/17/2016   HGB 13.8 06/17/2016   HCT 40.5 06/17/2016   MCV 90.6 06/17/2016   PLT 128 (L) 06/17/2016      Chemistry      Component Value Date/Time   NA 140 06/17/2016 0921   K 4.1 06/17/2016 0921   CL 102 05/17/2013 0754   CO2 27 06/17/2016 0921   BUN 10.0 06/17/2016 0921   CREATININE 1.0 06/17/2016 0921      Component Value Date/Time   CALCIUM 9.4 06/17/2016 0921   ALKPHOS 62 06/17/2016 0921   AST 18 06/17/2016 0921   ALT 20 06/17/2016 0921   BILITOT 1.08 06/17/2016 0921       RADIOGRAPHIC STUDIES: Ct Chest W Contrast  Result Date: 06/17/2016 CLINICAL DATA:  Restaging lung cancer EXAM: CT CHEST WITH CONTRAST TECHNIQUE: Multidetector CT imaging of the chest was performed during intravenous contrast administration. CONTRAST:  75 cc of Isovue COMPARISON:  06/19/2015 FINDINGS: Mediastinum/Lymph Nodes: The heart size appears normal. Previous median  sternotomy and CABG procedure. Aortic atherosclerosis noted. No mediastinal or hilar adenopathy. Lungs/Pleura: There is a small loculated left pleural effusion which is similar in volume to the previous examination. There is perihilar consolidation and bronchiectasis involving the left midlung compatible with changes of external beam radiation. Nodule within the right upper lobe measures 3 mm, image 40 of series 5. Unchanged from previous exam. Upper abdomen: Normal appearance of the adrenal glands. Normal appearance of the liver. Stones are identified within the gallbladder which measures up  to 8 mm. The gallbladder appears normal. Unremarkable appearance of the spleen. The adrenal glands are both normal. Unremarkable appearance of the visualized portions of the kidneys. Musculoskeletal: Spondylosis identified within the thoracic spine. There is no aggressive lytic or sclerotic bone lesion. IMPRESSION: 1. Postoperative change of left lower lobectomy with radiation changes in the left perihilar region and small loculated left pleural effusion, stable. No evidence of metastatic disease. 2. Gallstones. Electronically Signed   By: Kerby Moors M.D.   On: 06/17/2016 12:07   ASSESSMENT AND PLAN: This is a very pleasant 75 years old white male with history of stage IIB non-small cell lung cancer status post concurrent chemoradiation followed by left lower lobectomy and mediastinal lymph node dissection with no evidence for residual disease. The patient has been observation since July of 2012 with no evidence for disease recurrence. The recent CT scan of the chest showed no concerning findings for disease recurrence. I discussed the scan results with the patient and his wife. For hypertension, the patient was advised to reconsult with his primary care physician for adjustment of his medication. I recommended for him to continue on observation with follow-up visit and repeat CT scan of the chest in one year.  He  was advised to call immediately she has any concerning symptoms in the interval.  All questions were answered. The patient knows to call the clinic with any problems, questions or concerns. We can certainly see the patient much sooner if necessary.  Disclaimer: This note was dictated with voice recognition software. Similar sounding words can inadvertently be transcribed and may not be corrected upon review.

## 2016-07-09 ENCOUNTER — Ambulatory Visit: Payer: Medicare Other | Admitting: Thoracic Surgery (Cardiothoracic Vascular Surgery)

## 2016-07-23 ENCOUNTER — Ambulatory Visit (INDEPENDENT_AMBULATORY_CARE_PROVIDER_SITE_OTHER): Payer: Medicare Other | Admitting: Thoracic Surgery (Cardiothoracic Vascular Surgery)

## 2016-07-23 VITALS — BP 99/60 | HR 64 | Resp 20 | Ht 67.0 in | Wt 187.0 lb

## 2016-07-23 DIAGNOSIS — C4490 Unspecified malignant neoplasm of skin, unspecified: Secondary | ICD-10-CM | POA: Diagnosis not present

## 2016-07-23 DIAGNOSIS — Z85118 Personal history of other malignant neoplasm of bronchus and lung: Secondary | ICD-10-CM

## 2016-07-23 NOTE — Progress Notes (Signed)
[address]       [place] [address]             [phone]       HPI: [name] is today for a scheduled 5 year follow-up visit.  Rodney Christensen is a 75 year old man who was diagnosed with stage IIb non-small cell carcinoma back in 2012. He had neoadjuvant chemoradiation followed by a left lower lobectomy in 2012. I last saw him in the office in August 2016. He was doing well at that time with no evidence recurrent disease.  Today he says he is not having any significant new problems. "Just getting older." He gets short of breath with heavy exertion, that has not changed recently. His appetite is good. He has not had any significant weight loss. Not had any unusual headaches or visual changes.  Past Medical History:  Diagnosis Date  . BPH (benign prostatic hyperplasia)   . CAD, NATIVE VESSEL 03/22/2010  . 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  . Diverticulosis   . History of radiation therapy 03/18/11 to 04/19/11   lower left lung  . HYPERLIPIDEMIA-MIXED 03/17/2009  . HYPERTENSION, UNSPECIFIED 03/17/2009  . Macular degeneration   . Skin cancer    melanoma-nose  . 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 amLODipine (NORVASC) 10 MG tablet Take 10 mg by mouth daily.    . Ascorbic Acid (VITAMIN C) 500 MG tablet Take 500 mg by mouth daily.     . B Complex Vitamins (B COMPLEX 100 PO) Take by mouth daily.      . beta carotene 25000 UNIT capsule Take 25,000 Units by mouth daily.      . Bilberry 500 MG CAPS Take 500 mg by mouth daily.     . bimatoprost (LUMIGAN) 0.03 % ophthalmic drops Place 1 drop into both eyes at bedtime.      . Calcium-Magnesium-Zinc (CAL-MAG-ZINC PO) Take by mouth as directed.      . Chelated Zinc 50 MG TABS Take by mouth daily.      . Cinnamon 500 MG capsule Take 1,000 mg by mouth daily.     . Coenzyme Q10 (CO Q 10) 100 MG CAPS Take by mouth daily.      .  dorzolamide (TRUSOPT) 2 % ophthalmic solution Place 1 drop into both eyes 2 (two) times daily as needed.    . dorzolamide-timolol (COSOPT) 22.3-6.8 MG/ML ophthalmic solution Place 1 drop into the left eye at bedtime.  5  . doxazosin (CARDURA) 8 MG tablet Take 8 mg by mouth at bedtime.      . fish oil-omega-3 fatty acids 1000 MG capsule Take 2 g by mouth 3 (three) times daily.      . folic acid (FOLVITE) 1 MG tablet Take 1 mg by mouth daily.      . Garlic 2440 MG CAPS Take by mouth daily.      . Ginkgo Biloba (GINKOBA) 40 MG TABS Take by mouth daily.      Marland Kitchen lisinopril (PRINIVIL,ZESTRIL) 40 MG tablet Take 40 mg by mouth 2 (two) times daily.     . LUTEIN PO Take by mouth 2 (two) times daily.      . Melatonin 1 MG CAPS Take by mouth at bedtime.      . metoprolol (LOPRESSOR) 50 MG tablet Take 50 mg by mouth 2 (two) times  daily.      . Multiple Vitamin (MULTIVITAMIN) tablet Take 1 tablet by mouth daily.      . potassium chloride SA (K-DUR,KLOR-CON) 20 MEQ tablet     . simvastatin (ZOCOR) 20 MG tablet Take 20 mg by mouth every evening.    . torsemide (DEMADEX) 20 MG tablet 20 mg daily.      No current facility-administered medications for this visit.     Physical Exam BP 99/60   Pulse 64   Resp 20   Ht '5\' 7"'$  (1.702 m)   Wt 187 lb (84.8 kg)   SpO2 98% Comment: RA  BMI 29.6 kg/m  75 year old man in no acute distress Alert and oriented 3 with no focal deficits Well-nourished Cardiac regular rate and rhythm normal S1 and S2 Lungs diminished breath sounds at left base, otherwise clear Incisions well healed No Cervical or supraclavicular adenopathy  Diagnostic Tests: CT CHEST WITH CONTRAST  TECHNIQUE: Multidetector CT imaging of the chest was performed during intravenous contrast administration.  CONTRAST:  75 cc of Isovue  COMPARISON:  06/19/2015  FINDINGS: Mediastinum/Lymph Nodes: The heart size appears normal. Previous median sternotomy and CABG procedure. Aortic  atherosclerosis noted. No mediastinal or hilar adenopathy.  Lungs/Pleura: There is a small loculated left pleural effusion which is similar in volume to the previous examination. There is perihilar consolidation and bronchiectasis involving the left midlung compatible with changes of external beam radiation. Nodule within the right upper lobe measures 3 mm, image 40 of series 5. Unchanged from previous exam.  Upper abdomen: Normal appearance of the adrenal glands. Normal appearance of the liver. Stones are identified within the gallbladder which measures up to 8 mm. The gallbladder appears normal. Unremarkable appearance of the spleen. The adrenal glands are both normal. Unremarkable appearance of the visualized portions of the kidneys.  Musculoskeletal: Spondylosis identified within the thoracic spine. There is no aggressive lytic or sclerotic bone lesion.  IMPRESSION: 1. Postoperative change of left lower lobectomy with radiation changes in the left perihilar region and small loculated left pleural effusion, stable. No evidence of metastatic disease. 2. Gallstones.   Electronically Signed   By: Kerby Moors M.D.   On: 06/17/2016 12:07  I personally reviewed the CT chest from July and concur with findings as noted above.  Impression: 75 year old man who is now 5 years out from a left lower lobectomy following neoadjuvant chemoradiation for a stage IIb (T3, N0) non-small cell carcinoma. He has no evidence of recurrent disease.  He saw Dr. Julien Nordmann recently. He plans to continue to do annual CTs.   CAD- no current anginal symptoms.  Plan: Follow-up with Dr. Julien Nordmann  I am going to release [name] at this time. I will be happy to see him back any time in the future if I can be of any further assistance with his care.  Melrose Nakayama, MD [facility] [phone]

## 2017-06-20 ENCOUNTER — Encounter (HOSPITAL_COMMUNITY): Payer: Self-pay

## 2017-06-20 ENCOUNTER — Ambulatory Visit (HOSPITAL_COMMUNITY)
Admission: RE | Admit: 2017-06-20 | Discharge: 2017-06-20 | Disposition: A | Discharge status: Home / Self Care | Payer: Medicare Other | Source: Ambulatory Visit | Attending: Internal Medicine | Admitting: Internal Medicine

## 2017-06-20 ENCOUNTER — Other Ambulatory Visit (HOSPITAL_BASED_OUTPATIENT_CLINIC_OR_DEPARTMENT_OTHER): Payer: Medicare Other

## 2017-06-20 DIAGNOSIS — I700 Atherosclerosis of aorta: Secondary | ICD-10-CM | POA: Diagnosis not present

## 2017-06-20 DIAGNOSIS — Z951 Presence of aortocoronary bypass graft: Secondary | ICD-10-CM | POA: Insufficient documentation

## 2017-06-20 DIAGNOSIS — C3492 Malignant neoplasm of unspecified part of left bronchus or lung: Secondary | ICD-10-CM

## 2017-06-20 DIAGNOSIS — J479 Bronchiectasis, uncomplicated: Secondary | ICD-10-CM | POA: Diagnosis not present

## 2017-06-20 DIAGNOSIS — K8020 Calculus of gallbladder without cholecystitis without obstruction: Secondary | ICD-10-CM | POA: Diagnosis not present

## 2017-06-20 DIAGNOSIS — Z85118 Personal history of other malignant neoplasm of bronchus and lung: Secondary | ICD-10-CM

## 2017-06-20 DIAGNOSIS — Z902 Acquired absence of lung [part of]: Secondary | ICD-10-CM | POA: Insufficient documentation

## 2017-06-20 DIAGNOSIS — Z923 Personal history of irradiation: Secondary | ICD-10-CM | POA: Insufficient documentation

## 2017-06-20 DIAGNOSIS — K449 Diaphragmatic hernia without obstruction or gangrene: Secondary | ICD-10-CM | POA: Diagnosis not present

## 2017-06-20 LAB — CBC WITH DIFFERENTIAL/PLATELET
BASO%: 0.7 % (ref 0.0–2.0)
Basophils Absolute: 0 10*3/uL (ref 0.0–0.1)
EOS%: 3 % (ref 0.0–7.0)
Eosinophils Absolute: 0.2 10*3/uL (ref 0.0–0.5)
HEMATOCRIT: 38.9 % (ref 38.4–49.9)
HEMOGLOBIN: 13.4 g/dL (ref 13.0–17.1)
LYMPH#: 1 10*3/uL (ref 0.9–3.3)
LYMPH%: 17.6 % (ref 14.0–49.0)
MCH: 31.3 pg (ref 27.2–33.4)
MCHC: 34.5 g/dL (ref 32.0–36.0)
MCV: 90.6 fL (ref 79.3–98.0)
MONO#: 0.5 10*3/uL (ref 0.1–0.9)
MONO%: 7.8 % (ref 0.0–14.0)
NEUT#: 4.2 10*3/uL (ref 1.5–6.5)
NEUT%: 70.9 % (ref 39.0–75.0)
Platelets: 140 10*3/uL (ref 140–400)
RBC: 4.3 10*6/uL (ref 4.20–5.82)
RDW: 13.3 % (ref 11.0–14.6)
WBC: 5.9 10*3/uL (ref 4.0–10.3)

## 2017-06-20 LAB — COMPREHENSIVE METABOLIC PANEL
ALBUMIN: 4 g/dL (ref 3.5–5.0)
ALK PHOS: 69 U/L (ref 40–150)
ALT: 13 U/L (ref 0–55)
ANION GAP: 9 meq/L (ref 3–11)
AST: 16 U/L (ref 5–34)
BUN: 10.3 mg/dL (ref 7.0–26.0)
CALCIUM: 9.6 mg/dL (ref 8.4–10.4)
CO2: 26 mEq/L (ref 22–29)
CREATININE: 1.2 mg/dL (ref 0.7–1.3)
Chloride: 104 mEq/L (ref 98–109)
EGFR: 57 mL/min/{1.73_m2} — ABNORMAL LOW (ref >90)
Glucose: 101 mg/dl (ref 70–140)
Potassium: 3.7 mEq/L (ref 3.5–5.1)
Sodium: 139 mEq/L (ref 136–145)
Total Bilirubin: 0.76 mg/dL (ref 0.20–1.20)
Total Protein: 7.3 g/dL (ref 6.4–8.3)

## 2017-06-20 MED ORDER — IOPAMIDOL (ISOVUE-300) INJECTION 61%
75.0000 mL | Freq: Once | INTRAVENOUS | Status: AC | PRN
  Administered 2017-06-20: 75 mL via INTRAVENOUS

## 2017-06-20 MED ORDER — IOPAMIDOL (ISOVUE-300) INJECTION 61%
INTRAVENOUS | Status: AC
  Administered 2017-06-20: 75 mL via INTRAVENOUS
  Filled 2017-06-20: qty 75

## 2017-06-24 ENCOUNTER — Ambulatory Visit (HOSPITAL_BASED_OUTPATIENT_CLINIC_OR_DEPARTMENT_OTHER): Payer: Medicare Other | Admitting: Internal Medicine

## 2017-06-24 ENCOUNTER — Encounter: Payer: Self-pay | Admitting: Internal Medicine

## 2017-06-24 VITALS — BP 135/60 | HR 63 | Temp 97.9°F | Resp 18 | Ht 67.0 in | Wt 179.7 lb

## 2017-06-24 DIAGNOSIS — Z85118 Personal history of other malignant neoplasm of bronchus and lung: Secondary | ICD-10-CM | POA: Diagnosis not present

## 2017-06-24 DIAGNOSIS — C3492 Malignant neoplasm of unspecified part of left bronchus or lung: Secondary | ICD-10-CM

## 2017-06-24 NOTE — Progress Notes (Signed)
[place] Telephone:(336) [phone]   Fax:(336) [phone]  OFFICE PROGRESS NOTE  Olin Hauser, MD [place] [address]  PRINCIPAL DIAGNOSIS: Stage IIB (T3 N0 M0) non-small cell lung cancer consistent with squamous cell carcinoma diagnosed in March of 2012.   PRIOR THERAPY:  1. Status post concurrent chemoradiation with weekly carboplatin and paclitaxel, last dose was given 04/08/2011.  2. Status post left lower lobectomy with mediastinal lymph node dissection under the care of Dr. Roxan Hockey on 06/11/2011 with no evidence for residual disease.  CURRENT THERAPY: Observation.  INTERVAL HISTORY: Rodney Christensen 76 y.o. male follow-up visit accompanied by his wife. He is currently undergoing local chemotherapy for skin lesion on the scalp with 5-FU. He denied having any recent weight loss or night sweats. He has no nausea, vomiting, diarrhea or constipation. He has no fever or chills. The patient denied having any chest pain, shortness breath, cough or hemoptysis. He had repeat CT scan of the chest performed recently and he is here for evaluation and discussion of his scan results.   MEDICAL HISTORY: Past Medical History:  Diagnosis Date  . BPH (benign prostatic hyperplasia)   . CAD, NATIVE VESSEL 03/22/2010  . 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  . Diverticulosis   . History of radiation therapy 03/18/11 to 04/19/11   lower left lung  . HYPERLIPIDEMIA-MIXED 03/17/2009  . HYPERTENSION, UNSPECIFIED 03/17/2009  . Macular degeneration   . Skin cancer    melanoma-nose  . 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  MEDICATIONS:  Current Outpatient Prescriptions  Medication Sig Dispense Refill  . amLODipine (NORVASC) 10 MG tablet Take 10 mg by mouth daily.    . Ascorbic Acid (VITAMIN C) 500 MG tablet Take 500 mg by mouth daily.     . B Complex Vitamins (B COMPLEX 100 PO) Take by  mouth daily.      . beta carotene 25000 UNIT capsule Take 25,000 Units by mouth daily.      . Bilberry 500 MG CAPS Take 500 mg by mouth daily.     . bimatoprost (LUMIGAN) 0.03 % ophthalmic drops Place 1 drop into both eyes at bedtime.      . Calcium-Magnesium-Zinc (CAL-MAG-ZINC PO) Take by mouth as directed.      . Chelated Zinc 50 MG TABS Take by mouth daily.      . Cinnamon 500 MG capsule Take 1,000 mg by mouth daily.     . Coenzyme Q10 (CO Q 10) 100 MG CAPS Take by mouth daily.      . dorzolamide (TRUSOPT) 2 % ophthalmic solution Place 1 drop into both eyes 2 (two) times daily as needed.    . dorzolamide-timolol (COSOPT) 22.3-6.8 MG/ML ophthalmic solution Place 1 drop into the left eye at bedtime.  5  . doxazosin (CARDURA) 8 MG tablet Take 8 mg by mouth at bedtime.      . fish oil-omega-3 fatty acids 1000 MG capsule Take 2 g by mouth 3 (three) times daily.      . folic acid (FOLVITE) 1 MG tablet Take 1 mg by mouth daily.      . Garlic 1941 MG CAPS Take by mouth daily.      . Ginkgo Biloba (GINKOBA) 40 MG TABS Take by mouth daily.      Marland Kitchen lisinopril (PRINIVIL,ZESTRIL) 40 MG tablet Take 40 mg by mouth 2 (  two) times daily.     . LUTEIN PO Take by mouth 2 (two) times daily.      . Melatonin 1 MG CAPS Take by mouth at bedtime.      . metoprolol (LOPRESSOR) 50 MG tablet Take 50 mg by mouth 2 (two) times daily.      . Multiple Vitamin (MULTIVITAMIN) tablet Take 1 tablet by mouth daily.      . potassium chloride SA (K-DUR,KLOR-CON) 20 MEQ tablet     . simvastatin (ZOCOR) 20 MG tablet Take 20 mg by mouth every evening.    . torsemide (DEMADEX) 20 MG tablet 20 mg daily.      No current facility-administered medications for this visit.     SURGICAL HISTORY:  Past Surgical History:  Procedure Laterality Date  . BRONCHOSCOPY  06/11/11   [facility]  . CAROTID ENDARTERECTOMY    . CORONARY ARTERY BYPASS GRAFT  10/10/2004   x5  . Lt VATS,Lt thoacotomy,Lt lower lobectomy with  mediastinal node   dissection  06/11/11   [facility]    REVIEW OF SYSTEMS:  A comprehensive review of systems was negative.   PHYSICAL EXAMINATION: General appearance: alert, cooperative and no distress Head: Normocephalic, without obvious abnormality, atraumatic Neck: no adenopathy Lymph nodes: Cervical, supraclavicular, and axillary nodes normal. Resp: clear to auscultation bilaterally Back: symmetric, no curvature. ROM normal. No CVA tenderness. Cardio: regular rate and rhythm, S1, S2 normal, no murmur, click, rub or gallop GI: soft, non-tender; bowel sounds normal; no masses,  no organomegaly Extremities: extremities normal, atraumatic, no cyanosis or edema  ECOG PERFORMANCE STATUS: 1 - Symptomatic but completely ambulatory  Blood pressure 135/60, pulse 63, temperature 97.9 F (36.6 C), temperature source Oral, resp. rate 18, height 5\' 7"  (1.702 m), weight 179 lb 11.2 oz (81.5 kg), SpO2 98 %.  LABORATORY DATA: Lab Results  Component Value Date   WBC 5.9 06/20/2017   HGB 13.4 06/20/2017   HCT 38.9 06/20/2017   MCV 90.6 06/20/2017   PLT 140 06/20/2017      Chemistry      Component Value Date/Time   NA 139 06/20/2017 0737   K 3.7 06/20/2017 0737   CL 102 05/17/2013 0754   CO2 26 06/20/2017 0737   BUN 10.3 06/20/2017 0737   CREATININE 1.2 06/20/2017 0737      Component Value Date/Time   CALCIUM 9.6 06/20/2017 0737   ALKPHOS 69 06/20/2017 0737   AST 16 06/20/2017 0737   ALT 13 06/20/2017 0737   BILITOT 0.76 06/20/2017 0737       RADIOGRAPHIC STUDIES: Ct Chest W Contrast  Result Date: 06/20/2017 CLINICAL DATA:  Lung cancer diagnosed in 2012. Chemotherapy and radiation therapy completed. Previous left lower lobectomy. No current complaints. EXAM: CT CHEST WITH CONTRAST TECHNIQUE: Multidetector CT imaging of the chest was performed during intravenous contrast administration. CONTRAST:  75 ml Isovue-300. COMPARISON:  CT 06/19/2015 and 06/17/2016. FINDINGS: Cardiovascular: There is  atherosclerosis of the aorta, great vessels and coronary arteries. There is mild proximal stenosis of the left common carotid artery. No acute vascular findings are seen. The heart size normal post median sternotomy and CABG. There is no pericardial effusion. Mediastinum/Nodes: There are no enlarged mediastinal, hilar or axillary lymph nodes.An 8 mm AP window node on image 64 is similar to the previous study. Small hiatal hernia noted. Lungs/Pleura: Status post left lower lobectomy. A small amount of loculated pleural fluid posteriorly in the lower left hemithorax appears unchanged. There is no significant pleural fluid on  the right. The lungs appear stable. There is chronic perihilar scarring and bronchiectasis on the left. There is stable mild biapical scarring and subpleural nodularity in the right upper lobe (images 23 and 41). No new or enlarging pulmonary nodules. Upper abdomen: No acute findings are seen within the visualized upper abdomen. There are small calcified gallstones. Musculoskeletal/Chest wall: There is no chest wall mass or suspicious osseous finding. IMPRESSION: 1. Stable chest CT. No evidence of local recurrence or metastatic disease. 2. Stable radiation changes in the lower left hemithorax with associated loculated pleural fluid. 3.  Aortic Atherosclerosis (ICD10-I70.0). 4. Cholelithiasis. Electronically Signed   By: Richardean Sale M.D.   On: 06/20/2017 12:05   ASSESSMENT AND PLAN: This is a very pleasant 76 years old white male with history of stage IIB non-small cell lung cancer status post concurrent chemoradiation followed by left lower lobectomy and mediastinal lymph node dissection with no evidence for residual disease. The patient has been observation since July of 2012 with no evidence for disease recurrence. He had repeat CT scan of the chest performed recently. His scan showed no evidence for disease recurrence. I discussed the scan results with the patient and his wife. I  recommended for him to continue on observation with repeat CT scan of the chest in one year. The patient was advised to call immediately if he has any concerning symptoms in the interval. All questions were answered. The patient knows to call the clinic with any problems, questions or concerns. We can certainly see the patient much sooner if necessary.  Disclaimer: This note was dictated with voice recognition software. Similar sounding words can inadvertently be transcribed and may not be corrected upon review.

## 2017-06-25 ENCOUNTER — Telehealth: Payer: Self-pay | Admitting: Internal Medicine

## 2017-06-25 NOTE — Telephone Encounter (Signed)
Spoke with wife and confirmed appointments for 06/2018

## 2018-06-19 ENCOUNTER — Inpatient Hospital Stay: Payer: Medicare Other | Attending: Internal Medicine

## 2018-06-19 DIAGNOSIS — Z85118 Personal history of other malignant neoplasm of bronchus and lung: Secondary | ICD-10-CM | POA: Diagnosis not present

## 2018-06-19 DIAGNOSIS — Z9221 Personal history of antineoplastic chemotherapy: Secondary | ICD-10-CM | POA: Insufficient documentation

## 2018-06-19 DIAGNOSIS — C3492 Malignant neoplasm of unspecified part of left bronchus or lung: Secondary | ICD-10-CM

## 2018-06-19 DIAGNOSIS — Z923 Personal history of irradiation: Secondary | ICD-10-CM | POA: Diagnosis not present

## 2018-06-19 LAB — CBC WITH DIFFERENTIAL/PLATELET
BASOS ABS: 0 10*3/uL (ref 0.0–0.1)
Basophils Relative: 1 %
EOS PCT: 3 %
Eosinophils Absolute: 0.2 10*3/uL (ref 0.0–0.5)
HCT: 37.2 % — ABNORMAL LOW (ref 38.4–49.9)
Hemoglobin: 12.6 g/dL — ABNORMAL LOW (ref 13.0–17.1)
LYMPHS ABS: 1.1 10*3/uL (ref 0.9–3.3)
LYMPHS PCT: 21 %
MCH: 31.1 pg (ref 27.2–33.4)
MCHC: 33.9 g/dL (ref 32.0–36.0)
MCV: 91.6 fL (ref 79.3–98.0)
Monocytes Absolute: 0.4 10*3/uL (ref 0.1–0.9)
Monocytes Relative: 8 %
NEUTROS PCT: 67 %
Neutro Abs: 3.7 10*3/uL (ref 1.5–6.5)
PLATELETS: 123 10*3/uL — AB (ref 140–400)
RBC: 4.06 MIL/uL — AB (ref 4.20–5.82)
RDW: 13.3 % (ref 11.0–14.6)
WBC: 5.4 10*3/uL (ref 4.0–10.3)

## 2018-06-19 LAB — COMPREHENSIVE METABOLIC PANEL
ALT: 14 U/L (ref 0–44)
AST: 14 U/L — AB (ref 15–41)
Albumin: 3.9 g/dL (ref 3.5–5.0)
Alkaline Phosphatase: 60 U/L (ref 38–126)
Anion gap: 8 (ref 5–15)
BUN: 7 mg/dL — AB (ref 8–23)
CHLORIDE: 101 mmol/L (ref 98–111)
CO2: 26 mmol/L (ref 22–32)
CREATININE: 0.91 mg/dL (ref 0.61–1.24)
Calcium: 9.2 mg/dL (ref 8.9–10.3)
GFR calc Af Amer: >60 mL/min (ref >60)
Glucose, Bld: 102 mg/dL — ABNORMAL HIGH (ref 70–99)
Potassium: 3.6 mmol/L (ref 3.5–5.1)
Sodium: 135 mmol/L (ref 135–145)
Total Bilirubin: 0.6 mg/dL (ref 0.3–1.2)
Total Protein: 6.9 g/dL (ref 6.5–8.1)

## 2018-06-22 ENCOUNTER — Encounter (HOSPITAL_COMMUNITY): Payer: Self-pay

## 2018-06-22 ENCOUNTER — Ambulatory Visit (HOSPITAL_COMMUNITY)
Admission: RE | Admit: 2018-06-22 | Discharge: 2018-06-22 | Disposition: A | Discharge status: Home / Self Care | Payer: Medicare Other | Source: Ambulatory Visit | Attending: Internal Medicine | Admitting: Internal Medicine

## 2018-06-22 DIAGNOSIS — C3492 Malignant neoplasm of unspecified part of left bronchus or lung: Secondary | ICD-10-CM | POA: Diagnosis not present

## 2018-06-22 DIAGNOSIS — J439 Emphysema, unspecified: Secondary | ICD-10-CM | POA: Diagnosis not present

## 2018-06-22 DIAGNOSIS — K8020 Calculus of gallbladder without cholecystitis without obstruction: Secondary | ICD-10-CM | POA: Diagnosis not present

## 2018-06-22 DIAGNOSIS — I700 Atherosclerosis of aorta: Secondary | ICD-10-CM | POA: Diagnosis not present

## 2018-06-22 DIAGNOSIS — J90 Pleural effusion, not elsewhere classified: Secondary | ICD-10-CM | POA: Diagnosis not present

## 2018-06-22 MED ORDER — IOHEXOL 300 MG/ML  SOLN
75.0000 mL | Freq: Once | INTRAMUSCULAR | Status: AC | PRN
  Administered 2018-06-22: 75 mL via INTRAVENOUS

## 2018-06-23 ENCOUNTER — Inpatient Hospital Stay (HOSPITAL_BASED_OUTPATIENT_CLINIC_OR_DEPARTMENT_OTHER): Payer: Medicare Other | Admitting: Internal Medicine

## 2018-06-23 ENCOUNTER — Encounter: Payer: Self-pay | Admitting: Internal Medicine

## 2018-06-23 ENCOUNTER — Telehealth: Payer: Self-pay

## 2018-06-23 VITALS — BP 128/65 | HR 60 | Temp 98.2°F | Resp 18 | Ht 67.0 in | Wt 173.4 lb

## 2018-06-23 DIAGNOSIS — Z923 Personal history of irradiation: Secondary | ICD-10-CM | POA: Diagnosis not present

## 2018-06-23 DIAGNOSIS — Z85118 Personal history of other malignant neoplasm of bronchus and lung: Secondary | ICD-10-CM

## 2018-06-23 DIAGNOSIS — C3490 Malignant neoplasm of unspecified part of unspecified bronchus or lung: Secondary | ICD-10-CM

## 2018-06-23 DIAGNOSIS — C3492 Malignant neoplasm of unspecified part of left bronchus or lung: Secondary | ICD-10-CM

## 2018-06-23 DIAGNOSIS — Z9221 Personal history of antineoplastic chemotherapy: Secondary | ICD-10-CM

## 2018-06-23 DIAGNOSIS — C4490 Unspecified malignant neoplasm of skin, unspecified: Secondary | ICD-10-CM

## 2018-06-23 NOTE — Telephone Encounter (Signed)
Printed avs and calender of upcoming appointment. Per 7/23 los

## 2018-06-23 NOTE — Progress Notes (Signed)
[facility] Telephone:(336) [phone]   Fax:(336) [phone]  OFFICE PROGRESS NOTE  [name], MD [place] [address]  PRINCIPAL DIAGNOSIS: Stage IIB (T3 N0 M0) non-small cell lung cancer consistent with squamous cell carcinoma diagnosed in March of 2012.   PRIOR THERAPY:  1. Status post concurrent chemoradiation with weekly carboplatin and paclitaxel, last dose was given 04/08/2011.  2. Status post left lower lobectomy with mediastinal lymph node dissection under the care of Dr. Roxan Hockey on 06/11/2011 with no evidence for residual disease.  CURRENT THERAPY: Observation.  INTERVAL HISTORY: Rodney Christensen 77 y.o. male returns to the clinic today for annual follow-up visit accompanied by his wife.  The patient is feeling fine today with no specific complaints.  He denied having any current chest pain, shortness of breath, cough or hemoptysis.  He denied having any fever or chills.  He has no nausea, vomiting, diarrhea or constipation.  He denied having any recent weight loss or night sweats.  He has a lot of his skin surgery recently.  The patient had repeat CT scan of the chest performed recently and he is here for evaluation and discussion of his discuss results.   MEDICAL HISTORY: Past Medical History:  Diagnosis Date  . BPH (benign prostatic hyperplasia)   . CAD, NATIVE VESSEL 03/22/2010  . 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  . Diverticulosis   . History of radiation therapy 03/18/11 to 04/19/11   lower left lung  . HYPERLIPIDEMIA-MIXED 03/17/2009  . HYPERTENSION, UNSPECIFIED 03/17/2009  . Macular degeneration   . Skin cancer    melanoma-nose  . 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  MEDICATIONS:  Current Outpatient Medications  Medication Sig Dispense Refill  . amLODipine (NORVASC) 10 MG tablet Take 10 mg by mouth daily.    . Ascorbic Acid (VITAMIN C) 500 MG  tablet Take 500 mg by mouth daily.     . B Complex Vitamins (B COMPLEX 100 PO) Take by mouth daily.      . beta carotene 25000 UNIT capsule Take 25,000 Units by mouth daily.      . Bilberry 500 MG CAPS Take 500 mg by mouth daily.     . bimatoprost (LUMIGAN) 0.03 % ophthalmic drops Place 1 drop into both eyes at bedtime.      . Calcium-Magnesium-Zinc (CAL-MAG-ZINC PO) Take by mouth as directed.      . Chelated Zinc 50 MG TABS Take by mouth daily.      . Cinnamon 500 MG capsule Take 1,000 mg by mouth daily.     . Coenzyme Q10 (CO Q 10) 100 MG CAPS Take by mouth daily.      . dorzolamide (TRUSOPT) 2 % ophthalmic solution Place 1 drop into both eyes 2 (two) times daily as needed.    . dorzolamide-timolol (COSOPT) 22.3-6.8 MG/ML ophthalmic solution Place 1 drop into the left eye at bedtime.  5  . doxazosin (CARDURA) 8 MG tablet Take 8 mg by mouth at bedtime.      . fish oil-omega-3 fatty acids 1000 MG capsule Take 2 g by mouth 3 (three) times daily.      . folic acid (FOLVITE) 1 MG tablet Take 1 mg by mouth daily.      . Garlic 7371 MG CAPS Take by mouth daily.      . Ginkgo Biloba (GINKOBA) 40  MG TABS Take by mouth daily.      Marland Kitchen lisinopril (PRINIVIL,ZESTRIL) 40 MG tablet Take 40 mg by mouth 2 (two) times daily.     . LUTEIN PO Take by mouth 2 (two) times daily.      . Melatonin 1 MG CAPS Take by mouth at bedtime.      . metoprolol (LOPRESSOR) 50 MG tablet Take 50 mg by mouth 2 (two) times daily.      . Multiple Vitamin (MULTIVITAMIN) tablet Take 1 tablet by mouth daily.      . potassium chloride SA (K-DUR,KLOR-CON) 20 MEQ tablet     . simvastatin (ZOCOR) 20 MG tablet Take 20 mg by mouth every evening.    . torsemide (DEMADEX) 20 MG tablet 20 mg daily.      No current facility-administered medications for this visit.     SURGICAL HISTORY:  Past Surgical History:  Procedure Laterality Date  . BRONCHOSCOPY  06/11/11   [facility]  . CAROTID ENDARTERECTOMY    . CORONARY ARTERY BYPASS GRAFT   10/10/2004   x5  . Lt VATS,Lt thoacotomy,Lt lower lobectomy with  mediastinal node  dissection  06/11/11   [facility]    REVIEW OF SYSTEMS:  A comprehensive review of systems was negative.   PHYSICAL EXAMINATION: General appearance: alert, cooperative and no distress Head: Normocephalic, without obvious abnormality, atraumatic Neck: no adenopathy Lymph nodes: Cervical, supraclavicular, and axillary nodes normal. Resp: clear to auscultation bilaterally Back: symmetric, no curvature. ROM normal. No CVA tenderness. Cardio: regular rate and rhythm, S1, S2 normal, no murmur, click, rub or gallop GI: soft, non-tender; bowel sounds normal; no masses,  no organomegaly Extremities: extremities normal, atraumatic, no cyanosis or edema  ECOG PERFORMANCE STATUS: 1 - Symptomatic but completely ambulatory  Blood pressure 128/65, pulse 60, temperature 98.2 F (36.8 C), temperature source Oral, resp. rate 18, height 5\' 7"  (1.702 m), weight 173 lb 6.4 oz (78.7 kg), SpO2 98 %.  LABORATORY DATA: Lab Results  Component Value Date   WBC 5.4 06/19/2018   HGB 12.6 (L) 06/19/2018   HCT 37.2 (L) 06/19/2018   MCV 91.6 06/19/2018   PLT 123 (L) 06/19/2018      Chemistry      Component Value Date/Time   NA 135 06/19/2018 0741   NA 139 06/20/2017 0737   K 3.6 06/19/2018 0741   K 3.7 06/20/2017 0737   CL 101 06/19/2018 0741   CL 102 05/17/2013 0754   CO2 26 06/19/2018 0741   CO2 26 06/20/2017 0737   BUN 7 (L) 06/19/2018 0741   BUN 10.3 06/20/2017 0737   CREATININE 0.91 06/19/2018 0741   CREATININE 1.2 06/20/2017 0737      Component Value Date/Time   CALCIUM 9.2 06/19/2018 0741   CALCIUM 9.6 06/20/2017 0737   ALKPHOS 60 06/19/2018 0741   ALKPHOS 69 06/20/2017 0737   AST 14 (L) 06/19/2018 0741   AST 16 06/20/2017 0737   ALT 14 06/19/2018 0741   ALT 13 06/20/2017 0737   BILITOT 0.6 06/19/2018 0741   BILITOT 0.76 06/20/2017 0737       RADIOGRAPHIC STUDIES: Ct Chest W Contrast  Result  Date: 06/22/2018 CLINICAL DATA:  Squamous cell left lung cancer with left lower lobectomy, chemotherapy and radiation therapy. EXAM: CT CHEST WITH CONTRAST TECHNIQUE: Multidetector CT imaging of the chest was performed during intravenous contrast administration. CONTRAST:  11mL OMNIPAQUE IOHEXOL 300 MG/ML  SOLN COMPARISON:  06/20/2017. FINDINGS: Cardiovascular: Atherosclerotic calcification of the arterial vasculature, including aortic  valve. Heart is at the upper limits of normal in size to mildly enlarged. No pericardial effusion. Mediastinum/Nodes: Mediastinal and hilar by CT lymph nodes size criteria are not enlarged. No axillary adenopathy. Esophagus is grossly unremarkable. Tiny hiatal hernia. Lungs/Pleura: Left lower lobectomy with post radiation collapse/consolidation and bronchiectasis in the posterior left perihilar hemithorax. Small loculated left pleural effusion, stable. Mild centrilobular emphysema. Mild biapical pleuroparenchymal scarring. Lungs are otherwise clear. No right pleural fluid. Airway is otherwise unremarkable. Upper Abdomen: Visualized portion of the liver is unremarkable. Stones are seen in the gallbladder. There may be slight nodular thickening of the right adrenal gland. Visualized portions of left adrenal gland and right kidney are unremarkable. Subcentimeter low-attenuation lesion in the left kidney is too small to characterize. Visualized portions of the spleen, pancreas, stomach and bowel are grossly unremarkable with exception of a tiny hiatal hernia. No upper abdominal adenopathy. Musculoskeletal: Degenerative changes in the spine. No worrisome lytic or sclerotic lesions. IMPRESSION: 1. Postsurgical and post treatment changes in the perihilar left hemithorax, with a small loculated left pleural effusion, stable. No evidence of recurrent or metastatic disease. 2.  Aortic atherosclerosis (ICD10-170.0). 3.  Emphysema (ICD10-J43.9). 4. Cholelithiasis. Electronically Signed   By:  Lorin Picket M.D.   On: 06/22/2018 09:05   ASSESSMENT AND PLAN: This is a very pleasant 77 years old white male with history of stage IIB non-small cell lung cancer status post concurrent chemoradiation followed by left lower lobectomy and mediastinal lymph node dissection with no evidence for residual disease.  The patient continues to do well.  He has been in observation since 2012. Repeat CT scan of the chest showed no concerning findings for disease recurrence or progression. I discussed the scan results with the patient and his wife and recommended for him to continue on observation with repeat CT scan of the chest in 1 year. He was advised to call immediately if he has any concerning symptoms in the interval. All questions were answered. The patient knows to call the clinic with any problems, questions or concerns. We can certainly see the patient much sooner if necessary.  Disclaimer: This note was dictated with voice recognition software. Similar sounding words can inadvertently be transcribed and may not be corrected upon review.

## 2019-06-21 ENCOUNTER — Encounter (HOSPITAL_COMMUNITY): Payer: Self-pay

## 2019-06-21 ENCOUNTER — Other Ambulatory Visit: Payer: Self-pay

## 2019-06-21 ENCOUNTER — Ambulatory Visit (HOSPITAL_COMMUNITY)
Admission: RE | Admit: 2019-06-21 | Discharge: 2019-06-21 | Disposition: A | Discharge status: Home / Self Care | Payer: Medicare Other | Source: Ambulatory Visit | Attending: Internal Medicine | Admitting: Internal Medicine

## 2019-06-21 ENCOUNTER — Inpatient Hospital Stay: Payer: Medicare Other | Attending: Internal Medicine

## 2019-06-21 DIAGNOSIS — C3490 Malignant neoplasm of unspecified part of unspecified bronchus or lung: Secondary | ICD-10-CM | POA: Diagnosis present

## 2019-06-21 DIAGNOSIS — Z923 Personal history of irradiation: Secondary | ICD-10-CM | POA: Insufficient documentation

## 2019-06-21 DIAGNOSIS — Z85118 Personal history of other malignant neoplasm of bronchus and lung: Secondary | ICD-10-CM | POA: Diagnosis present

## 2019-06-21 DIAGNOSIS — Z9221 Personal history of antineoplastic chemotherapy: Secondary | ICD-10-CM | POA: Insufficient documentation

## 2019-06-21 LAB — CMP (CANCER CENTER ONLY)
ALT: 13 U/L (ref 0–44)
AST: 14 U/L — ABNORMAL LOW (ref 15–41)
Albumin: 4.1 g/dL (ref 3.5–5.0)
Alkaline Phosphatase: 59 U/L (ref 38–126)
Anion gap: 9 (ref 5–15)
BUN: 11 mg/dL (ref 8–23)
CO2: 25 mmol/L (ref 22–32)
Calcium: 8.8 mg/dL — ABNORMAL LOW (ref 8.9–10.3)
Chloride: 105 mmol/L (ref 98–111)
Creatinine: 1.03 mg/dL (ref 0.61–1.24)
GFR, Est AFR Am: >60 mL/min (ref >60)
GFR, Estimated: >60 mL/min (ref >60)
Glucose, Bld: 98 mg/dL (ref 70–99)
Potassium: 3.5 mmol/L (ref 3.5–5.1)
Sodium: 139 mmol/L (ref 135–145)
Total Bilirubin: 1 mg/dL (ref 0.3–1.2)
Total Protein: 7 g/dL (ref 6.5–8.1)

## 2019-06-21 LAB — CBC WITH DIFFERENTIAL (CANCER CENTER ONLY)
Abs Immature Granulocytes: 0.02 10*3/uL (ref 0.00–0.07)
Basophils Absolute: 0 10*3/uL (ref 0.0–0.1)
Basophils Relative: 1 %
Eosinophils Absolute: 0.1 10*3/uL (ref 0.0–0.5)
Eosinophils Relative: 3 %
HCT: 37.4 % — ABNORMAL LOW (ref 39.0–52.0)
Hemoglobin: 13 g/dL (ref 13.0–17.0)
Immature Granulocytes: 0 %
Lymphocytes Relative: 24 %
Lymphs Abs: 1.4 10*3/uL (ref 0.7–4.0)
MCH: 31.6 pg (ref 26.0–34.0)
MCHC: 34.8 g/dL (ref 30.0–36.0)
MCV: 90.8 fL (ref 80.0–100.0)
Monocytes Absolute: 0.4 10*3/uL (ref 0.1–1.0)
Monocytes Relative: 7 %
Neutro Abs: 3.7 10*3/uL (ref 1.7–7.7)
Neutrophils Relative %: 65 %
Platelet Count: 137 10*3/uL — ABNORMAL LOW (ref 150–400)
RBC: 4.12 MIL/uL — ABNORMAL LOW (ref 4.22–5.81)
RDW: 12.2 % (ref 11.5–15.5)
WBC Count: 5.7 10*3/uL (ref 4.0–10.5)
nRBC: 0 % (ref 0.0–0.2)

## 2019-06-21 MED ORDER — SODIUM CHLORIDE (PF) 0.9 % IJ SOLN
INTRAMUSCULAR | Status: AC
  Filled 2019-06-21: qty 50

## 2019-06-21 MED ORDER — IOHEXOL 300 MG/ML  SOLN
75.0000 mL | Freq: Once | INTRAMUSCULAR | Status: AC | PRN
  Administered 2019-06-21: 15:00:00 75 mL via INTRAVENOUS

## 2019-06-23 ENCOUNTER — Encounter: Payer: Self-pay | Admitting: Internal Medicine

## 2019-06-23 ENCOUNTER — Inpatient Hospital Stay (HOSPITAL_BASED_OUTPATIENT_CLINIC_OR_DEPARTMENT_OTHER): Payer: Medicare Other | Admitting: Internal Medicine

## 2019-06-23 ENCOUNTER — Telehealth: Payer: Self-pay | Admitting: Internal Medicine

## 2019-06-23 ENCOUNTER — Other Ambulatory Visit: Payer: Self-pay

## 2019-06-23 VITALS — BP 119/55 | HR 55 | Temp 98.2°F | Resp 18 | Wt 173.0 lb

## 2019-06-23 DIAGNOSIS — Z9221 Personal history of antineoplastic chemotherapy: Secondary | ICD-10-CM

## 2019-06-23 DIAGNOSIS — C3490 Malignant neoplasm of unspecified part of unspecified bronchus or lung: Secondary | ICD-10-CM

## 2019-06-23 DIAGNOSIS — Z85118 Personal history of other malignant neoplasm of bronchus and lung: Secondary | ICD-10-CM | POA: Diagnosis not present

## 2019-06-23 DIAGNOSIS — Z923 Personal history of irradiation: Secondary | ICD-10-CM | POA: Diagnosis not present

## 2019-06-23 DIAGNOSIS — C3492 Malignant neoplasm of unspecified part of left bronchus or lung: Secondary | ICD-10-CM

## 2019-06-23 NOTE — Telephone Encounter (Signed)
Scheduled appt per 7/22 los - reminder letter mailed with appt date and time. Central radiology to contact patient with ct scan

## 2019-06-23 NOTE — Progress Notes (Signed)
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   PRIOR THERAPY:  1. Status post concurrent chemoradiation with weekly carboplatin and paclitaxel, last dose was given 04/08/2011.  2. Status post left lower lobectomy with mediastinal lymph node dissection under the care of Dr. Roxan Hockey on 06/11/2011 with no evidence for residual disease.  CURRENT THERAPY: Observation.  INTERVAL HISTORY: Rodney Christensen 78 y.o. male returns to the clinic annual follow-up visit.  His wife was available by phone.  The patient is feeling fine today with no concerning complaints.  He had a lot of skin surgery recently.  He denied having any chest pain, shortness of breath, cough or hemoptysis.  He has no nausea, vomiting, diarrhea or constipation.  He denied having any headache or visual changes.  The patient had repeat CT scan of the chest performed recently and he is here for evaluation and discussion of his scan results.   MEDICAL HISTORY: Past Medical History:  Diagnosis Date  . BPH (benign prostatic hyperplasia)   . CAD, NATIVE VESSEL 03/22/2010  . 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  . Diverticulosis   . History of radiation therapy 03/18/11 to 04/19/11   lower left lung  . HYPERLIPIDEMIA-MIXED 03/17/2009  . HYPERTENSION, UNSPECIFIED 03/17/2009  . Macular degeneration   . Skin cancer    melanoma-nose  . 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  MEDICATIONS:  Current Outpatient Medications  Medication Sig Dispense Refill  . amLODipine (NORVASC) 10 MG tablet Take 10 mg by mouth daily.    . Ascorbic Acid (VITAMIN C) 500 MG tablet Take 500 mg by mouth daily.     . B Complex  Vitamins (B COMPLEX 100 PO) Take by mouth daily.      . beta carotene 25000 UNIT capsule Take 25,000 Units by mouth daily.      . Bilberry 500 MG CAPS Take 500 mg by mouth daily.     . bimatoprost (LUMIGAN) 0.03 % ophthalmic drops Place 1 drop into both eyes at bedtime.      . Calcium-Magnesium-Zinc (CAL-MAG-ZINC PO) Take by mouth as directed.      . Chelated Zinc 50 MG TABS Take by mouth daily.      . Cinnamon 500 MG capsule Take 1,000 mg by mouth daily.     . Coenzyme Q10 (CO Q 10) 100 MG CAPS Take by mouth daily.      . dorzolamide (TRUSOPT) 2 % ophthalmic solution Place 1 drop into both eyes 2 (two) times daily as needed.    . dorzolamide-timolol (COSOPT) 22.3-6.8 MG/ML ophthalmic solution Place 1 drop into the left eye at bedtime.  5  . doxazosin (CARDURA) 8 MG tablet Take 8 mg by mouth at bedtime.      . fish oil-omega-3 fatty acids 1000 MG capsule Take 2 g by mouth 3 (three) times daily.      . folic acid (FOLVITE) 1 MG tablet Take 1 mg by mouth daily.      . Garlic 5176 MG CAPS Take by mouth daily.      . Ginkgo Biloba (GINKOBA) 40 MG TABS Take by mouth daily.      Marland Kitchen  lisinopril (PRINIVIL,ZESTRIL) 40 MG tablet Take 40 mg by mouth 2 (two) times daily.     . LUTEIN PO Take by mouth 2 (two) times daily.      . Melatonin 1 MG CAPS Take by mouth at bedtime.      . metoprolol (LOPRESSOR) 50 MG tablet Take 50 mg by mouth 2 (two) times daily.      . Multiple Vitamin (MULTIVITAMIN) tablet Take 1 tablet by mouth daily.      . potassium chloride SA (K-DUR,KLOR-CON) 20 MEQ tablet     . simvastatin (ZOCOR) 20 MG tablet Take 20 mg by mouth every evening.    . torsemide (DEMADEX) 20 MG tablet 20 mg daily.      No current facility-administered medications for this visit.     SURGICAL HISTORY:  Past Surgical History:  Procedure Laterality Date  . BRONCHOSCOPY  06/11/11   [facility]  . CAROTID ENDARTERECTOMY    . CORONARY ARTERY BYPASS GRAFT  10/10/2004   x5  . Lt VATS,Lt thoacotomy,Lt lower  lobectomy with  mediastinal node  dissection  06/11/11   [facility]    REVIEW OF SYSTEMS:  A comprehensive review of systems was negative.   PHYSICAL EXAMINATION: General appearance: alert, cooperative and no distress Head: Normocephalic, without obvious abnormality, atraumatic Neck: no adenopathy Lymph nodes: Cervical, supraclavicular, and axillary nodes normal. Resp: clear to auscultation bilaterally Back: symmetric, no curvature. ROM normal. No CVA tenderness. Cardio: regular rate and rhythm, S1, S2 normal, no murmur, click, rub or gallop GI: soft, non-tender; bowel sounds normal; no masses,  no organomegaly Extremities: extremities normal, atraumatic, no cyanosis or edema  ECOG PERFORMANCE STATUS: 1 - Symptomatic but completely ambulatory  Blood pressure (!) 119/55, pulse (!) 55, temperature 98.2 F (36.8 C), temperature source Oral, resp. rate 18, weight 173 lb 0.2 oz (78.5 kg), SpO2 99 %.  LABORATORY DATA: Lab Results  Component Value Date   WBC 5.7 06/21/2019   HGB 13.0 06/21/2019   HCT 37.4 (L) 06/21/2019   MCV 90.8 06/21/2019   PLT 137 (L) 06/21/2019      Chemistry      Component Value Date/Time   NA 139 06/21/2019 0810   NA 139 06/20/2017 0737   K 3.5 06/21/2019 0810   K 3.7 06/20/2017 0737   CL 105 06/21/2019 0810   CL 102 05/17/2013 0754   CO2 25 06/21/2019 0810   CO2 26 06/20/2017 0737   BUN 11 06/21/2019 0810   BUN 10.3 06/20/2017 0737   CREATININE 1.03 06/21/2019 0810   CREATININE 1.2 06/20/2017 0737      Component Value Date/Time   CALCIUM 8.8 (L) 06/21/2019 0810   CALCIUM 9.6 06/20/2017 0737   ALKPHOS 59 06/21/2019 0810   ALKPHOS 69 06/20/2017 0737   AST 14 (L) 06/21/2019 0810   AST 16 06/20/2017 0737   ALT 13 06/21/2019 0810   ALT 13 06/20/2017 0737   BILITOT 1.0 06/21/2019 0810   BILITOT 0.76 06/20/2017 0737       RADIOGRAPHIC STUDIES: Ct Chest W Contrast  Result Date: 06/21/2019 CLINICAL DATA:  Lung cancer. EXAM: CT CHEST WITH  CONTRAST TECHNIQUE: Multidetector CT imaging of the chest was performed during intravenous contrast administration. CONTRAST:  50mL OMNIPAQUE IOHEXOL 300 MG/ML  SOLN COMPARISON:  06/22/2018. FINDINGS: Cardiovascular: Atherosclerotic calcification of the aorta and aortic valve. Heart is enlarged. No pericardial effusion. Mediastinum/Nodes: Mediastinal lymph nodes are not enlarged by CT size criteria. No hilar or axillary adenopathy. Esophagus is grossly unremarkable. Lungs/Pleura:  Mild biapical pleuroparenchymal scarring. Mild centrilobular emphysema. Right lung is clear. Left lower lobectomy with post radiation collapse/consolidation and bronchiectasis in the posterior left hemithorax. Small amount of loculated left pleural fluid is seen in association, as before. Airway is otherwise unremarkable. Upper Abdomen: Liver is unremarkable. Stones are seen in the gallbladder. Adrenal glands are unremarkable. Stones are seen in the right kidney. Renal vascular calcifications in the left kidney. Subcentimeter low-attenuation lesion in the left kidney is too small to characterize. Spleen, pancreas, stomach and visualized portions of the bowel are unremarkable with exception of a small hiatal hernia. No upper abdominal adenopathy. Musculoskeletal: Degenerative changes in the spine. No worrisome lytic or sclerotic lesions. Thoracotomy changes on the left. IMPRESSION: 1. Left lower lobectomy with post treatment changes in the left hemithorax and small amount of loculated left pleural fluid, stable. No evidence of recurrent or metastatic disease. 2. Cholelithiasis. 3. Right renal stones. 4.  Aortic atherosclerosis (ICD10-170.0). 5.  Emphysema (ICD10-J43.9). Electronically Signed   By: Lorin Picket M.D.   On: 06/21/2019 17:02   ASSESSMENT AND PLAN: This is a very pleasant 78 years old white male with history of stage IIB non-small cell lung cancer status post concurrent chemoradiation followed by left lower lobectomy and  mediastinal lymph node dissection with no evidence for residual disease.  The patient is currently on observation and he is feeling fine. He had repeat CT scan of the chest performed recently.  I personally and independently reviewed the scans and discussed the results with the patient and his wife. His a scan showed no concerning findings for disease recurrence. I recommended for him to continue on observation with repeat CT scan of the chest in 1 year. He was advised to call immediately if he has any concerning symptoms in the interval. All questions were answered. The patient knows to call the clinic with any problems, questions or concerns. We can certainly see the patient much sooner if necessary.  Disclaimer: This note was dictated with voice recognition software. Similar sounding words can inadvertently be transcribed and may not be corrected upon review.

## 2020-06-20 ENCOUNTER — Ambulatory Visit (HOSPITAL_COMMUNITY)
Admission: RE | Admit: 2020-06-20 | Discharge: 2020-06-20 | Disposition: A | Discharge status: Home / Self Care | Payer: Medicare Other | Source: Ambulatory Visit | Attending: Internal Medicine | Admitting: Internal Medicine

## 2020-06-20 ENCOUNTER — Inpatient Hospital Stay: Payer: Medicare Other | Attending: Internal Medicine

## 2020-06-20 ENCOUNTER — Other Ambulatory Visit: Payer: Self-pay

## 2020-06-20 DIAGNOSIS — C3490 Malignant neoplasm of unspecified part of unspecified bronchus or lung: Secondary | ICD-10-CM

## 2020-06-20 DIAGNOSIS — E785 Hyperlipidemia, unspecified: Secondary | ICD-10-CM | POA: Diagnosis not present

## 2020-06-20 DIAGNOSIS — Z85820 Personal history of malignant melanoma of skin: Secondary | ICD-10-CM | POA: Insufficient documentation

## 2020-06-20 DIAGNOSIS — I10 Essential (primary) hypertension: Secondary | ICD-10-CM | POA: Diagnosis not present

## 2020-06-20 DIAGNOSIS — N400 Enlarged prostate without lower urinary tract symptoms: Secondary | ICD-10-CM | POA: Diagnosis not present

## 2020-06-20 DIAGNOSIS — Z85118 Personal history of other malignant neoplasm of bronchus and lung: Secondary | ICD-10-CM | POA: Insufficient documentation

## 2020-06-20 DIAGNOSIS — Z79899 Other long term (current) drug therapy: Secondary | ICD-10-CM | POA: Insufficient documentation

## 2020-06-20 DIAGNOSIS — Z902 Acquired absence of lung [part of]: Secondary | ICD-10-CM | POA: Insufficient documentation

## 2020-06-20 DIAGNOSIS — I2510 Atherosclerotic heart disease of native coronary artery without angina pectoris: Secondary | ICD-10-CM | POA: Diagnosis not present

## 2020-06-20 LAB — CBC WITH DIFFERENTIAL (CANCER CENTER ONLY)
Abs Immature Granulocytes: 0.02 10*3/uL (ref 0.00–0.07)
Basophils Absolute: 0 10*3/uL (ref 0.0–0.1)
Basophils Relative: 1 %
Eosinophils Absolute: 0.1 10*3/uL (ref 0.0–0.5)
Eosinophils Relative: 2 %
HCT: 36.2 % — ABNORMAL LOW (ref 39.0–52.0)
Hemoglobin: 12.4 g/dL — ABNORMAL LOW (ref 13.0–17.0)
Immature Granulocytes: 0 %
Lymphocytes Relative: 20 %
Lymphs Abs: 1.3 10*3/uL (ref 0.7–4.0)
MCH: 31.7 pg (ref 26.0–34.0)
MCHC: 34.3 g/dL (ref 30.0–36.0)
MCV: 92.6 fL (ref 80.0–100.0)
Monocytes Absolute: 0.5 10*3/uL (ref 0.1–1.0)
Monocytes Relative: 7 %
Neutro Abs: 4.6 10*3/uL (ref 1.7–7.7)
Neutrophils Relative %: 70 %
Platelet Count: 151 10*3/uL (ref 150–400)
RBC: 3.91 MIL/uL — ABNORMAL LOW (ref 4.22–5.81)
RDW: 12.3 % (ref 11.5–15.5)
WBC Count: 6.5 10*3/uL (ref 4.0–10.5)
nRBC: 0 % (ref 0.0–0.2)

## 2020-06-20 LAB — CMP (CANCER CENTER ONLY)
ALT: 11 U/L (ref 0–44)
AST: 15 U/L (ref 15–41)
Albumin: 3.8 g/dL (ref 3.5–5.0)
Alkaline Phosphatase: 65 U/L (ref 38–126)
Anion gap: 10 (ref 5–15)
BUN: 10 mg/dL (ref 8–23)
CO2: 25 mmol/L (ref 22–32)
Calcium: 9.6 mg/dL (ref 8.9–10.3)
Chloride: 106 mmol/L (ref 98–111)
Creatinine: 1.15 mg/dL (ref 0.61–1.24)
GFR, Est AFR Am: >60 mL/min (ref >60)
GFR, Estimated: 59 mL/min — ABNORMAL LOW (ref >60)
Glucose, Bld: 105 mg/dL — ABNORMAL HIGH (ref 70–99)
Potassium: 3.7 mmol/L (ref 3.5–5.1)
Sodium: 141 mmol/L (ref 135–145)
Total Bilirubin: 0.8 mg/dL (ref 0.3–1.2)
Total Protein: 6.8 g/dL (ref 6.5–8.1)

## 2020-06-20 MED ORDER — IOHEXOL 300 MG/ML  SOLN
75.0000 mL | Freq: Once | INTRAMUSCULAR | Status: AC | PRN
  Administered 2020-06-20: 75 mL via INTRAVENOUS

## 2020-06-20 MED ORDER — SODIUM CHLORIDE (PF) 0.9 % IJ SOLN
INTRAMUSCULAR | Status: AC
  Filled 2020-06-20: qty 50

## 2020-06-22 ENCOUNTER — Encounter: Payer: Self-pay | Admitting: Internal Medicine

## 2020-06-22 ENCOUNTER — Other Ambulatory Visit: Payer: Self-pay

## 2020-06-22 ENCOUNTER — Inpatient Hospital Stay (HOSPITAL_BASED_OUTPATIENT_CLINIC_OR_DEPARTMENT_OTHER): Payer: Medicare Other | Admitting: Internal Medicine

## 2020-06-22 VITALS — BP 107/61 | HR 65 | Temp 97.9°F | Resp 17 | Ht 67.0 in | Wt 168.8 lb

## 2020-06-22 DIAGNOSIS — C3490 Malignant neoplasm of unspecified part of unspecified bronchus or lung: Secondary | ICD-10-CM | POA: Diagnosis not present

## 2020-06-22 DIAGNOSIS — Z85118 Personal history of other malignant neoplasm of bronchus and lung: Secondary | ICD-10-CM | POA: Diagnosis not present

## 2020-06-22 NOTE — Progress Notes (Signed)
[place] [phone]   Fax:(336) [phone]  OFFICE PROGRESS NOTE  [name], MD [place] [address]  PRINCIPAL DIAGNOSIS: Stage IIB (T3 N0 M0) non-small cell lung cancer consistent with squamous cell carcinoma diagnosed in March of 2012.   PRIOR THERAPY:  1. Status post concurrent chemoradiation with weekly carboplatin and paclitaxel, last dose was given 04/08/2011.  2. Status post left lower lobectomy with mediastinal lymph node dissection under the care of Dr. Roxan Hockey on 06/11/2011 with no evidence for residual disease.  CURRENT THERAPY: Observation.  INTERVAL HISTORY: Rodney Christensen 79 y.o. male returns to the clinic today for follow-up visit accompanied by his wife.  The patient is feeling fine today with no concerning complaints except for fatigue and lack of exercise.  He denied having any current chest pain, shortness of breath, cough or hemoptysis.  He denied having any fever or chills.  He has no nausea, vomiting, diarrhea or constipation.  He denied having any headache or visual changes.  He had repeat CT scan of the chest performed recently and is here for evaluation and discussion of his scan results.  MEDICAL HISTORY: Past Medical History:  Diagnosis Date  . BPH (benign prostatic hyperplasia)   . CAD, NATIVE VESSEL 03/22/2010  . 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  . Diverticulosis   . History of radiation therapy 03/18/11 to 04/19/11   lower left lung  . HYPERLIPIDEMIA-MIXED 03/17/2009  . HYPERTENSION, UNSPECIFIED 03/17/2009  . Macular degeneration   . Skin cancer    melanoma-nose  . 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  MEDICATIONS:  Current Outpatient Medications  Medication Sig Dispense Refill  . amLODipine (NORVASC) 10 MG tablet Take 10 mg by mouth daily.    . Ascorbic Acid (VITAMIN C) 500 MG tablet Take 500 mg by mouth  daily.     . B Complex Vitamins (B COMPLEX 100 PO) Take by mouth daily.      . beta carotene 25000 UNIT capsule Take 25,000 Units by mouth daily.      . Bilberry 500 MG CAPS Take 500 mg by mouth daily.     . bimatoprost (LUMIGAN) 0.03 % ophthalmic drops Place 1 drop into both eyes at bedtime.      . Calcium-Magnesium-Zinc (CAL-MAG-ZINC PO) Take by mouth as directed.      . Chelated Zinc 50 MG TABS Take by mouth daily.      . Cinnamon 500 MG capsule Take 1,000 mg by mouth daily.     . Coenzyme Q10 (CO Q 10) 100 MG CAPS Take by mouth daily.      . dorzolamide (TRUSOPT) 2 % ophthalmic solution Place 1 drop into both eyes 2 (two) times daily as needed.    . dorzolamide-timolol (COSOPT) 22.3-6.8 MG/ML ophthalmic solution Place 1 drop into the left eye at bedtime.  5  . doxazosin (CARDURA) 8 MG tablet Take 8 mg by mouth at bedtime.      . fish oil-omega-3 fatty acids 1000 MG capsule Take 2 g by mouth 3 (three) times daily.      . folic acid (FOLVITE) 1 MG tablet Take 1 mg by mouth daily.      . Garlic 8546 MG CAPS Take by mouth daily.      . Ginkgo Biloba (GINKOBA) 40 MG TABS Take by mouth daily.      Marland Kitchen  lisinopril (PRINIVIL,ZESTRIL) 40 MG tablet Take 40 mg by mouth 2 (two) times daily.     . LUTEIN PO Take by mouth 2 (two) times daily.      . Melatonin 1 MG CAPS Take by mouth at bedtime.      . metoprolol (LOPRESSOR) 50 MG tablet Take 50 mg by mouth 2 (two) times daily.      . Multiple Vitamin (MULTIVITAMIN) tablet Take 1 tablet by mouth daily.      . potassium chloride SA (K-DUR,KLOR-CON) 20 MEQ tablet     . simvastatin (ZOCOR) 20 MG tablet Take 20 mg by mouth every evening.    . torsemide (DEMADEX) 20 MG tablet 20 mg daily.      No current facility-administered medications for this visit.    SURGICAL HISTORY:  Past Surgical History:  Procedure Laterality Date  . BRONCHOSCOPY  06/11/11   [facility]  . CAROTID ENDARTERECTOMY    . CORONARY ARTERY BYPASS GRAFT  10/10/2004   x5  . Lt  VATS,Lt thoacotomy,Lt lower lobectomy with  mediastinal node  dissection  06/11/11   [facility]    REVIEW OF SYSTEMS:  A comprehensive review of systems was negative except for: Constitutional: positive for fatigue   PHYSICAL EXAMINATION: General appearance: alert, cooperative and no distress Head: Normocephalic, without obvious abnormality, atraumatic Neck: no adenopathy Lymph nodes: Cervical, supraclavicular, and axillary nodes normal. Resp: clear to auscultation bilaterally Back: symmetric, no curvature. ROM normal. No CVA tenderness. Cardio: regular rate and rhythm, S1, S2 normal, no murmur, click, rub or gallop GI: soft, non-tender; bowel sounds normal; no masses,  no organomegaly Extremities: extremities normal, atraumatic, no cyanosis or edema  ECOG PERFORMANCE STATUS: 1 - Symptomatic but completely ambulatory  Blood pressure 107/61, pulse 65, temperature 97.9 F (36.6 C), temperature source Temporal, resp. rate 17, height 5\' 7"  (1.702 m), weight 168 lb 12.8 oz (76.6 kg), SpO2 98 %.  LABORATORY DATA: Lab Results  Component Value Date   WBC 6.5 06/20/2020   HGB 12.4 (L) 06/20/2020   HCT 36.2 (L) 06/20/2020   MCV 92.6 06/20/2020   PLT 151 06/20/2020      Chemistry      Component Value Date/Time   NA 141 06/20/2020 1132   NA 139 06/20/2017 0737   K 3.7 06/20/2020 1132   K 3.7 06/20/2017 0737   CL 106 06/20/2020 1132   CL 102 05/17/2013 0754   CO2 25 06/20/2020 1132   CO2 26 06/20/2017 0737   BUN 10 06/20/2020 1132   BUN 10.3 06/20/2017 0737   CREATININE 1.15 06/20/2020 1132   CREATININE 1.2 06/20/2017 0737      Component Value Date/Time   CALCIUM 9.6 06/20/2020 1132   CALCIUM 9.6 06/20/2017 0737   ALKPHOS 65 06/20/2020 1132   ALKPHOS 69 06/20/2017 0737   AST 15 06/20/2020 1132   AST 16 06/20/2017 0737   ALT 11 06/20/2020 1132   ALT 13 06/20/2017 0737   BILITOT 0.8 06/20/2020 1132   BILITOT 0.76 06/20/2017 0737       RADIOGRAPHIC STUDIES: CT Chest W  Contrast  Result Date: 06/20/2020 CLINICAL DATA:  Non-small cell lung cancer staging with history of prior partial lung resection in the LEFT chest and post radiation changes. EXAM: CT CHEST WITH CONTRAST TECHNIQUE: Multidetector CT imaging of the chest was performed during intravenous contrast administration. CONTRAST:  33mL OMNIPAQUE IOHEXOL 300 MG/ML  SOLN COMPARISON:  Is multiple prior studies most recent 06/21/2019 FINDINGS: Cardiovascular: Calcific and noncalcific atheromatous plaque  of the thoracic aorta. No sign of aneurysmal dilation. Signs of prior CABG. Stable appearance of the heart and central pulmonary vasculature. Mediastinum/Nodes: Thoracic inlet structures are normal. No axillary lymphadenopathy. Fullness of the LEFT hilum and soft tissue contiguous with LEFT hilum due to post radiation changes without change compared to the prior study. Scattered small lymph nodes in the mediastinum are unchanged. Esophagus with mild thickening in the setting of small hiatal hernia with similar appearance. Lungs/Pleura: Consolidation and bronchiectatic changes in treated lung following LEFT lower lobectomy adjacent to the LEFT hilum as described previously with small LEFT-sided pleural effusion. Findings are unchanged. Biapical scarring. Dependent atelectasis. RIGHT lung is clear. Upper Abdomen: Cholelithiasis. Renal cortical scarring. Adrenal glands are normal. No acute process in the upper abdomen with elevation of LEFT hemidiaphragm related to prior lobectomy and post treatment change. Musculoskeletal: Spinal degenerative changes. No acute or destructive bone finding. Post median sternotomy. IMPRESSION: 1. Stable post radiation changes following LEFT lower lobectomy. 2. No findings to suggest recurrent or metastatic disease. 3. Cholelithiasis. 4. Aortic atherosclerosis. Aortic Atherosclerosis (ICD10-I70.0) and Emphysema (ICD10-J43.9). Electronically Signed   By: Zetta Bills M.D.   On: 06/20/2020 17:13    ASSESSMENT AND PLAN: This is a very pleasant 79 years old white male with history of stage IIB non-small cell lung cancer status post concurrent chemoradiation followed by left lower lobectomy and mediastinal lymph node dissection with no evidence for residual disease.  The patient has been on observation for the last 9 years and he is doing fine today with no concerning complaints. Repeat CT scan of the chest performed recently showed no concerning findings for disease recurrence or metastasis. I recommended for the patient to continue on observation and routine follow-up visit by his primary care physician at this point. I will be happy to see him in the future if he has any other concerning issues. All questions were answered. The patient knows to call the clinic with any problems, questions or concerns. We can certainly see the patient much sooner if necessary.  Disclaimer: This note was dictated with voice recognition software. Similar sounding words can inadvertently be transcribed and may not be corrected upon review.

## 2021-07-06 IMAGING — CT CT CHEST W/ CM
2 of 4 series · 15 of 36 positions shown, 18 images · IV contrast (OMNIPAQUE)
Comparison: Is multiple prior studies most recent 06/21/2019

CLINICAL DATA: Non-small cell lung cancer staging with history of
prior partial lung resection in the LEFT chest and post radiation
changes.

EXAM:
CT CHEST WITH CONTRAST
TECHNIQUE: Multidetector CT imaging of the chest was performed during
intravenous contrast administration.
CONTRAST:  75mL OMNIPAQUE IOHEXOL 300 MG/ML  SOLN

[Series 2: axial st · axial · 0.79mm/px · z∈[-339,-43]mm · 12 of 174 slices shown, 15 images]
[im 13/174  mediastinal]
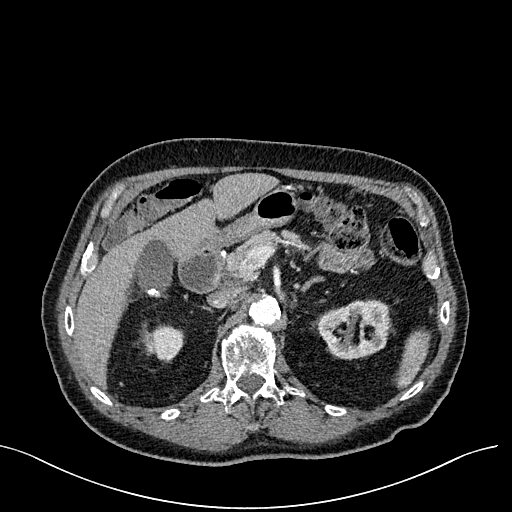
[im 13/174  lung]
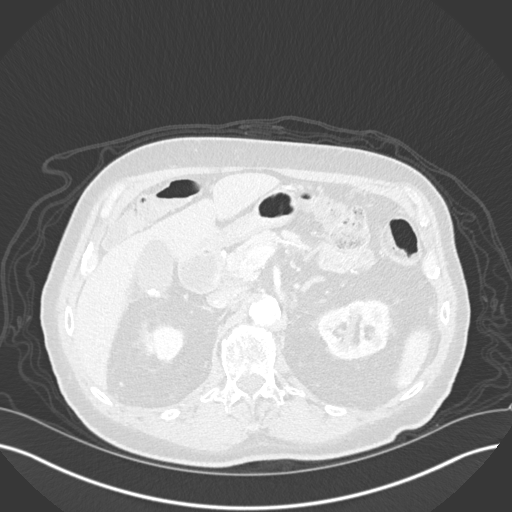
[im 25/174  lung]
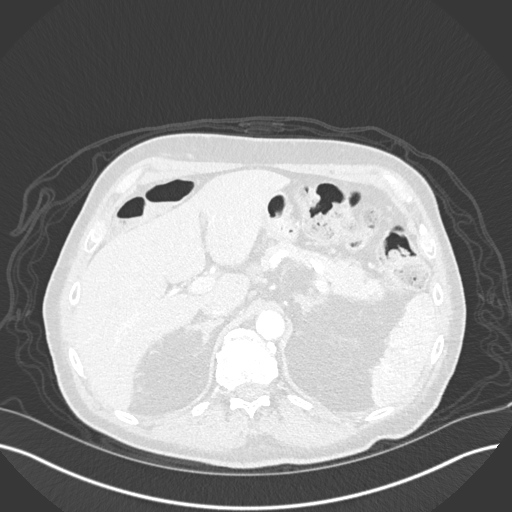
[im 38/174  lung]
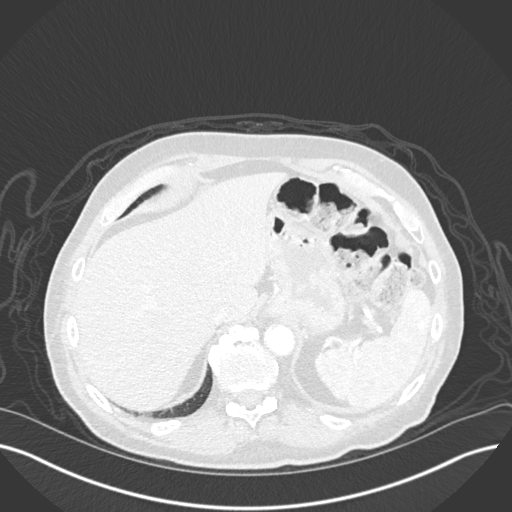
[im 50/174  lung]
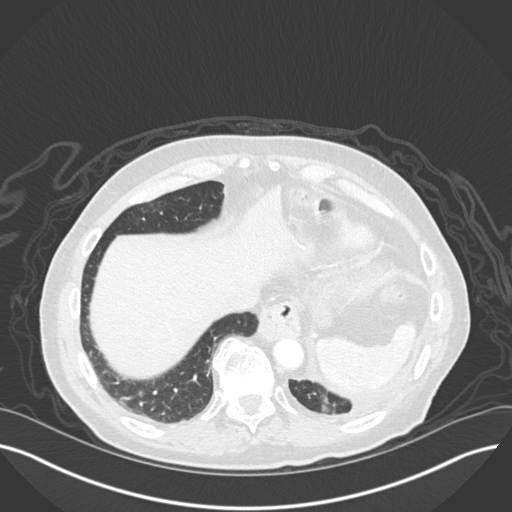
[im 62/174  mediastinal]
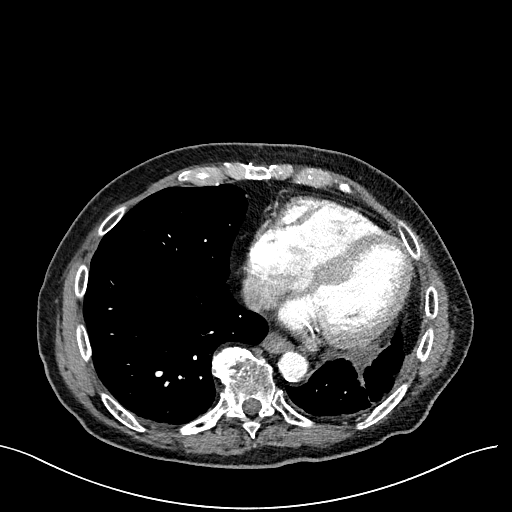
[im 62/174  lung]
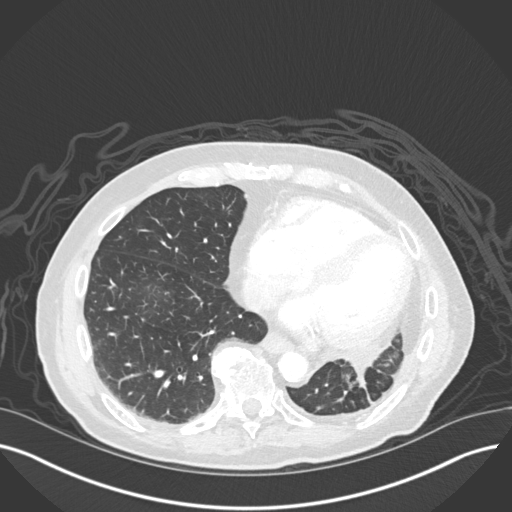
[im 75/174  lung]
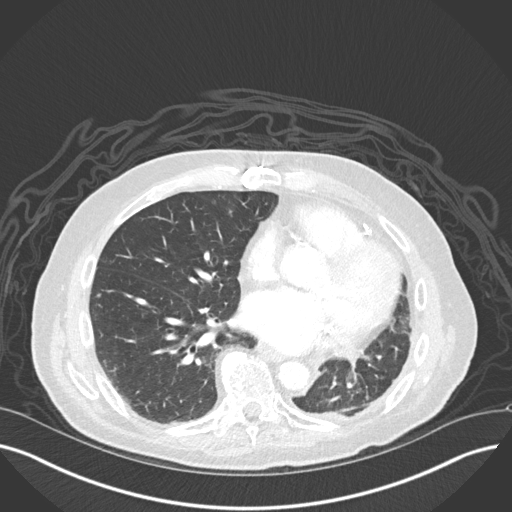
[im 99/174  lung]
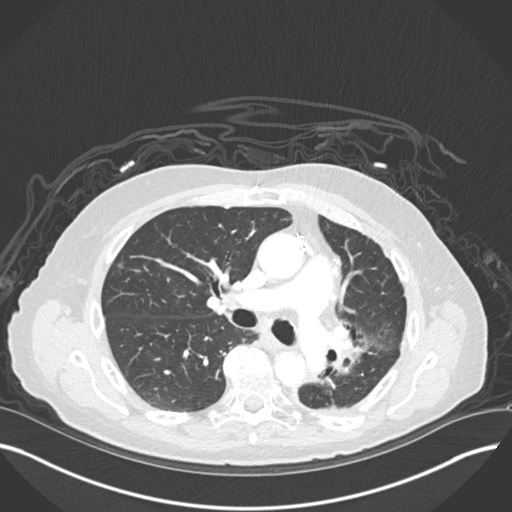
[im 112/174  lung]
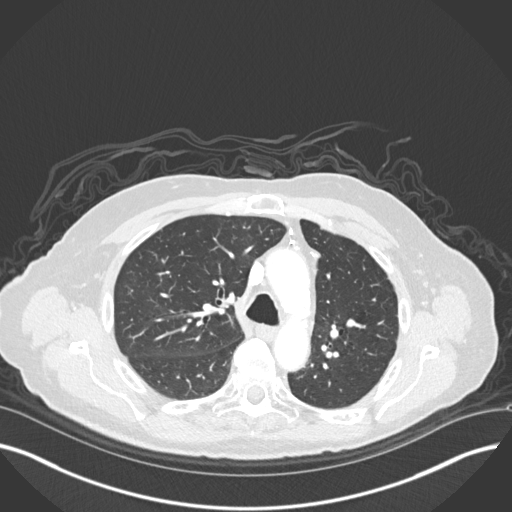
[im 124/174  mediastinal]
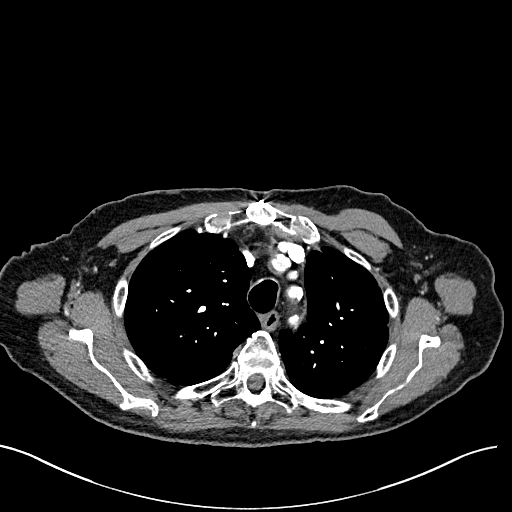
[im 124/174  lung]
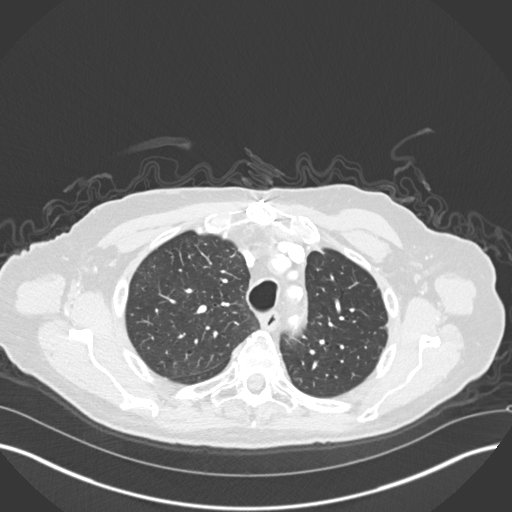
[im 136/174  lung]
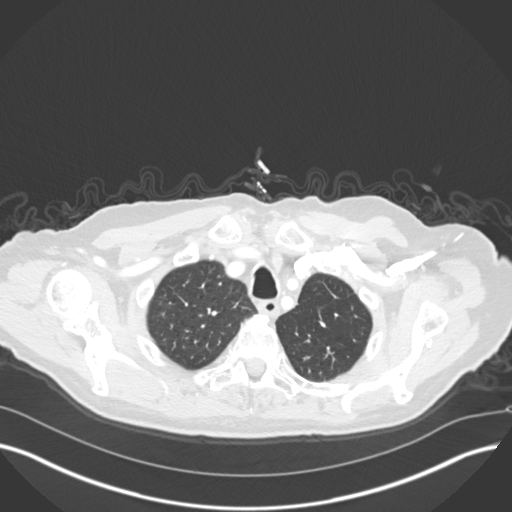
[im 149/174  lung]
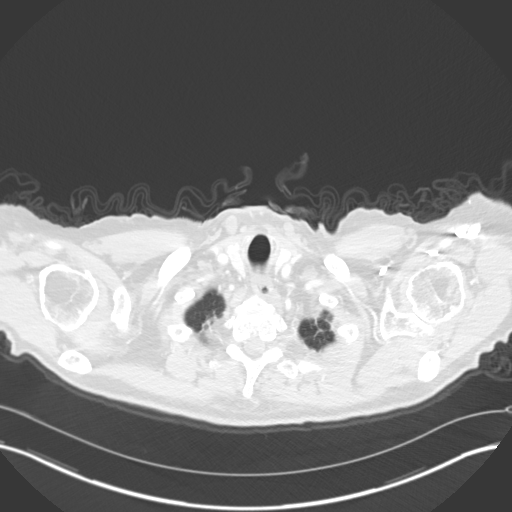
[im 161/174  lung]
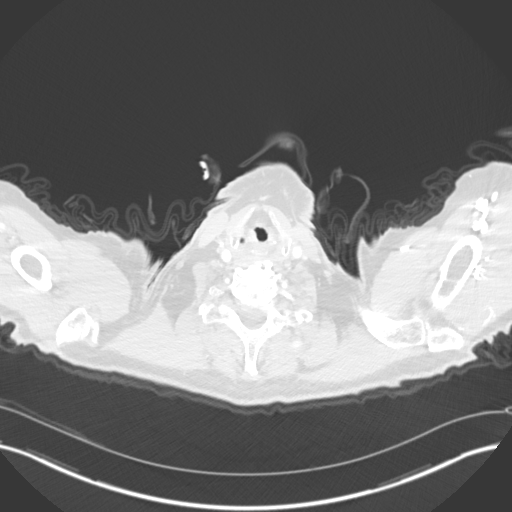

[Series 5: coronal · coronal · 0.63mm/px · 3 of 135 slices shown]
[im 27/135  lung]
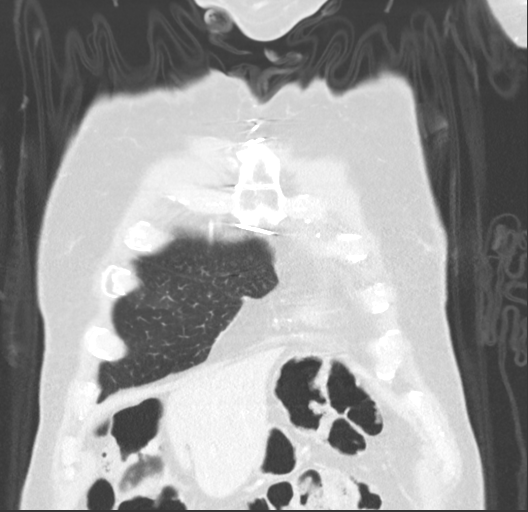
[im 54/135  lung]
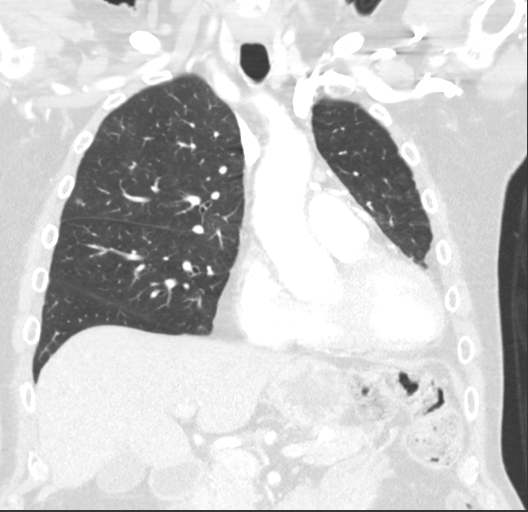
[im 81/135  lung]
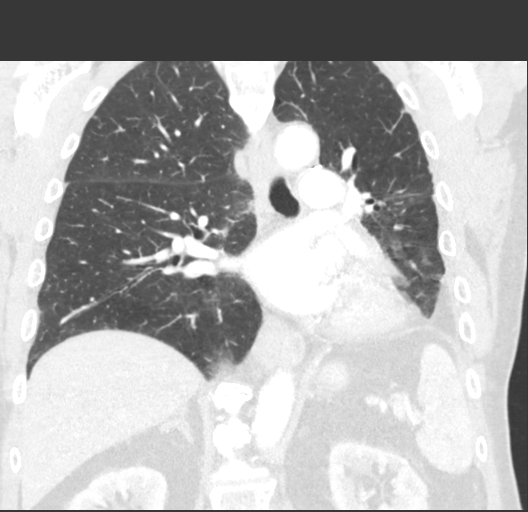

[15 of 36 positions shown; findings below may reference images not displayed]

FINDINGS: Cardiovascular: Calcific and noncalcific atheromatous plaque of the
thoracic aorta. No sign of aneurysmal dilation. Signs of prior CABG.
Stable appearance of the heart and central pulmonary vasculature.

Mediastinum/Nodes: Thoracic inlet structures are normal. No axillary
lymphadenopathy. Fullness of the LEFT hilum and soft tissue
contiguous with LEFT hilum due to post radiation changes without
change compared to the prior study. Scattered small lymph nodes in
the mediastinum are unchanged. Esophagus with mild thickening in the
setting of small hiatal hernia with similar appearance.

Lungs/Pleura: Consolidation and bronchiectatic changes in treated
lung following LEFT lower lobectomy adjacent to the LEFT hilum as
described previously with small LEFT-sided pleural effusion.
Findings are unchanged. Biapical scarring. Dependent atelectasis.
RIGHT lung is clear.

Upper Abdomen: Cholelithiasis. Renal cortical scarring. Adrenal
glands are normal. No acute process in the upper abdomen with
elevation of LEFT hemidiaphragm related to prior lobectomy and post
treatment change.

Musculoskeletal: Spinal degenerative changes. No acute or
destructive bone finding. Post median sternotomy.
IMPRESSION: 1. Stable post radiation changes following LEFT lower lobectomy.
2. No findings to suggest recurrent or metastatic disease.
3. Cholelithiasis.
4. Aortic atherosclerosis.

Aortic Atherosclerosis (GXLUJ-AVY.[id number]) and Emphysema (GXLUJ-[id number].G).

## 2021-08-24 ENCOUNTER — Other Ambulatory Visit: Payer: Self-pay | Admitting: Family Medicine

## 2021-08-24 DIAGNOSIS — G3184 Mild cognitive impairment, so stated: Secondary | ICD-10-CM

## 2021-09-01 ENCOUNTER — Other Ambulatory Visit: Payer: Self-pay

## 2021-09-01 ENCOUNTER — Ambulatory Visit
Admission: RE | Admit: 2021-09-01 | Discharge: 2021-09-01 | Disposition: A | Discharge status: Home / Self Care | Payer: Medicare Other | Source: Ambulatory Visit | Attending: Family Medicine | Admitting: Family Medicine

## 2021-09-01 DIAGNOSIS — G3184 Mild cognitive impairment, so stated: Secondary | ICD-10-CM

## 2021-09-13 ENCOUNTER — Encounter: Payer: Self-pay | Admitting: Physician Assistant

## 2021-09-17 ENCOUNTER — Encounter: Payer: Self-pay | Admitting: Physician Assistant

## 2021-09-17 ENCOUNTER — Ambulatory Visit: Payer: Medicare Other | Admitting: Physician Assistant

## 2021-09-17 ENCOUNTER — Other Ambulatory Visit: Payer: Self-pay | Admitting: Physician Assistant

## 2021-09-17 ENCOUNTER — Other Ambulatory Visit: Payer: Self-pay

## 2021-09-17 VITALS — BP 121/68 | HR 60 | Resp 18 | Ht 66.0 in | Wt 175.0 lb

## 2021-09-17 DIAGNOSIS — F0150 Vascular dementia without behavioral disturbance: Secondary | ICD-10-CM | POA: Diagnosis not present

## 2021-09-17 DIAGNOSIS — F0280 Dementia in other diseases classified elsewhere without behavioral disturbance: Secondary | ICD-10-CM | POA: Diagnosis not present

## 2021-09-17 DIAGNOSIS — G309 Alzheimer's disease, unspecified: Secondary | ICD-10-CM

## 2021-09-17 MED ORDER — MEMANTINE HCL 10 MG PO TABS: ORAL_TABLET | ORAL | 11 refills | Status: DC

## 2021-09-17 NOTE — Progress Notes (Addendum)
Assessment/Plan:   Rodney Christensen is a 80 y.o. year old male with risk factors including  age, hypertension, hyperlipidemia, bradycardia, history of lung Ca s/p resection, s/p chemo XRT, CAD sp CABG,  seen today for evaluation of memory loss.  MRI of the brain without contrast on 09/01/2021 remarkable for moderate cerebral atrophy, small chronic left parietal infarct, without any acute intracranial abnormalities. 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  Discussed the importance of regular daily schedule to maintain brain function.  Continue to monitor mood with PCP.  Stay active at least 30 minutes at least 3 times a week.  Naps should be scheduled and should be no longer than 60 minutes and should not occur after 2 PM.  Mediterranean diet is recommended  Folllow up once results above are available   Subjective:    The patient is seen in neurologic consultation at the request of [name], MD for the evaluation of memory.  The patient is accompanied by his wife who supplements the history. This is a 80 y.o. year old male who has had memory issues for about 3-1/2 years, worse over the last 2 years, especially during the [place] pandemic.  He repeats the same questions, sometimes he "does not know where he is ".  He also repeats the same sentences.  He lives with his wife, who noticed these changes as well.  His mood is good, denies depression or irritability.  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  He "reads very little "because he is eyesight is very poor.  He has issues with insomnia, sleeping 1 hour at the time, "gets up and goes to the bathroom, and a good  night he will wake up once ".  He sleeps about 6 hours.  He denies any vivid dreams, or sleepwalking.  He denies any hallucinations or paranoia.  He denies leaving objects in unusual places.  He is independent of bathing and dressing.  Wife administers the medication and manages the finances.  His appetite is "okay", eating 3 meals a day, denies trouble swallowing.  He does not cook, but can make a sandwich. Ambulates  without a walker, unless needed, denies any falls or head injuries.  He stopped driving in the [id number] due to eyesight difficulties.  He denies any headaches, dizziness, focal numbness or tingling, unilateral weakness or tremors.  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  No history of seizures.  Denies constipation or diarrhea.  He has a history of urine incontinence.  He denies a history of sleep apnea, alcohol or tobacco.  Family history remarkable for aunt with dementia of unknown type.    MRI brain wo contrast 09/01/21  1. No acute intracranial abnormality. 2. Progressive, moderate cerebral atrophy. 3. Small chronic left parietal infarct.  B12 and TSH at the PCPs office were normal  Allergies  Allergen Reactions   Cephalexin     REACTION: Hives   Latanoprost     REACTION: Reaction not known.    Current Outpatient Medications  Medication Instructions   amLODipine (NORVASC) 10 mg, Daily   B Complex Vitamins (B COMPLEX 100 PO)  Daily   beta carotene 25,000 Units, Daily   Bilberry 500 mg, Daily   bimatoprost (LUMIGAN) 0.03 % ophthalmic drops 1 drop, Daily at bedtime   Calcium-Magnesium-Zinc (CAL-MAG-ZINC PO) As directed   Chelated Zinc 50 MG TABS Daily   Cinnamon 1,000 mg, Daily   Coenzyme Q10 (CO Q 10) 100 MG CAPS Daily   dorzolamide (TRUSOPT) 2 % ophthalmic solution 1 drop, 2 times daily PRN   dorzolamide-timolol (COSOPT) 22.3-6.8 MG/ML ophthalmic solution 1 drop, Left Eye, Daily at bedtime   doxazosin (CARDURA) 8 mg, Daily at bedtime   fish oil-omega-3  fatty acids 2 g, 3 times daily   folic acid (FOLVITE) 1 mg, Daily   Garlic 6948 MG CAPS Daily   Ginkgo Biloba 40 MG TABS Daily   lisinopril (ZESTRIL) 40 mg, 2 times daily   LUTEIN PO 2 times daily   Melatonin 1 MG CAPS Daily at bedtime   memantine (NAMENDA) 10 MG tablet Take 1 tablet (10 mg at night) for 2 weeks, then increase to 1 tablet (10 mg) twice a day   metoprolol tartrate (LOPRESSOR) 50 mg, 2 times daily   Multiple Vitamin (MULTIVITAMIN) tablet 1 tablet, Daily   potassium chloride SA (K-DUR,KLOR-CON) 20 MEQ tablet No dose, route, or frequency recorded.   simvastatin (ZOCOR) 20 mg, Every evening   torsemide (DEMADEX) 20 mg, Daily   vitamin C (ASCORBIC ACID) 500 mg, Daily     VITALS:   Vitals:   09/17/21 0943  BP: 121/68  Pulse: 60  Resp: 18  SpO2: 96%  Weight: 175 lb (79.4 kg)  Height: 5\' 6"  (1.676 m)   No flowsheet data found.  PHYSICAL EXAM   HEENT:  Normocephalic, atraumatic. The mucous membranes are moist. The superficial temporal arteries are without ropiness or tenderness. Cardiovascular: Regular rate and rhythm. Lungs: Clear to auscultation bilaterally. Neck: There are no carotid bruits noted bilaterally.  NEUROLOGICAL: [place] Cognitive Assessment  09/17/2021  Attention: Read list of digits (0/2) 2  Attention: Read list of letters (0/1) 1  Attention: Serial 7 subtraction starting at 100 (0/3) 3  Language: Repeat phrase (0/2) 1  Language : Fluency (0/1) 1  Abstraction (0/2) 2  Delayed Recall (0/5) 0  Orientation (0/6) 2   No flowsheet data found.  No flowsheet data found.    Orientation:  Alert and oriented to person, not to place or time 2/6 . No aphasia or dysarthria. Fund of knowledge is appropriate. Recent memory impaired and remote memory intact.  Attention and concentration are normal.  Able to name objects and repeat phrases. Delayed recall 0/5, language 1/2, fluency 5 words out of 11. Cranial nerves: There is good facial symmetry.  Right  disconjugate at lateral deviation of the eye, otherwise extraocular muscles are intact and visual fields are full to confrontational testing. Speech is fluent and clear. Soft palate rises symmetrically and there is no tongue deviation. Hearing is intact to conversational tone. Tone: Tone is good throughout. Sensation: Sensation is intact to light touch and pinprick throughout. Vibration is intact at the bilateral big toe.There is no extinction with double simultaneous stimulation. There is no sensory dermatomal level identified. Coordination: The patient has no difficulty with RAM's or FNF bilaterally. Normal finger to nose  Motor: Strength is 5/5 in the bilateral upper and lower extremities. There is no pronator drift. There are no fasciculations noted. DTR's: Deep tendon reflexes are 2/4 at the bilateral biceps, triceps, brachioradialis, patella and achilles.  Plantar responses are downgoing bilaterally. Gait and  Station: The patient is able to ambulate without difficulty.The patient is able to heel toe walk without any difficulty.The patient is able to ambulate in a tandem fashion. The patient is able to stand in the Romberg position.     Thank you for allowing [place] the opportunity to participate in the care of this nice patient. Please do not hesitate to contact us for any questions or concerns.   Total time spent on today's visit was 60 minutes, including both face-to-face time and nonface-to-face time.  Time included that spent on review of records (prior notes available to me/labs/imaging if pertinent), discussing treatment and goals, answering patient's questions and coordinating care.  Cc:  [name], MD  Sharene Butters 09/17/2021 1:28 PM

## 2021-09-17 NOTE — Patient Instructions (Signed)
It was a pleasure to see you today at our office.   Recommendations:  Start Memantine 10mg  tablets.  Take 1 tablet at bedtime for 2 weeks, then 1 tablet twice daily.   Side effects include dizziness, headache, diarrhea or constipation.  Call with any questions or concerns.  Follow up in 6 months  RECOMMENDATIONS FOR ALL PATIENTS WITH MEMORY PROBLEMS: 1. Continue to exercise (Recommend 30 minutes of walking everyday, or 3 hours every week) 2. Increase social interactions - continue going to [place] and enjoy social gatherings with friends and family 3. Eat healthy, avoid fried foods and eat more fruits and vegetables 4. Maintain adequate blood pressure, blood sugar, and blood cholesterol level. Reducing the risk of stroke and cardiovascular disease also helps promoting better memory. 5. Avoid stressful situations. Live a simple life and avoid aggravations. Organize your time and prepare for the next day in anticipation. 6. Sleep well, avoid any interruptions of sleep and avoid any distractions in the bedroom that may interfere with adequate sleep quality 7. Avoid sugar, avoid sweets as there is a strong link between excessive sugar intake, diabetes, and cognitive impairment We discussed the Mediterranean diet, which has been shown to help patients reduce the risk of progressive memory disorders and reduces cardiovascular risk. This includes eating fish, eat fruits and green leafy vegetables, nuts like almonds and hazelnuts, walnuts, and also use olive oil. Avoid fast foods and fried foods as much as possible. Avoid sweets and sugar as sugar use has been linked to worsening of memory function.  There is always a concern of gradual progression of memory problems. If this is the case, then we may need to adjust level of care according to patient needs. Support, both to the patient and caregiver, should then be put into place.    FALL PRECAUTIONS: Be cautious when walking. Scan the area for obstacles  that may increase the risk of trips and falls. When getting up in the mornings, sit up at the edge of the bed for a few minutes before getting out of bed. Consider elevating the bed at the head end to avoid drop of blood pressure when getting up. Walk always in a well-lit room (use night lights in the walls). Avoid area rugs or power cords from appliances in the middle of the walkways. Use a walker or a cane if necessary and consider physical therapy for balance exercise. Get your eyesight checked regularly.     HOME SAFETY: Consider the safety of the kitchen when operating appliances like stoves, microwave oven, and blender. Consider having supervision and share cooking responsibilities until no longer able to participate in those. Accidents with firearms and other hazards in the house should be identified and addressed as well.   ABILITY TO BE LEFT ALONE: If patient is unable to contact 911 operator, consider using LifeLine, or when the need is there, arrange for someone to stay with patients. Smoking is a fire hazard, consider supervision or cessation. Risk of wandering should be assessed by caregiver and if detected at any point, supervision and safe proof recommendations should be instituted.  MEDICATION SUPERVISION: Inability to self-administer medication needs to be constantly addressed. Implement a mechanism to ensure safe administration of the medications.       Mediterranean Diet A Mediterranean diet refers to food and lifestyle choices that are based on the traditions of countries located on the [employer]. This way of eating has been shown to help prevent certain conditions and improve outcomes for people  who have chronic diseases, like kidney disease and heart disease. What are tips for following this plan? Lifestyle  Cook and eat meals together with your family, when possible. Drink enough fluid to keep your urine clear or pale yellow. Be physically active every day. This  includes: Aerobic exercise like running or swimming. Leisure activities like gardening, walking, or housework. Get 7-8 hours of sleep each night. If recommended by your health care provider, drink red wine in moderation. This means 1 glass a day for nonpregnant women and 2 glasses a day for men. A glass of wine equals 5 oz (150 mL). Reading food labels  Check the serving size of packaged foods. For foods such as rice and pasta, the serving size refers to the amount of cooked product, not dry. Check the total fat in packaged foods. Avoid foods that have saturated fat or trans fats. Check the ingredients list for added sugars, such as corn syrup. Shopping  At the grocery store, buy most of your food from the areas near the walls of the store. This includes: Fresh fruits and vegetables (produce). Grains, beans, nuts, and seeds. Some of these may be available in unpackaged forms or large amounts (in bulk). Fresh seafood. Poultry and eggs. Low-fat dairy products. Buy whole ingredients instead of prepackaged foods. Buy fresh fruits and vegetables in-season from local farmers markets. Buy frozen fruits and vegetables in resealable bags. If you do not have access to quality fresh seafood, buy precooked frozen shrimp or canned fish, such as tuna, salmon, or sardines. Buy small amounts of raw or cooked vegetables, salads, or olives from the deli or salad bar at your store. Stock your pantry so you always have certain foods on hand, such as olive oil, canned tuna, canned tomatoes, rice, pasta, and beans. Cooking  Cook foods with extra-virgin olive oil instead of using butter or other vegetable oils. Have meat as a side dish, and have vegetables or grains as your main dish. This means having meat in small portions or adding small amounts of meat to foods like pasta or stew. Use beans or vegetables instead of meat in common dishes like chili or lasagna. Experiment with different cooking methods. Try  roasting or broiling vegetables instead of steaming or sauteing them. Add frozen vegetables to soups, stews, pasta, or rice. Add nuts or seeds for added healthy fat at each meal. You can add these to yogurt, salads, or vegetable dishes. Marinate fish or vegetables using olive oil, lemon juice, garlic, and fresh herbs. Meal planning  Plan to eat 1 vegetarian meal one day each week. Try to work up to 2 vegetarian meals, if possible. Eat seafood 2 or more times a week. Have healthy snacks readily available, such as: Vegetable sticks with hummus. Greek yogurt. Fruit and nut trail mix. Eat balanced meals throughout the week. This includes: Fruit: 2-3 servings a day Vegetables: 4-5 servings a day Low-fat dairy: 2 servings a day Fish, poultry, or lean meat: 1 serving a day Beans and legumes: 2 or more servings a week Nuts and seeds: 1-2 servings a day Whole grains: 6-8 servings a day Extra-virgin olive oil: 3-4 servings a day Limit red meat and sweets to only a few servings a month What are my food choices? Mediterranean diet Recommended Grains: Whole-grain pasta. Brown rice. Bulgar wheat. Polenta. Couscous. Whole-wheat bread. [place]. Vegetables: Artichokes. Beets. Broccoli. Cabbage. Carrots. Eggplant. Green beans. Chard. Kale. Spinach. Onions. Leeks. Peas. Squash. Tomatoes. Peppers. Radishes. Fruits: Apples. Apricots. Avocado. Berries. Bananas.  [place]. Dates. Figs. Grapes. Lemons. Melon. Oranges. Peaches. Plums. Pomegranate. Meats and other protein foods: Beans. Almonds. Sunflower seeds. Pine nuts. Peanuts. [place]. Salmon. Scallops. Shrimp. Loving. Tilapia. Clams. Oysters. Eggs. Dairy: Low-fat milk. Cheese. Greek yogurt. Beverages: Water. Red wine. Herbal tea. Fats and oils: Extra virgin olive oil. Avocado oil. Grape seed oil. Sweets and desserts: [place] yogurt with honey. Baked apples. Poached pears. Trail mix. Seasoning and other foods: Basil. Cilantro. Coriander. Cumin. Mint.  Parsley. Sage. Rosemary. Tarragon. Garlic. Oregano. Thyme. Pepper. Balsalmic vinegar. Tahini. Hummus. Tomato sauce. Olives. Mushrooms. Limit these Grains: Prepackaged pasta or rice dishes. Prepackaged cereal with added sugar. Vegetables: Deep fried potatoes (french fries). Fruits: Fruit canned in syrup. Meats and other protein foods: Beef. Pork. Lamb. Poultry with skin. Hot dogs. [place] Salines. Dairy: Ice cream. Sour cream. Whole milk. Beverages: Juice. Sugar-sweetened soft drinks. Beer. Liquor and spirits. Fats and oils: Butter. Canola oil. Vegetable oil. Beef fat (tallow). Lard. Sweets and desserts: Cookies. Cakes. Pies. Candy. Seasoning and other foods: Mayonnaise. Premade sauces and marinades. The items listed may not be a complete list. Talk with your dietitian about what dietary choices are right for you. Summary The Mediterranean diet includes both food and lifestyle choices. Eat a variety of fresh fruits and vegetables, beans, nuts, seeds, and whole grains. Limit the amount of red meat and sweets that you eat. Talk with your health care provider about whether it is safe for you to drink red wine in moderation. This means 1 glass a day for nonpregnant women and 2 glasses a day for men. A glass of wine equals 5 oz (150 mL). This information is not intended to replace advice given to you by your health care provider. Make sure you discuss any questions you have with your health care provider. Document Released: 07/11/2016 Document Revised: 08/13/2016 Document Reviewed: 07/11/2016 Elsevier Interactive Patient Education  2017 [employer].

## 2022-03-18 ENCOUNTER — Ambulatory Visit: Payer: Medicare Other | Admitting: Physician Assistant

## 2022-03-18 ENCOUNTER — Encounter: Payer: Self-pay | Admitting: Physician Assistant

## 2022-03-18 DIAGNOSIS — G309 Alzheimer's disease, unspecified: Secondary | ICD-10-CM

## 2022-03-18 DIAGNOSIS — F0150 Vascular dementia without behavioral disturbance: Secondary | ICD-10-CM

## 2022-03-18 DIAGNOSIS — F0280 Dementia in other diseases classified elsewhere without behavioral disturbance: Secondary | ICD-10-CM

## 2022-03-18 MED ORDER — MEMANTINE HCL 10 MG PO TABS: ORAL_TABLET | ORAL | 3 refills | Status: DC

## 2022-03-18 NOTE — Progress Notes (Signed)
? ?Assessment/Plan:  ? ? ?Mixed Alzheimer's and Vascular Dementia  ? ? Recommendations:  ? ?Discussed safety both in and out of the home.  ?Discussed the importance of regular daily schedule with inclusion of crossword puzzles to maintain brain function.  ?Continue to monitor mood by PCP ?Stay active at least 30 minutes at least 3 times a week.  ?Naps should be scheduled and should be no longer than 60 minutes and should not occur after 2 PM.  ?Mediterranean diet is recommended  ?Control cardiovascular risk factors  ?Referral to speech therapy ?Continue memantine 10 mg bid  Side effects were discussed ?Follow up in 6  months. ? ? ?Case discussed with Dr. Delice Lesch who agrees with the plan ? ? ? ? ?Subjective:  ? ? ?Rodney Christensen is a very pleasant 81 y.o. RH male  seen today in follow up for memory loss. This patient is accompanied in the office by his who supplements the history.  Previous records as well as any outside records available were reviewed prior to todays visit.  Patient was last seen at our office on 09/26/21 at which time his MoCa blind was 12/22. MRI of the brain without contrast on 09/01/2021 remarkable for moderate cerebral atrophy, small chronic left parietal infarct, without any acute intracranial abnormalities. He is on memantine 10 mg bid, tolerating well.  ?Since his last visit he reports that his memory is about the same. He continues to repeat himself but "it's not worse". However he wonders if there is any speech therapy that can help him with exercises to improve verbal abilities and fluency.  His mood is good, denies depression or irritability.  He likes to watch TV, baseball and movies, no longer using the computer game because of poor vision. He sleeps better than prior, "maybe is the memantine"-his wife says. He still wakes up to urinate but then returns to bed and has no difficulties falling asleep. He denies any vivid dreams, or sleepwalking.  He denies any hallucinations or paranoia.   He denies leaving objects in unusual places.  He needs some assistance with bathing and dressing. Wife administers the medication and manages the finances.  His appetite is "okay", eating 3 meals a day, denies trouble swallowing.  He does not cook, but can make a sandwich.  Ambulates without a walker, unless he needs it, denies any falls or head injuries.  He stopped driving in the [place] due to eyesight difficulties.  He denies any headaches, dizziness, focal numbness or tingling, unilateral weakness or tremors. 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 No urine or bowel complaints.. ?  ?Initial visit 09/26/21 The patient is seen in neurologic consultation at the request of [name], MD for the evaluation of memory.  The patient is accompanied by his wife who supplements the history. ?This is a 81 y.o. year old male who has had memory issues for about 3-1/2 years, worse over the last 2 years, especially during the [place] pandemic.  He repeats the same questions, sometimes he "does not know where he is ".  He also repeats the same sentences.  He lives with his wife, who noticed these changes as well.  His mood is good, denies depression or irritability.  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  He "reads very little "because he is eyesight is very poor.  He has issues with insomnia, sleeping 1 hour at the time, "gets up and  goes to the bathroom, and a good night he will wake up once ".  He sleeps about 6 hours.  He denies any vivid dreams, or sleepwalking.  He denies any hallucinations or paranoia.  He denies leaving objects in unusual places.  He is independent of bathing and dressing.  Wife administers the medication and manages the finances.  His appetite is "okay", eating 3 meals a day, denies trouble swallowing.  He does not cook, but can make a sandwich. ?Ambulates  without a walker, unless needed, denies any falls or head injuries.  He  stopped driving in the [place] due to eyesight difficulties.  He denies any headaches, dizziness, focal numbness or tingling, unilateral weakness or tremors.  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  No history of seizures.  Denies constipation or diarrhea.  He has a history of urine incontinence.  He denies a history of sleep apnea, alcohol or tobacco.  Family history remarkable for aunt with dementia of unknown type. ?  ?  ?MRI brain wo contrast 09/01/21  ?1. No acute intracranial abnormality. ?2. Progressive, moderate cerebral atrophy. ?3. Small chronic left parietal infarct. ?  ?B12 and TSH at the PCPs office were normal ? ? ?PREVIOUS MEDICATIONS:  ? ?CURRENT MEDICATIONS:  ?Outpatient Encounter Medications as of 03/18/2022  ?Medication Sig  ? amLODipine (NORVASC) 10 MG tablet Take 10 mg by mouth daily.  ? bimatoprost (LUMIGAN) 0.03 % ophthalmic drops Place 1 drop into both eyes at bedtime.    ? Calcium-Magnesium-Zinc (CAL-MAG-ZINC PO) Take by mouth as directed.    ? dorzolamide (TRUSOPT) 2 % ophthalmic solution Place 1 drop into both eyes 2 (two) times daily as needed.  ? dorzolamide-timolol (COSOPT) 22.3-6.8 MG/ML ophthalmic solution Place 1 drop into the left eye at bedtime.  ? doxazosin (CARDURA) 8 MG tablet Take 8 mg by mouth at bedtime.    ? folic acid (FOLVITE) 1 MG tablet Take 1 mg by mouth daily.    ? Garlic 0786 MG CAPS Take by mouth daily.  ? lisinopril (PRINIVIL,ZESTRIL) 40 MG tablet Take 40 mg by mouth 2 (two) times daily.   ? LUTEIN PO Take by mouth 2 (two) times daily.    ? metoprolol (LOPRESSOR) 50 MG tablet Take 50 mg by mouth 2 (two) times daily.    ? potassium chloride SA (K-DUR,KLOR-CON) 20 MEQ tablet   ? simvastatin (ZOCOR) 20 MG tablet Take 20 mg by mouth every evening.  ? torsemide (DEMADEX) 20 MG tablet 20 mg daily.   ? [DISCONTINUED] memantine (NAMENDA) 10 MG tablet Take 1 tablet (10 mg at night) for 2 weeks, then increase to 1 tablet (10 mg) twice a day  ? Ascorbic Acid (VITAMIN  C) 500 MG tablet Take 500 mg by mouth daily.  (Patient not taking: Reported on 09/17/2021)  ? B Complex Vitamins (B COMPLEX 100 PO) Take by mouth daily.   (Patient not taking: Reported on 09/17/2021)  ? beta carotene 25000 UNIT capsule Take 25,000 Units by mouth daily.   (Patient not taking: Reported on 09/17/2021)  ? Bilberry 500 MG CAPS Take 500 mg by mouth daily.  (Patient not taking: Reported on 09/17/2021)  ? Chelated Zinc 50 MG TABS Take by mouth daily.   (Patient not taking: Reported on 09/17/2021)  ? Cinnamon 500 MG capsule Take 1,000 mg by mouth daily.  (Patient not taking: Reported on 09/17/2021)  ? Coenzyme Q10 (CO Q 10) 100 MG CAPS Take by mouth daily.   (Patient not taking:  Reported on 09/17/2021)  ? fish oil-omega-3 fatty acids 1000 MG capsule Take 2 g by mouth 3 (three) times daily.   (Patient not taking: Reported on 09/17/2021)  ? Ginkgo Biloba 40 MG TABS Take by mouth daily.   (Patient not taking: Reported on 09/17/2021)  ? Melatonin 1 MG CAPS Take by mouth at bedtime.   (Patient not taking: Reported on 09/17/2021)  ? memantine (NAMENDA) 10 MG tablet Take 1 tablet (10 mg) twice a day  ? Multiple Vitamin (MULTIVITAMIN) tablet Take 1 tablet by mouth daily.   (Patient not taking: Reported on 09/17/2021)  ? ?No facility-administered encounter medications on file as of 03/18/2022.  ? ? ? ?Objective:  ?  ? ?PHYSICAL EXAMINATION:   ? ?VITALS:  There were no vitals filed for this visit. ? ?GEN:  The patient appears stated age and is in NAD. ?HEENT:  Normocephalic, atraumatic.  ? ?Neurological examination: ? ?General: NAD, well-groomed, appears stated age. ?Orientation: The patient is alert. Oriented to person, place not to date ?Cranial nerves: There is good facial symmetry. R lateral deviation of the eye. The speech is fluent and clear. No aphasia or dysarthria. Fund of knowledge is appropriate. Recent and remote memory are impaired. Attention and concentration are reduced.  Able to name objects and repeat  phrases.  Hearing is intact to conversational tone.    ?Sensation: Sensation is intact to light touch throughout ?Motor: Strength is at least antigravity x4. ?Tremors: none  ?DTR's 2/4 in UE/LE  ? ? ?  10

## 2022-03-18 NOTE — Patient Instructions (Addendum)
It was a pleasure to see you today at our office.  ? ?Recommendations: ? ?Continue Memantine 10mg  tablets 1 tablet twice daily.  ?Follow up in 6 months ?Referral to Speech therapy  ? ?RECOMMENDATIONS FOR ALL PATIENTS WITH MEMORY PROBLEMS: ?1. Continue to exercise (Recommend 30 minutes of walking everyday, or 3 hours every week) ?2. Increase social interactions - continue going to [place] and enjoy social gatherings with friends and family ?3. Eat healthy, avoid fried foods and eat more fruits and vegetables ?4. Maintain adequate blood pressure, blood sugar, and blood cholesterol level. Reducing the risk of stroke and cardiovascular disease also helps promoting better memory. ?5. Avoid stressful situations. Live a simple life and avoid aggravations. Organize your time and prepare for the next day in anticipation. ?6. Sleep well, avoid any interruptions of sleep and avoid any distractions in the bedroom that may interfere with adequate sleep quality ?7. Avoid sugar, avoid sweets as there is a strong link between excessive sugar intake, diabetes, and cognitive impairment ?We discussed the Mediterranean diet, which has been shown to help patients reduce the risk of progressive memory disorders and reduces cardiovascular risk. This includes eating fish, eat fruits and green leafy vegetables, nuts like almonds and hazelnuts, walnuts, and also use olive oil. Avoid fast foods and fried foods as much as possible. Avoid sweets and sugar as sugar use has been linked to worsening of memory function. ? ?There is always a concern of gradual progression of memory problems. If this is the case, then we may need to adjust level of care according to patient needs. Support, both to the patient and caregiver, should then be put into place.  ? ? ?FALL PRECAUTIONS: Be cautious when walking. Scan the area for obstacles that may increase the risk of trips and falls. When getting up in the mornings, sit up at the edge of the bed for a few  minutes before getting out of bed. Consider elevating the bed at the head end to avoid drop of blood pressure when getting up. Walk always in a well-lit room (use night lights in the walls). Avoid area rugs or power cords from appliances in the middle of the walkways. Use a walker or a cane if necessary and consider physical therapy for balance exercise. Get your eyesight checked regularly. ? ?  ? ?HOME SAFETY: Consider the safety of the kitchen when operating appliances like stoves, microwave oven, and blender. Consider having supervision and share cooking responsibilities until no longer able to participate in those. Accidents with firearms and other hazards in the house should be identified and addressed as well. ? ? ?ABILITY TO BE LEFT ALONE: If patient is unable to contact 911 operator, consider using LifeLine, or when the need is there, arrange for someone to stay with patients. Smoking is a fire hazard, consider supervision or cessation. Risk of wandering should be assessed by caregiver and if detected at any point, supervision and safe proof recommendations should be instituted. ? ?MEDICATION SUPERVISION: Inability to self-administer medication needs to be constantly addressed. Implement a mechanism to ensure safe administration of the medications. ? ?  ? ? ? ?Mediterranean Diet ?A Mediterranean diet refers to food and lifestyle choices that are based on the traditions of countries located on the [employer]. This way of eating has been shown to help prevent certain conditions and improve outcomes for people who have chronic diseases, like kidney disease and heart disease. ?What are tips for following this plan? ?Lifestyle  ?Cook and eat  meals together with your family, when possible. ?Drink enough fluid to keep your urine clear or pale yellow. ?Be physically active every day. This includes: ?Aerobic exercise like running or swimming. ?Leisure activities like gardening, walking, or housework. ?Get 7-8  hours of sleep each night. ?If recommended by your health care provider, drink red wine in moderation. This means 1 glass a day for nonpregnant women and 2 glasses a day for men. A glass of wine equals 5 oz (150 mL). ?Reading food labels  ?Check the serving size of packaged foods. For foods such as rice and pasta, the serving size refers to the amount of cooked product, not dry. ?Check the total fat in packaged foods. Avoid foods that have saturated fat or trans fats. ?Check the ingredients list for added sugars, such as corn syrup. ?Shopping  ?At the grocery store, buy most of your food from the areas near the walls of the store. This includes: ?Fresh fruits and vegetables (produce). ?Grains, beans, nuts, and seeds. Some of these may be available in unpackaged forms or large amounts (in bulk). ?Fresh seafood. ?Poultry and eggs. ?Low-fat dairy products. ?Buy whole ingredients instead of prepackaged foods. ?Buy fresh fruits and vegetables in-season from local farmers markets. ?Buy frozen fruits and vegetables in resealable bags. ?If you do not have access to quality fresh seafood, buy precooked frozen shrimp or canned fish, such as tuna, salmon, or sardines. ?Buy small amounts of raw or cooked vegetables, salads, or olives from the deli or salad bar at your store. ?Stock your pantry so you always have certain foods on hand, such as olive oil, canned tuna, canned tomatoes, rice, pasta, and beans. ?Cooking  ?Cook foods with extra-virgin olive oil instead of using butter or other vegetable oils. ?Have meat as a side dish, and have vegetables or grains as your main dish. This means having meat in small portions or adding small amounts of meat to foods like pasta or stew. ?Use beans or vegetables instead of meat in common dishes like chili or lasagna. ?Experiment with different cooking methods. Try roasting or broiling vegetables instead of steaming or saut?eing them. ?Add frozen vegetables to soups, stews, pasta, or  rice. ?Add nuts or seeds for added healthy fat at each meal. You can add these to yogurt, salads, or vegetable dishes. ?Marinate fish or vegetables using olive oil, lemon juice, garlic, and fresh herbs. ?Meal planning  ?Plan to eat 1 vegetarian meal one day each week. Try to work up to 2 vegetarian meals, if possible. ?Eat seafood 2 or more times a week. ?Have healthy snacks readily available, such as: ?Vegetable sticks with hummus. ?[place] yogurt. ?Fruit and nut trail mix. ?Eat balanced meals throughout the week. This includes: ?Fruit: 2-3 servings a day ?Vegetables: 4-5 servings a day ?Low-fat dairy: 2 servings a day ?Fish, poultry, or lean meat: 1 serving a day ?Beans and legumes: 2 or more servings a week ?Nuts and seeds: 1-2 servings a day ?Whole grains: 6-8 servings a day ?Extra-virgin olive oil: 3-4 servings a day ?Limit red meat and sweets to only a few servings a month ?What are my food choices? ?Mediterranean diet ?Recommended ?Grains: Whole-grain pasta. Brown rice. Bulgar wheat. Polenta. Couscous. Whole-wheat bread. [place]. ?Vegetables: Artichokes. Beets. Broccoli. Cabbage. Carrots. Eggplant. Green beans. Chard. Kale. Spinach. Onions. Leeks. Peas. Squash. Tomatoes. Peppers. Radishes. ?Fruits: Apples. Apricots. Avocado. Berries. Bananas. Cherries. Dates. Figs. Grapes. Lemons. Melon. Oranges. Peaches. Plums. Pomegranate. ?Meats and other protein foods: Beans. Almonds. Sunflower seeds. Pine nuts. Peanuts.  [place]. [place]. Scallops. Shrimp. [place]. Tilapia. Clams. Oysters. Eggs. ?Dairy: Low-fat milk. Cheese. Greek yogurt. ?Beverages: Water. Red wine. Herbal tea. ?Fats and oils: Extra virgin olive oil. Avocado oil. Grape seed oil. ?Sweets and desserts: [place] yogurt with honey. Baked apples. Poached pears. Trail mix. ?Seasoning and other foods: Basil. Cilantro. Coriander. Cumin. Mint. Parsley. Sage. Rosemary. Tarragon. Garlic. Oregano. Thyme. Pepper. Balsalmic vinegar. Tahini. Hummus. Tomato sauce. Olives.  Mushrooms. ?Limit these ?Grains: Prepackaged pasta or rice dishes. Prepackaged cereal with added sugar. ?Vegetables: Deep fried potatoes (french fries). ?Fruits: Fruit canned in syrup. ?Meats and other protei

## 2022-03-26 ENCOUNTER — Ambulatory Visit: Payer: Medicare Other | Attending: Physician Assistant | Admitting: Speech Pathology

## 2022-03-26 DIAGNOSIS — R41841 Cognitive communication deficit: Secondary | ICD-10-CM | POA: Insufficient documentation

## 2022-03-26 NOTE — Therapy (Signed)
?OUTPATIENT SPEECH LANGUAGE PATHOLOGY EVALUATION ? ? ?Patient Name: Rodney Christensen ?MRN: [id number] ?DOB:1941-01-18, 81 y.o., male ?Today's Date: 03/27/2022 ? ?PCP: [name], MD ?REFERRING PROVIDER: Rondel Jumbo, PA-C ? ? End of Session - 03/26/22 1631   ? ? Visit Number 1   ? Number of Visits 17   ? Date for SLP Re-Evaluation 05/21/22   ? Authorization Type [employer] Medicare   ? Progress Note Due on Visit 10   ? SLP Start Time 0930   ? SLP Stop Time  1013   ? SLP Time Calculation (min) 43 min   ? Activity Tolerance Patient tolerated treatment well   ? ?  ?  ? ?  ? ? ?Past Medical History:  ?Diagnosis Date  ? BPH (benign prostatic hyperplasia)   ? CAD, NATIVE VESSEL 03/22/2010  ? 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  ? Diverticulosis   ? History of radiation therapy 03/18/11 to 04/19/11  ? lower left lung  ? HYPERLIPIDEMIA-MIXED 03/17/2009  ? HYPERTENSION, UNSPECIFIED 03/17/2009  ? Macular degeneration   ? Skin cancer   ? melanoma-nose  ? 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 Left lower lobe   ? ?Past Surgical History:  ?Procedure Laterality Date  ? BRONCHOSCOPY  06/11/11  ? [facility]  ? CAROTID ENDARTERECTOMY    ? CORONARY ARTERY BYPASS GRAFT  10/10/2004  ? x5  ? Lt VATS,Lt thoacotomy,Lt lower lobectomy with  mediastinal node  dissection  06/11/11  ? [facility]rickson  ? ?Patient Active Problem List  ? Diagnosis Date Noted  ? 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 Malignant neoplasm of lower lobe, bronchus, or lung 03/27/2012  ? History of radiation therapy   ? Skin cancer   ? BPH (benign prostatic hyperplasia)   ? Squamous cell carcinoma lung, Left lower lobe   ? CAD, NATIVE VESSEL 03/22/2010  ? HYPERLIPIDEMIA-MIXED 03/17/2009  ? HYPERTENSION, UNSPECIFIED 03/17/2009  ? 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  ? CAROTID ENDARTERECTOMY, LEFT, HX OF 03/17/2009  ? ? ?ONSET DATE: 3 years ago noticing memory/language changes, referral 03/18/2022  ? ?REFERRING DIAG: G30.9,F01.50,F02.80 (ICD-10-CM) - Mixed Alzheimer's  and vascular dementia ([place])  ? ?THERAPY DIAG:  ?Cognitive communication deficit ? ?SUBJECTIVE:  ? ?SUBJECTIVE STATEMENT: ?"I'm probably one of the most frustrated people who can't remember."  ?Pt accompanied by: significant other, Marlene ? ?PERTINENT HISTORY: 81 y.o. year old male who has had memory issues for about 4 years, worse over the last 2 years, especially during the [place] pandemic.  He repeats the same questions, sometimes he "does not know where he is ".  He also repeats the same sentences.  He lives with his wife, who noticed these changes as well.  His mood is good, denies depression or irritability.  ? ?PAIN:  ?Are you having pain? No ? ?FALLS: Has patient fallen in last 6 months?  Yes, Comment: about monthly ? ?LIVING ENVIRONMENT: ?Lives with: lives with their spouse ?Lives in: House/apartment ? ?PLOF:  ?Level of assistance: Needed assistance with ADLs, Needed assistance with IADLS ?Employment: Retired ? ? ?PATIENT GOALS "help with his memory" -wife ? ?OBJECTIVE:  ? ?DIAGNOSTIC FINDINGS: MRI of the brain without contrast on 09/01/2021 remarkable for moderate cerebral atrophy, small chronic left parietal infarct, without any acute intracranial abnormalities.  ? ?COGNITION: ?Overall cognitive status: Impaired ?Attention: Impaired: Sustained, Selective, Alternating, Divided ?Memory: Impaired: Short term ?Long term ?Procedural ?Prospective ?Auditory ?Awareness: WFL ?Executive function: Impaired: Initiation, Problem solving, Organization, Planning, Self-correction, and Slow processing ?Behavior:  Within functional limits ?Functional deficits: significant memory impairment impacting orientation, explicit and prospective memories. Wife is managing medications, finances,  ?schedule. Reports impaired ability to remember familiar people, significant life events. ?Comments: followed by [facility], dx with dementia.  ? ?COGNITIVE COMMUNICATION ?Following directions: Follows one step commands consistently   ?Auditory comprehension: Impaired: 2/2 attention ?Verbal expression: Impaired: some word finding exhibited, potentially 2/2 cognitive deficits  ?Functional communication: WFL ?COGNITION: ?Overall cognitive status: Impaired ?Areas of impairment: Orientation, Attention, Memory, and Problem solving ?Functional deficits:    ? ? ?AUDITORY COMPREHENSION: ?Overall auditory comprehension: Appears intact ? ?READING COMPREHENSION: not assessed, pt is legally blind  ? ?EXPRESSION: verbal, Appears intact for simple to mod-complex conversation ? ?TODAY'S TREATMENT:  ?Education provided on role of ST for cognitive impairment. Evaluation results and ST recommendations. Education on POC and goals.  ? ? ?PATIENT EDUCATION: ?Education details: see above ?Person educated: Patient and Spouse ?Education method: Explanation, Demonstration, and Handouts ?Education comprehension: verbalized understanding, returned demonstration, and needs further education ? ? ?GOALS: ?Goals reviewed with patient? Yes ? ?SHORT TERM GOALS: Target date: 04/24/2022 ? ?Pt and care-partner will implement x2 external aids to facilitate orientation to time over 2 sessions.  ?Baseline: ?Goal status: INITIAL ? ?2.  Pt and care-partner will initiate generation of memory book to assist pt in recalling important people and events with mod-I  ?Baseline:  ?Goal status: INITIAL ? ?3.  Pt will use memory aid to accurately answer personally relevant questions with 80% accuracy over 2 visits.  ?Baseline:  ?Goal status: INITIAL ? ? ?LONG TERM GOALS: Target date: 05/22/2022 ? ?Pt will report subjective increase in confidence in ability to discern accuracy of recalled events with aid of memory book, with occasional mod-A.  ?Baseline:  ?Goal status: INITIAL ? ?2.  Pt and care partners will use external memory aids or memory book to recall personally relevant places, names, trips, events etc over 4 sessions ?Baseline:  ?Goal status: INITIAL ? ? ?ASSESSMENT: ? ?CLINICAL  IMPRESSION: ?Patient is a 81 y.o. M who was seen today for cognitive communication deficits 2/2 dx of dementia. Pt and spouse primarily concerned regarding pt's declining memory. Pt tells ST he has no "time reference" and thus is unsure of when deficits began. Wife reports ongoing since 2017, with worsening memory during pandemic when they were "stuck at home." Pt interacts appropriately in conversations but is very unsure of himself, tells ST "I have this memory but I don't know if I'm making it up." Pt with significant visual impairment, is legally blind. Currently using magnifying glass with little success. D/t significant cognitive impairment, and dementia dx, standardized assessment not indicated at this time. During assessment, unable to generate year. Unable to tell month despite wife reporting daily orientation practice using home calendar. Pt endorses frustration stemming from memory impairment. I recommend skilled ST to maximize cognition, train caregivers in compensatory strategies to support memory in daily activities and use of external memory aids for safety and to reduce caregiver burden. ? ?OBJECTIVE IMPAIRMENTS include attention, memory, awareness, and executive functioning. These impairments are limiting patient from ADLs/IADLs and effectively communicating at home and in community. ?Factors affecting potential to achieve goals and functional outcome are medical prognosis and previous level of function. Patient will benefit from skilled SLP services to address above impairments and improve overall function. ? ?REHAB POTENTIAL: Fair d/t prognosis ? ?PLAN: ?SLP FREQUENCY: 1-2x/week - will initiate at 1x per week, will adjust to 2x per week if indicated based on carryover of  implemented strategies  ? ?SLP DURATION: 8 weeks ? ?PLANNED INTERVENTIONS: Language facilitation, Cueing hierachy, Cognitive reorganization, Internal/external aids, Functional tasks, Multimodal communication approach, SLP  instruction and feedback, Compensatory strategies, and Patient/family education ? ? ? ?Su Monks, CCC-SLP ?03/27/2022, 4:06 PM ? ? ? ?  ?

## 2022-04-03 NOTE — Therapy (Signed)
?OUTPATIENT SPEECH LANGUAGE PATHOLOGY TREATMENT NOTE ? ? ?Patient Name: Rodney Christensen ?MRN: [id number] ?DOB:[birth year], 81 y.o., male ?Today's Date: 04/04/2022 ? ?PCP: [name], MD ?REFERRING PROVIDER: Rondel Jumbo, PA-C ? ?END OF SESSION:  ? End of Session - 04/04/22 1449   ? ? Visit Number 2   ? Number of Visits 17   ? Date for SLP Re-Evaluation 05/21/22   ? Authorization Type [employer] Medicare   ? Progress Note Due on Visit 10   ? SLP Start Time 1447   ? SLP Stop Time  1530   ? SLP Time Calculation (min) 43 min   ? Activity Tolerance Patient tolerated treatment well   ? ?  ?  ? ?  ? ? ?Past Medical History:  ?Diagnosis Date  ? BPH (benign prostatic hyperplasia)   ? CAD, NATIVE VESSEL 03/22/2010  ? 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  ? Diverticulosis   ? History of radiation therapy 03/18/11 to 04/19/11  ? lower left lung  ? HYPERLIPIDEMIA-MIXED 03/17/2009  ? HYPERTENSION, UNSPECIFIED 03/17/2009  ? Macular degeneration   ? Skin cancer   ? melanoma-nose  ? 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 Left lower lobe   ? ?Past Surgical History:  ?Procedure Laterality Date  ? BRONCHOSCOPY  06/11/11  ? [facility]  ? CAROTID ENDARTERECTOMY    ? CORONARY ARTERY BYPASS GRAFT  10/10/2004  ? x5  ? Lt VATS,Lt thoacotomy,Lt lower lobectomy with  mediastinal node  dissection  06/11/11  ? [facility]rickson  ? ?Patient Active Problem List  ? Diagnosis Date Noted  ? Mixed Alzheimer's and vascular dementia ([place]) 09/17/2021  ? Malignant neoplasm of lower lobe, bronchus, or lung 03/27/2012  ? History of radiation therapy   ? Skin cancer   ? BPH (benign prostatic hyperplasia)   ? Squamous cell carcinoma lung, Left lower lobe   ? CAD, NATIVE VESSEL 03/22/2010  ? HYPERLIPIDEMIA-MIXED 03/17/2009  ? HYPERTENSION, UNSPECIFIED 03/17/2009  ? 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  ? CAROTID ENDARTERECTOMY, LEFT, HX OF 03/17/2009  ? ? ?ONSET DATE: 3 years ago noticing memory/language changes, referral 03/18/2022  ?  ?REFERRING DIAG: G30.9,F01.50,F02.80 (ICD-10-CM)  - Mixed Alzheimer's and vascular dementia ([place])  ? ?THERAPY DIAG:  ?Cognitive communication deficit ? ?SUBJECTIVE: "hard to tell" ? ?PAIN:  ?Are you having pain? No ? ?OBJECTIVE:  ? ?TODAY'S TREATMENT:  ?04-04-2022: Pt demonstrates continued frustration with memory impairment, telling ST "It's all lost in there, I can't get it back." Discussion on creating memory/orientation book to aid pt in recall of important, personally relevant events, people, and experiences. [name] able and willing to provide information for memory book. orientationly pt appears reluctant but after discussion tells ST he's be open to trying. Tells ST connecting with people since memory being impaired is scary. ST validates feelings explains how this process can potentially provide pt with assistance and support to engage in meaningful connections with those he care about. Pt agreeable, will schedule further visits with ST.  ?  ?  ?PATIENT EDUCATION: ?Education details: see above ?Person educated: Patient and Spouse ?Education method: Explanation, Demonstration, and Handouts ?Education comprehension: verbalized understanding, returned demonstration, and needs further education ?  ?  ?GOALS: ?Goals reviewed with patient? Yes ?  ?SHORT TERM GOALS: Target date: 04/24/2022 ?  ?Pt and care-partner will implement x2 external aids to facilitate orientation to time over 2 sessions.  ?Baseline: ?Goal status: orientation ?  ?2.  Pt and care-partner will initiate generation of memory book to assist pt  in recalling important people and events with mod-I  ?Baseline:  ?Goal status: orientation ?  ?3.  Pt will use memory aid to accurately answer personally relevant questions with 80% accuracy over 2 visits.  ?Baseline:  ?Goal status: orientation ?  ?  ?LONG TERM GOALS: Target date: 05/22/2022 ?  ?Pt will report subjective increase in confidence in ability to discern accuracy of recalled events with aid of memory book, with occasional mod-A.  ?Baseline:  ?Goal  status: orientation ?  ?2.  Pt and care partners will use external memory aids or memory book to recall personally relevant places, names, trips, events etc over 4 sessions ?Baseline:  ?Goal status: orientation ?  ?  ?ASSESSMENT: ?  ?CLINICAL IMPRESSION: ?Patient is a 81 y.o. M who was seen today for cognitive communication deficits 2/2 dx of dementia. Pt and spouse primarily concerned regarding pt's declining memory. Pt tells ST he has no "time reference" and thus is unsure of when deficits began. Wife reports ongoing since 2017, with worsening memory during pandemic when they were "stuck at home." Pt interacts appropriately in conversations but is very unsure of himself, tells ST "I have this memory but I don't know if I'm making it up." Pt with significant visual impairment, is legally blind. Currently using magnifying glass with little success. D/t significant cognitive impairment, and dementia dx, standardized assessment not indicated at this time. During assessment, unable to generate year. Unable to tell month despite wife reporting daily orientation practice using home calendar. Pt endorses frustration stemming from memory impairment. I recommend skilled ST to maximize cognition, train caregivers in compensatory strategies to support memory in daily activities and use of external memory aids for safety and to reduce caregiver burden. ?  ?OBJECTIVE IMPAIRMENTS include attention, memory, awareness, and executive functioning. These impairments are limiting patient from ADLs/IADLs and effectively communicating at home and in community. ?Factors affecting potential to achieve goals and functional outcome are medical prognosis and previous level of function. Patient will benefit from skilled SLP services to address above impairments and improve overall function. ?  ?REHAB POTENTIAL: Fair d/t prognosis ?  ?PLAN: ?SLP FREQUENCY: 1-2x/week - will initiate at 1x per week, will adjust to 2x per week if indicated based  on carryover of implemented strategies  ?  ?SLP DURATION: 8 weeks ?  ?PLANNED INTERVENTIONS: Language facilitation, Cueing hierachy, Cognitive reorganization, Internal/external aids, Functional tasks, Multimodal communication approach, SLP instruction and feedback, Compensatory strategies, and Patient/family education ? ?Su Monks, CCC-SLP ?04/04/2022, 3:57 PM ?  ?

## 2022-04-04 ENCOUNTER — Ambulatory Visit: Payer: Medicare Other | Attending: Physician Assistant | Admitting: Speech Pathology

## 2022-04-04 DIAGNOSIS — R41841 Cognitive communication deficit: Secondary | ICD-10-CM | POA: Insufficient documentation

## 2022-04-09 ENCOUNTER — Ambulatory Visit: Payer: Medicare Other | Admitting: Speech Pathology

## 2022-04-09 DIAGNOSIS — R41841 Cognitive communication deficit: Secondary | ICD-10-CM

## 2022-04-09 NOTE — Patient Instructions (Signed)
Rodney Christensen's wedding day:  ?-Wonderful day ?-Walked her down the [place] ?-saving silver coins and investing in silver bars, used to pay for her marriage  ?-silver went wild just as I was about to cash it in  ?-she is still married to him, Rodney Christensen ?-He lived in [place], then they moved to [place]  ?-Had the reception at [employer] ?-The inn took care of everything, made the party happen ?-Big wedding, all the family was there, over 6 people ?-Outdoor wedding ?-In the fall, beautiful weather ?-Married in 2007 ?-Son-in-law's sister had baby around this time ?

## 2022-04-09 NOTE — Therapy (Signed)
?OUTPATIENT SPEECH LANGUAGE PATHOLOGY TREATMENT NOTE ? ? ?Patient Name: Rodney Christensen ?MRN: [id number] ?DOB:Dec 04, 1940, 81 y.o., male ?Today's Date: 04/09/2022 ? ?PCP: [name], MD ?REFERRING PROVIDER: Rondel Jumbo, PA-C ? ?END OF SESSION:  ? End of Session - 04/09/22 1449   ? ? Visit Number 3   ? Number of Visits 17   ? Date for SLP Re-Evaluation 05/21/22   ? Authorization Type [employer]HC Medicare   ? Progress Note Due on Visit 10   ? SLP Start Time 7544   ? SLP Stop Time  1530   ? SLP Time Calculation (min) 41 min   ? Activity Tolerance Patient tolerated treatment well   ? ?  ?  ? ?  ? ? ?Past Medical History:  ?Diagnosis Date  ? BPH (benign prostatic hyperplasia)   ? CAD, NATIVE VESSEL 03/22/2010  ? 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  ? Diverticulosis   ? History of radiation therapy 03/18/11 to 04/19/11  ? lower left lung  ? HYPERLIPIDEMIA-MIXED 03/17/2009  ? HYPERTENSION, UNSPECIFIED 03/17/2009  ? Macular degeneration   ? Skin cancer   ? melanoma-nose  ? 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 Left lower lobe   ? ?Past Surgical History:  ?Procedure Laterality Date  ? BRONCHOSCOPY  06/11/11  ? [facility]  ? CAROTID ENDARTERECTOMY    ? CORONARY ARTERY BYPASS GRAFT  10/10/2004  ? x5  ? Lt VATS,Lt thoacotomy,Lt lower lobectomy with  mediastinal node  dissection  06/11/11  ? [facility]rickson  ? ?Patient Active Problem List  ? Diagnosis Date Noted  ? Mixed Alzheimer's and vascular dementia ([place]) 09/17/2021  ? Malignant neoplasm of lower lobe, bronchus, or lung 03/27/2012  ? History of radiation therapy   ? Skin cancer   ? BPH (benign prostatic hyperplasia)   ? Squamous cell carcinoma lung, Left lower lobe   ? CAD, NATIVE VESSEL 03/22/2010  ? HYPERLIPIDEMIA-MIXED 03/17/2009  ? HYPERTENSION, UNSPECIFIED 03/17/2009  ? 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  ? CAROTID ENDARTERECTOMY, LEFT, HX OF 03/17/2009  ? ? ?ONSET DATE: 3 years ago noticing memory/language changes, referral 03/18/2022  ?  ?REFERRING DIAG: G30.9,F01.50,F02.80 (ICD-10-CM)  - Mixed Alzheimer's and vascular dementia ([place])  ? ?THERAPY DIAG:  ?Cognitive communication deficit ? ?SUBJECTIVE: "hard to tell" ? ?PAIN:  ?Are you having pain? No ? ?OBJECTIVE:  ? ?TODAY'S TREATMENT:  ?04-09-2022: Target recall of important memorable event to Rodney Christensen, daughter's wedding. Usual doubt regarding accuracy of memories, Rodney Christensen able to verify and encourage Salam through task. Pt endorses appreciation for task, tells ST he'd like to move forward with creating memory book of other memories. Provide education on use of pictures and videos to support recall and construction of memory book. Rodney Christensen tells ST she will look for appropriate photos. Rodney Christensen to keep track of stories he enjoys telling daughter next time they have lengthy discussion.  ? ?04-04-2022: Pt demonstrates continued frustration with memory impairment, telling ST "It's all lost in there, I can't get it back." Discussion on creating memory/orientation book to aid pt in recall of important, personally relevant events, people, and experiences. Rodney Christensen able and willing to provide information for memory book. orientationly pt appears reluctant but after discussion tells ST he's be open to trying. Tells ST connecting with people since memory being impaired is scary. ST validates feelings explains how this process can potentially provide pt with assistance and support to engage in meaningful connections with those he care about. Pt agreeable, will schedule further visits with ST.  ?  ?  ?  PATIENT EDUCATION: ?Education details: see above ?Person educated: Patient and Spouse ?Education method: Explanation, Demonstration, and Handouts ?Education comprehension: verbalized understanding, returned demonstration, and needs further education ?  ?  ?GOALS: ?Goals reviewed with patient? Yes ?  ?SHORT TERM GOALS: Target date: 04/24/2022 ?  ?Pt and care-partner will implement x2 external aids to facilitate orientation to time over 2 sessions.  ?Baseline: ?Goal status:  ongoing ?  ?2.  Pt and care-partner will initiate generation of memory book to assist pt in recalling important people and events with mod-I  ?Baseline:  ?Goal status: ongoing ?  ?3.  Pt will use memory aid to accurately answer personally relevant questions with 80% accuracy over 2 visits.  ?Baseline:  ?Goal status: ongoing ?  ?  ?LONG TERM GOALS: Target date: 05/22/2022 ?  ?Pt will report subjective increase in confidence in ability to discern accuracy of recalled events with aid of memory book, with occasional mod-A.  ?Baseline:  ?Goal status: ongoing ?  ?2.  Pt and care partners will use external memory aids or memory book to recall personally relevant places, names, trips, events etc over 4 sessions ?Baseline:  ?Goal status: ongoing ?  ?ASSESSMENT: ?  ?CLINICAL IMPRESSION: ?Patient is a 81 y.o. M who was seen today for cognitive communication deficits 2/2 dx of dementia. Pt and spouse primarily concerned regarding pt's declining memory. Pt tells ST he has no "time reference" and thus is unsure of when deficits began. Wife reports ongoing since 2017, with worsening memory during pandemic when they were "stuck at home." Pt interacts appropriately in conversations but is very unsure of himself, tells ST "I have this memory but I don't know if I'm making it up." Pt with significant visual impairment, is legally blind. Currently using magnifying glass with little success. D/t significant cognitive impairment, and dementia dx, standardized assessment not indicated at this time. During assessment, unable to generate year. Unable to tell month despite wife reporting daily orientation practice using home calendar. Pt endorses frustration stemming from memory impairment. I recommend skilled ST to maximize cognition, train caregivers in compensatory strategies to support memory in daily activities and use of external memory aids for safety and to reduce caregiver burden. ?  ?OBJECTIVE IMPAIRMENTS include attention, memory,  awareness, and executive functioning. These impairments are limiting patient from ADLs/IADLs and effectively communicating at home and in community. ?Factors affecting potential to achieve goals and functional outcome are medical prognosis and previous level of function. Patient will benefit from skilled SLP services to address above impairments and improve overall function. ?  ?REHAB POTENTIAL: Fair d/t prognosis ?  ?PLAN: ?SLP FREQUENCY: 1-2x/week - will initiate at 1x per week, will adjust to 2x per week if indicated based on carryover of implemented strategies  ?  ?SLP DURATION: 8 weeks ?  ?PLANNED INTERVENTIONS: Language facilitation, Cueing hierachy, Cognitive reorganization, Internal/external aids, Functional tasks, Multimodal communication approach, SLP instruction and feedback, Compensatory strategies, and Patient/family education ? ?Su Monks, CCC-SLP ?04/09/2022, 2:51 PM ?  ?

## 2022-04-16 ENCOUNTER — Ambulatory Visit: Payer: Medicare Other | Admitting: Speech Pathology

## 2022-04-16 DIAGNOSIS — R41841 Cognitive communication deficit: Secondary | ICD-10-CM

## 2022-04-16 NOTE — Therapy (Signed)
?OUTPATIENT SPEECH LANGUAGE PATHOLOGY TREATMENT NOTE ? ? ?Patient Name: Rodney Christensen ?MRN: [id number] ?DOB:[birth year], 81 y.o., male ?Today's Date: 04/16/2022 ? ?PCP: Rodney Anchors, MD ?REFERRING PROVIDER: Rondel Jumbo, PA-C ? ?END OF SESSION:  ? End of Session - 04/16/22 1441   ? ? Visit Number 4   ? Number of Visits 17   ? Date for SLP Re-Evaluation 05/21/22   ? Authorization Type [employer] Medicare   ? Progress Note Due on Visit 10   ? SLP Start Time 1829   ? SLP Stop Time  9371   ? SLP Time Calculation (min) 45 min   ? Activity Tolerance Patient tolerated treatment well   ? ?  ?  ? ?  ? ? ?Past Medical History:  ?Diagnosis Date  ? BPH (benign prostatic hyperplasia)   ? CAD, NATIVE VESSEL 03/22/2010  ? 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  ? Diverticulosis   ? History of radiation therapy 03/18/11 to 04/19/11  ? lower left lung  ? HYPERLIPIDEMIA-MIXED 03/17/2009  ? HYPERTENSION, UNSPECIFIED 03/17/2009  ? Macular degeneration   ? Skin cancer   ? melanoma-nose  ? 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 Left lower lobe   ? ?Past Surgical History:  ?Procedure Laterality Date  ? BRONCHOSCOPY  06/11/11  ? [facility]  ? CAROTID ENDARTERECTOMY    ? CORONARY ARTERY BYPASS GRAFT  10/10/2004  ? x5  ? Lt VATS,Lt thoacotomy,Lt lower lobectomy with  mediastinal node  dissection  06/11/11  ? [facility]  ? ?Patient Active Problem List  ? Diagnosis Date Noted  ? 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 Malignant neoplasm of lower lobe, bronchus, or lung 03/27/2012  ? History of radiation therapy   ? Skin cancer   ? BPH (benign prostatic hyperplasia)   ? Squamous cell carcinoma lung, Left lower lobe   ? CAD, NATIVE VESSEL 03/22/2010  ? HYPERLIPIDEMIA-MIXED 03/17/2009  ? HYPERTENSION, UNSPECIFIED 03/17/2009  ? 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  ? CAROTID ENDARTERECTOMY, LEFT, HX OF 03/17/2009  ? ? ?ONSET DATE: 3 years ago noticing memory/language changes, referral 03/18/2022  ?  ?REFERRING DIAG: G30.9,F01.50,F02.80  (ICD-10-CM) - Mixed Alzheimer's and vascular dementia ([place])  ? ?THERAPY DIAG:  ?Cognitive communication deficit ? ?SUBJECTIVE: "I forgot my glasses" ? ?PAIN:  ?Are you having pain? No ? ?OBJECTIVE:  ? ?TODAY'S TREATMENT:  ?04-16-22: Rodney Christensen and Rodney Christensen bring pictures of family to share with Rodney Christensen provides usual modeling of using pictures to talk about what is pictured, the people in the pictures, other connected memories. Education about using photos as a starting point to orient patient and lead through discussions. Education about constructing a book to document relevant, important memories that Rodney Christensen can refer back to. Recommended layout includes simple design with one picture, few key words or phrases. Recommend including orientation information typically forgotten that Rodney Christensen would like to recall.  ? ?04-09-2022: Target recall of important memorable event to Rodney Christensen, daughter's wedding. Usual doubt regarding accuracy of memories, Rodney Christensen able to verify and encourage Rodney Christensen through task. Pt endorses appreciation for task, tells Rodney Christensen he'd like to move forward with creating memory book of other memories. Provide education on use of pictures and videos to support recall and construction of memory book. Rodney Christensen tells Rodney Christensen she will look for appropriate photos. Rodney Christensen to keep track of stories he enjoys telling daughter next time they have lengthy discussion.  ? ?04-04-2022: Pt demonstrates continued frustration with memory impairment, telling Rodney Christensen "It's all lost in there,  I can't get it back." Discussion on creating memory/orientation book to aid pt in recall of important, personally relevant events, people, and experiences. Rodney Christensen able and willing to provide information for memory book. orientationly pt appears reluctant but after discussion tells Rodney Christensen he's be open to trying. Tells Rodney Christensen connecting with people since memory being impaired is scary. Rodney Christensen validates feelings explains how this process can potentially provide pt with assistance  and support to engage in meaningful connections with those he care about. Pt agreeable, will schedule further visits with Rodney Christensen.  ?  ?  ?PATIENT EDUCATION: ?Education details: see above ?Person educated: Patient and Spouse ?Education method: Explanation, Demonstration, and Handouts ?Education comprehension: verbalized understanding, returned demonstration, and needs further education ?  ?  ?GOALS: ?Goals reviewed with patient? Yes ?  ?SHORT TERM GOALS: Target date: 04/24/2022 ?  ?Pt and care-partner will implement x2 external aids to facilitate orientation to time over 2 sessions.  ?Baseline: ?Goal status: ongoing ?  ?2.  Pt and care-partner will initiate generation of memory book to assist pt in recalling important people and events with mod-I  ?Baseline:  ?Goal status: ongoing ?  ?3.  Pt will use memory aid to accurately answer personally relevant questions with 80% accuracy over 2 visits.  ?Baseline:  ?Goal status: ongoing ?  ?  ?LONG TERM GOALS: Target date: 05/22/2022 ?  ?Pt will report subjective increase in confidence in ability to discern accuracy of recalled events with aid of memory book, with occasional mod-A.  ?Baseline:  ?Goal status: ongoing ?  ?2.  Pt and care partners will use external memory aids or memory book to recall personally relevant places, names, trips, events etc over 4 sessions ?Baseline:  ?Goal status: ongoing ?  ?ASSESSMENT: ?  ?CLINICAL IMPRESSION: ?Patient is a 81 y.o. M who was seen today for cognitive communication deficits 2/2 dx of dementia. Pt and spouse primarily concerned regarding pt's declining memory. Pt tells Rodney Christensen he has no "time reference" and thus is unsure of when deficits began. Wife reports ongoing since 2017, with worsening memory during pandemic when they were "stuck at home." Pt interacts appropriately in conversations but is very unsure of himself, tells Rodney Christensen "I have this memory but I don't know if I'm making it up." Pt with significant visual impairment, is legally blind.  Currently using magnifying glass with little success. D/t significant cognitive impairment, and dementia dx, standardized assessment not indicated at this time. During assessment, unable to generate year. Unable to tell month despite wife reporting daily orientation practice using home calendar. Pt endorses frustration stemming from memory impairment. I recommend skilled Rodney Christensen to maximize cognition, train caregivers in compensatory strategies to support memory in daily activities and use of external memory aids for safety and to reduce caregiver burden.  ?  ?OBJECTIVE IMPAIRMENTS include attention, memory, awareness, and executive functioning. These impairments are limiting patient from ADLs/IADLs and effectively communicating at home and in community. ?Factors affecting potential to achieve goals and functional outcome are medical prognosis and previous level of function. Patient will benefit from skilled SLP services to address above impairments and improve overall function. ?  ?REHAB POTENTIAL: Fair d/t prognosis ?  ?PLAN: ?SLP FREQUENCY: 1-2x/week - will initiate at 1x per week, will adjust to 2x per week if indicated based on carryover of implemented strategies  ?  ?SLP DURATION: 8 weeks ?  ?PLANNED INTERVENTIONS: Language facilitation, Cueing hierachy, Cognitive reorganization, Internal/external aids, Functional tasks, Multimodal communication approach, SLP instruction and feedback, Compensatory strategies, and Patient/family  education ? ?Su Monks, [place] ?04/16/2022, 2:43 PM ?  ?

## 2022-04-23 ENCOUNTER — Ambulatory Visit: Payer: Medicare Other | Admitting: Speech Pathology

## 2022-04-23 DIAGNOSIS — R41841 Cognitive communication deficit: Secondary | ICD-10-CM | POA: Diagnosis not present

## 2022-04-23 NOTE — Therapy (Signed)
OUTPATIENT SPEECH LANGUAGE PATHOLOGY TREATMENT NOTE   Patient Name: Rodney Christensen MRN: [id number] DOB:Apr 02, 1941, 81 y.o., male Today's Date: 04/23/2022  PCP: Rodney Anchors, MD REFERRING PROVIDER: Rondel Jumbo, PA-C  END OF SESSION:   End of Session - 04/23/22 1441     Visit Number 5    Number of Visits 17    Date for SLP Re-Evaluation 05/21/22    Authorization Type UHC Medicare    Progress Note Due on Visit 10    SLP Start Time 1445    SLP Stop Time  1538    SLP Time Calculation (min) 53 min    Activity Tolerance Patient tolerated treatment well              Past Medical History:  Diagnosis Date   BPH (benign prostatic hyperplasia)    CAD, NATIVE VESSEL 03/22/2010   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   Diverticulosis    History of radiation therapy 03/18/11 to 04/19/11   lower left lung   HYPERLIPIDEMIA-MIXED 03/17/2009   HYPERTENSION, UNSPECIFIED 03/17/2009   Macular degeneration    Skin cancer    melanoma-nose   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[place])    Left lower lobe    Past Surgical History:  Procedure Laterality Date   BRONCHOSCOPY  06/11/11   [facility]   CAROTID ENDARTERECTOMY     CORONARY ARTERY BYPASS GRAFT  10/10/2004   x5   Lt VATS,Lt thoacotomy,Lt lower lobectomy with  mediastinal node  dissection  06/11/11   [facility]   Patient Active Problem List   Diagnosis Date Noted   Mixed Alzheimer's and vascular dementia (Rodney Christensen) 09/17/2021   Malignant neoplasm of lower lobe, bronchus, or lung 03/27/2012   History of radiation therapy    Skin cancer    BPH (benign prostatic hyperplasia)    Squamous cell carcinoma lung, Left lower lobe    CAD, NATIVE VESSEL 03/22/2010   HYPERLIPIDEMIA-MIXED 03/17/2009   HYPERTENSION, UNSPECIFIED 03/17/2009   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   CAROTID ENDARTERECTOMY, LEFT, HX OF 03/17/2009    ONSET DATE: 3 years ago noticing memory/language changes, referral 03/18/2022    REFERRING DIAG: G30.9,F01.50,F02.80  (ICD-10-CM) - Mixed Alzheimer's and vascular dementia (Rodney Christensen)   THERAPY DIAG:  Cognitive communication deficit  SUBJECTIVE: "things are going good with Rodney Christensen"  PAIN:  Are you having pain? No  OBJECTIVE:   TODAY'S TREATMENT:  04-18-22: Target using conversational exchange to supplement memory book. Target recall of work peers. Able to generate x4 facts about 3 people. Discussed ways to connect with people despite memory impairment. Generated ideas of talking about current family events, move to [place], vacations taken. Able to relay story about work with many details, only min-A from Rodney Christensen regarding accuracy of some (e.g. people's names). Education and modeling on cueing strategies and ? To ask to facilitate additions to pt's memory book.   04-16-22: Rodney Christensen and Rodney Christensen bring pictures of family to share with [name] provides usual modeling of using pictures to talk about what is pictured, the people in the pictures, other connected memories. Education about using photos as a starting point to orient patient and lead through discussions. Education about constructing a book to document relevant, important memories that Rodney Christensen can refer back to. Recommended layout includes simple design with one picture, few key words or phrases. Recommend including orientation information typically forgotten that Rodney Christensen would like to recall.   04-09-2022: Target recall of important memorable event to Rodney Christensen, daughter's wedding. Usual doubt regarding accuracy of memories,  Rodney Christensen able to verify and encourage [name] through task. Pt endorses appreciation for task, tells ST he'd like to move forward with creating memory book of other memories. Provide education on use of pictures and videos to support recall and construction of memory book. Rodney Christensen tells ST she will look for appropriate photos. [name] to keep track of stories he enjoys telling daughter next time they have lengthy discussion.     PATIENT EDUCATION: Education details:  see above Person educated: Patient and Spouse Education method: Explanation, Demonstration, and Handouts Education comprehension: verbalized understanding, returned demonstration, and needs further education     GOALS: Goals reviewed with patient? Yes   SHORT TERM GOALS: Target date: 04/24/2022   Pt and care-partner will implement x2 external aids to facilitate orientation to time over 2 sessions.  Baseline: 04-16-22, 04-23-22 Goal status: met   2.  Pt and care-partner will initiate generation of memory book to assist pt in recalling important people and events with mod-I  Baseline: 04-16-22 Goal status: met   3.  Pt will use memory aid to accurately answer personally relevant questions with 80% accuracy over 2 visits.  Baseline: 04-16-22, 04-23-22 Goal status: partially met     LONG TERM GOALS: Target date: 05/22/2022   Pt will report subjective increase in confidence in ability to discern accuracy of recalled events with aid of memory book, with occasional mod-A.  Baseline:  Goal status: ongoing   2.  Pt and care partners will use external memory aids or memory book to recall personally relevant places, names, trips, events etc over 4 sessions Baseline:  Goal status: ongoing   ASSESSMENT:   CLINICAL IMPRESSION: Patient is a 81 y.o. M who was seen today for cognitive communication deficits 2/2 dx of dementia. Pt and spouse primarily concerned regarding pt's declining memory. Pt tells ST he has no "time reference" and thus is unsure of when deficits began. Wife reports ongoing since 2017, with worsening memory during pandemic when they were "stuck at home." Pt interacts appropriately in conversations but is very unsure of himself, tells ST "I have this memory but I don't know if I'm making it up." Pt with significant visual impairment, is legally blind. Currently using magnifying glass with little success. D/t significant cognitive impairment, and dementia dx, standardized assessment  not indicated at this time. During assessment, unable to generate year. Unable to tell month despite wife reporting daily orientation practice using home calendar. Pt endorses frustration stemming from memory impairment. I recommend skilled ST to maximize cognition, train caregivers in compensatory strategies to support memory in daily activities and use of external memory aids for safety and to reduce caregiver burden.    OBJECTIVE IMPAIRMENTS include attention, memory, awareness, and executive functioning. These impairments are limiting patient from ADLs/IADLs and effectively communicating at home and in community. Factors affecting potential to achieve goals and functional outcome are medical prognosis and previous level of function. Patient will benefit from skilled SLP services to address above impairments and improve overall function.   REHAB POTENTIAL: Fair d/t prognosis   PLAN: SLP FREQUENCY: 1-2x/week - will initiate at 1x per week, will adjust to 2x per week if indicated based on carryover of implemented strategies    SLP DURATION: 8 weeks   PLANNED INTERVENTIONS: Language facilitation, Cueing hierachy, Cognitive reorganization, Internal/external aids, Functional tasks, Multimodal communication approach, SLP instruction and feedback, Compensatory strategies, and Patient/family education  Su Monks, CCC-SLP 04/23/2022, 3:40 PM

## 2022-04-23 NOTE — Patient Instructions (Signed)
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  Would always meet  Had a friendship outside of work   Shep Used to live not too far from me Take me to work, drop me off going home  Weren't that close Relocated to same town and got to know then  [employer] ? 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

## 2022-04-30 ENCOUNTER — Ambulatory Visit: Payer: Medicare Other | Admitting: Speech Pathology

## 2022-04-30 DIAGNOSIS — R41841 Cognitive communication deficit: Secondary | ICD-10-CM

## 2022-04-30 NOTE — Therapy (Signed)
OUTPATIENT SPEECH LANGUAGE PATHOLOGY TREATMENT NOTE   Patient Name: Rodney Christensen MRN: [id number] DOB:November 18, 1941, 81 y.o., male Today's Date: 04/30/2022  PCP: [name], MD REFERRING PROVIDER: Rondel Jumbo, PA-C  END OF SESSION:   End of Session - 04/30/22 1533     Visit Number 6    Number of Visits 17    Date for SLP Re-Evaluation 05/21/22    Authorization Type UHC Medicare    Progress Note Due on Visit 10    SLP Start Time 1533    SLP Stop Time  1615    SLP Time Calculation (min) 42 min    Activity Tolerance Patient tolerated treatment well              Past Medical History:  Diagnosis Date   BPH (benign prostatic hyperplasia)    CAD, NATIVE VESSEL 03/22/2010   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   Diverticulosis    History of radiation therapy 03/18/11 to 04/19/11   lower left lung   HYPERLIPIDEMIA-MIXED 03/17/2009   HYPERTENSION, UNSPECIFIED 03/17/2009   Macular degeneration    Skin cancer    melanoma-nose   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[place])    Left lower lobe    Past Surgical History:  Procedure Laterality Date   BRONCHOSCOPY  06/11/11   [facility]   CAROTID ENDARTERECTOMY     CORONARY ARTERY BYPASS GRAFT  10/10/2004   x5   Lt VATS,Lt thoacotomy,Lt lower lobectomy with  mediastinal node  dissection  06/11/11   [facility]   Patient Active Problem List   Diagnosis Date Noted   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   Malignant neoplasm of lower lobe, bronchus, or lung 03/27/2012   History of radiation therapy    Skin cancer    BPH (benign prostatic hyperplasia)    Squamous cell carcinoma lung, Left lower lobe    CAD, NATIVE VESSEL 03/22/2010   HYPERLIPIDEMIA-MIXED 03/17/2009   HYPERTENSION, UNSPECIFIED 03/17/2009   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   CAROTID ENDARTERECTOMY, LEFT, HX OF 03/17/2009    ONSET DATE: 3 years ago noticing memory/language changes, referral 03/18/2022    REFERRING DIAG: G30.9,F01.50,F02.80  (ICD-10-CM) - Mixed Alzheimer's and vascular dementia ([place])   THERAPY DIAG:  Cognitive communication deficit  SUBJECTIVE: "fantastic"  PAIN:  Are you having pain? No  OBJECTIVE:   TODAY'S TREATMENT:  04-30-22: Pt presents with emotionality surrounding memory loss. Endorses having a fear of reconnecting with family and friends who he hasn't seen. Education on mental health components of dementia and recommendation made to seek mental health services if feeling overwhelmed by fear, anxiety, or depression. ST encourages pt to discuss memories with [name] and use her as a Interior and spatial designer."  Reinforces positive emotions pt experienced following visit with sister and brother in law this past weekend.   04-18-22: Target using conversational exchange to supplement memory book. Target recall of work peers. Able to generate x4 facts about 3 people. Discussed ways to connect with people despite memory impairment. Generated ideas of talking about current family events, move to [place], vacations taken. Able to relay story about work with many details, only min-A from [place] regarding accuracy of some (e.g. people's names). Education and modeling on cueing strategies and ? To ask to facilitate additions to pt's memory book.   04-16-22: [name] and [place] bring pictures of family to share with [place] provides usual modeling of using pictures to talk about what is pictured, the people in the pictures, other connected memories. Education  about using photos as a starting point to orient patient and lead through discussions. Education about constructing a book to document relevant, important memories that Ivie can refer back to. Recommended layout includes simple design with one picture, few key words or phrases. Recommend including orientation information typically forgotten that [place] would like to recall.   04-09-2022: Target recall of important memorable event to [place], daughter's wedding. Usual doubt regarding accuracy  of memories, [name] able to verify and encourage [place] through task. Pt endorses appreciation for task, tells ST he'd like to move forward with creating memory book of other memories. Provide education on use of pictures and videos to support recall and construction of memory book. [name] tells ST she will look for appropriate photos. [place] to keep track of stories he enjoys telling daughter next time they have lengthy discussion.     PATIENT EDUCATION: Education details: see above Person educated: Patient and Spouse Education method: Explanation, Demonstration, and Handouts Education comprehension: verbalized understanding, returned demonstration, and needs further education     GOALS: Goals reviewed with patient? Yes   SHORT TERM GOALS: Target date: 04/24/2022   Pt and care-partner will implement x2 external aids to facilitate orientation to time over 2 sessions.  Baseline: 04-16-22, 04-23-22 Goal status: met   2.  Pt and care-partner will initiate generation of memory book to assist pt in recalling important people and events with mod-I  Baseline: 04-16-22 Goal status: met   3.  Pt will use memory aid to accurately answer personally relevant questions with 80% accuracy over 2 visits.  Baseline: 04-16-22, 04-23-22 Goal status: partially met     LONG TERM GOALS: Target date: 05/22/2022   Pt will report subjective increase in confidence in ability to discern accuracy of recalled events with aid of memory book, with occasional mod-A.  Baseline:  Goal status: ongoing   2.  Pt and care partners will use external memory aids or memory book to recall personally relevant places, names, trips, events etc over 4 sessions Baseline:  Goal status: ongoing   ASSESSMENT:   CLINICAL IMPRESSION: Patient is a 81 y.o. M who was seen today for cognitive communication deficits 2/2 dx of dementia. Pt and spouse primarily concerned regarding pt's declining memory. Pt tells ST he has no "time  reference" and thus is unsure of when deficits began. Wife reports ongoing since 2017, with worsening memory during pandemic when they were "stuck at home." Pt interacts appropriately in conversations but is very unsure of himself, tells ST "I have this memory but I don't know if I'm making it up." Pt with significant visual impairment, is legally blind. Currently using magnifying glass with little success. D/t significant cognitive impairment, and dementia dx, standardized assessment not indicated at this time. During assessment, unable to generate year. Unable to tell month despite wife reporting daily orientation practice using home calendar. Pt endorses frustration stemming from memory impairment. I recommend skilled ST to maximize cognition, train caregivers in compensatory strategies to support memory in daily activities and use of external memory aids for safety and to reduce caregiver burden.    OBJECTIVE IMPAIRMENTS include attention, memory, awareness, and executive functioning. These impairments are limiting patient from ADLs/IADLs and effectively communicating at home and in community. Factors affecting potential to achieve goals and functional outcome are medical prognosis and previous level of function. Patient will benefit from skilled SLP services to address above impairments and improve overall function.   REHAB POTENTIAL: Fair d/t prognosis   PLAN: SLP  FREQUENCY: 1-2x/week - will initiate at 1x per week, will adjust to 2x per week if indicated based on carryover of implemented strategies    SLP DURATION: 8 weeks   PLANNED INTERVENTIONS: Language facilitation, Cueing hierachy, Cognitive reorganization, Internal/external aids, Functional tasks, Multimodal communication approach, SLP instruction and feedback, Compensatory strategies, and Patient/family education  Su Monks, CCC-SLP 04/30/2022, 3:35 PM

## 2022-05-07 ENCOUNTER — Ambulatory Visit: Payer: Medicare Other | Attending: Physician Assistant | Admitting: Speech Pathology

## 2022-05-07 DIAGNOSIS — R41841 Cognitive communication deficit: Secondary | ICD-10-CM | POA: Insufficient documentation

## 2022-05-07 NOTE — Therapy (Signed)
OUTPATIENT SPEECH LANGUAGE PATHOLOGY TREATMENT NOTE   Patient Name: Rodney Christensen MRN: [id number] DOB:Apr 07, 1941, 81 y.o., male Today's Date: 05/07/2022  PCP: Rodney Anchors, MD REFERRING PROVIDER: Rondel Jumbo, PA-C  END OF SESSION:   End of Session - 05/07/22 1437     Visit Number 7    Number of Visits 17    Date for SLP Re-Evaluation 05/21/22    Authorization Type [employer] Medicare    Progress Note Due on Visit 10    SLP Start Time 1437    SLP Stop Time  1521    SLP Time Calculation (min) 44 min    Activity Tolerance Patient tolerated treatment well               Past Medical History:  Diagnosis Date   BPH (benign prostatic hyperplasia)    CAD, NATIVE VESSEL 03/22/2010   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   Diverticulosis    History of radiation therapy 03/18/11 to 04/19/11   lower left lung   HYPERLIPIDEMIA-MIXED 03/17/2009   HYPERTENSION, UNSPECIFIED 03/17/2009   Macular degeneration    Skin cancer    melanoma-nose   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    Past Surgical History:  Procedure Laterality Date   BRONCHOSCOPY  06/11/11   [facility]   CAROTID ENDARTERECTOMY     CORONARY ARTERY BYPASS GRAFT  10/10/2004   x5   Lt VATS,Lt thoacotomy,Lt lower lobectomy with  mediastinal node  dissection  06/11/11   [facility]   Patient Active Problem List   Diagnosis Date Noted   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   Malignant neoplasm of lower lobe, bronchus, or lung 03/27/2012   History of radiation therapy    Skin cancer    BPH (benign prostatic hyperplasia)    Squamous cell carcinoma lung, Left lower lobe    CAD, NATIVE VESSEL 03/22/2010   HYPERLIPIDEMIA-MIXED 03/17/2009   HYPERTENSION, UNSPECIFIED 03/17/2009   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 No  OBJECTIVE:   TODAY'S TREATMENT:  05-07-22: Pt continues to endorse avoidance of communication opportunities d/t anxiety related to memory loss. SLP provides encouragement for engaging in these opportunities reminding pt that loved ones will cherish the chance to connect. [name] endorses this and tells [name] that they both get "enamored" when talking to him. Encourage [name] to facilitate opportunities as able. Let pt through conversation, targeting verbal expression of memories. Requires max-A to initiate, initially reluctant. SLP demonstrates extended wait times, thoughtful corrections, and encouragement to allow pt to tell self-selected story. Discussion on decided which details to correct vs which errors are ok to let go. Wife endorses understanding.   04-30-22: Pt presents with emotionality surrounding memory loss. Endorses having a fear of reconnecting with family and friends who he hasn't seen. Education on mental health components of dementia and recommendation made to seek mental health services if feeling overwhelmed by fear, anxiety, or depression. ST encourages pt to discuss memories with [name] and use her as a Interior and spatial designer."  Reinforces positive emotions pt experienced following visit with sister and brother in law this past weekend.    PATIENT EDUCATION:  Education details: see above Person educated: Patient and Spouse Education method: Explanation, Demonstration, and Handouts Education comprehension: verbalized understanding, returned demonstration, and needs further education     GOALS: Goals reviewed with patient? Yes   SHORT TERM GOALS: Target date: 04/24/2022   Pt and care-partner will implement x2 external aids to facilitate orientation to time over 2 sessions.  Baseline:  04-16-22, 04-23-22 Goal status: met   2.  Pt and care-partner will initiate generation of memory book to assist pt in recalling important people and events with mod-I  Baseline: 04-16-22 Goal status: met   3.  Pt will use memory aid to accurately answer personally relevant questions with 80% accuracy over 2 visits.  Baseline: 04-16-22, 04-23-22 Goal status: partially met     LONG TERM GOALS: Target date: 05/22/2022   Pt will report subjective increase in confidence in ability to discern accuracy of recalled events with aid of memory book, with occasional mod-A.  Baseline:  Goal status: ongoing   2.  Pt and care partners will use external memory aids or memory book to recall personally relevant places, names, trips, events etc over 4 sessions Baseline:  Goal status: ongoing   ASSESSMENT:   CLINICAL IMPRESSION: Patient is a 81 y.o. M who was seen today for cognitive communication deficits 2/2 dx of dementia. Pt and spouse primarily concerned regarding pt's declining memory. Pt tells ST he has no "time reference" and thus is unsure of when deficits began. Wife reports ongoing since 2017, with worsening memory during pandemic when they were "stuck at home." Pt interacts appropriately in conversations but is very unsure of himself, tells ST "I have this memory but I don't know if I'm making it up." Pt with significant visual impairment, is legally blind. Currently using magnifying glass with little success. D/t significant cognitive impairment, and dementia dx, standardized assessment not indicated at this time. During assessment, unable to generate year. Unable to tell month despite wife reporting daily orientation practice using home calendar. Pt endorses frustration stemming from memory impairment. I recommend skilled ST to maximize cognition, train caregivers in compensatory strategies to support memory in daily activities and use of external memory aids for safety and to reduce caregiver burden.     OBJECTIVE IMPAIRMENTS include attention, memory, awareness, and executive functioning. These impairments are limiting patient from ADLs/IADLs and effectively communicating at home and in community. Factors affecting potential to achieve goals and functional outcome are medical prognosis and previous level of function. Patient will benefit from skilled SLP services to address above impairments and improve overall function.   REHAB POTENTIAL: Fair d/t prognosis   PLAN: SLP FREQUENCY: 1-2x/week - will initiate at 1x per week, will adjust to 2x per week if indicated based on carryover of implemented strategies    SLP DURATION: 8 weeks   PLANNED INTERVENTIONS: Language facilitation, Cueing hierachy, Cognitive reorganization, Internal/external aids, Functional tasks, Multimodal communication approach, SLP instruction and feedback, Compensatory strategies, and Patient/family education  Su Monks, CCC-SLP 05/07/2022, 3:21 PM

## 2022-05-14 ENCOUNTER — Ambulatory Visit: Payer: Medicare Other | Admitting: Speech Pathology

## 2022-05-14 DIAGNOSIS — R41841 Cognitive communication deficit: Secondary | ICD-10-CM

## 2022-05-14 NOTE — Therapy (Signed)
OUTPATIENT SPEECH LANGUAGE PATHOLOGY TREATMENT NOTE   Patient Name: Rodney Christensen MRN: [id number] DOB:17-Jun-1941, 81 y.o., male Today's Date: 05/14/2022  PCP: Rodney Anchors, MD REFERRING PROVIDER: Rondel Jumbo, PA-C  END OF SESSION:   End of Session - 05/14/22 1446     Visit Number 8    Number of Visits 17    Date for SLP Re-Evaluation 05/21/22    Authorization Type [employer] Medicare    Progress Note Due on Visit 10    SLP Start Time 1446    SLP Stop Time  1529    SLP Time Calculation (min) 43 min    Activity Tolerance Patient tolerated treatment well               Past Medical History:  Diagnosis Date   BPH (benign prostatic hyperplasia)    CAD, NATIVE VESSEL 03/22/2010   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   Diverticulosis    History of radiation therapy 03/18/11 to 04/19/11   lower left lung   HYPERLIPIDEMIA-MIXED 03/17/2009   HYPERTENSION, UNSPECIFIED 03/17/2009   Macular degeneration    Skin cancer    melanoma-nose   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    Past Surgical History:  Procedure Laterality Date   BRONCHOSCOPY  06/11/11   [facility]   CAROTID ENDARTERECTOMY     CORONARY ARTERY BYPASS GRAFT  10/10/2004   x5   Lt VATS,Lt thoacotomy,Lt lower lobectomy with  mediastinal node  dissection  06/11/11   [facility]   Patient Active Problem List   Diagnosis Date Noted   Mixed Alzheimer's and vascular dementia (Rodney Christensen) 09/17/2021   Malignant neoplasm of lower lobe, bronchus, or lung 03/27/2012   History of radiation therapy    Skin cancer    BPH (benign prostatic hyperplasia)    Squamous cell carcinoma lung, Left lower lobe    CAD, NATIVE VESSEL 03/22/2010   HYPERLIPIDEMIA-MIXED 03/17/2009   HYPERTENSION, UNSPECIFIED 03/17/2009   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 No  OBJECTIVE:   TODAY'S TREATMENT:  05-14-22: Pt reports continued frustration regarding memory loss. Re-education provided on letting PCP know about emotional changes or struggles re: dementia. Pt reports getting "glimpse" of memories and tries to hold on to them in his mind, but then they "fade." ST provides recommendation of verbalizing these memories when they occur and talking them out with [facility]. Pt requires max encouragement to agree to try this technique to encourage increased conversation and fodder for memory book. Wife reports using pictures and conversational topics to support recall of personally relevant events.   05-07-22: Pt continues to endorse avoidance of communication opportunities d/t anxiety related to memory loss. SLP provides encouragement for engaging in these opportunities reminding pt that loved ones will cherish the chance to connect. Rodney Christensen endorses this and tells Rodney Christensen that they both get "enamored" when talking to him. Encourage Rodney Christensen to facilitate opportunities as able. Let pt through conversation, targeting verbal expression of memories. Requires max-A to initiate, initially reluctant. SLP demonstrates extended wait times, thoughtful corrections, and encouragement to allow pt to tell self-selected story.  Discussion on decided which details to correct vs which errors are ok to let go. Wife endorses understanding.   04-30-22: Pt presents with emotionality surrounding memory loss. Endorses having a fear of reconnecting with family and friends who he hasn't seen. Education on mental health components of dementia and recommendation made to seek mental health services if feeling overwhelmed by fear, anxiety, or depression. ST encourages pt  to discuss memories with Rodney Christensen and use her as a Interior and spatial designer."  Reinforces positive emotions pt experienced following visit with sister and brother in law this past weekend.    PATIENT EDUCATION: Education details: see above Person educated: Patient and Spouse Education method: Explanation, Demonstration, and Handouts Education comprehension: verbalized understanding, returned demonstration, and needs further education     GOALS: Goals reviewed with patient? Yes   SHORT TERM GOALS: Target date: 04/24/2022   Pt and care-partner will implement x2 external aids to facilitate orientation to time over 2 sessions.  Baseline: 04-16-22, 04-23-22 Goal status: met   2.  Pt and care-partner will initiate generation of memory book to assist pt in recalling important people and events with mod-I  Baseline: 04-16-22 Goal status: met   3.  Pt will use memory aid to accurately answer personally relevant questions with 80% accuracy over 2 visits.  Baseline: 04-16-22, 04-23-22 Goal status: partially met     LONG TERM GOALS: Target date: 05/22/2022   Pt will report subjective increase in confidence in ability to discern accuracy of recalled events with aid of memory book, with occasional mod-A.  Baseline: 05/14/22 Goal status: ongoing   2.  Pt and care partners will use external memory aids or memory book to recall personally relevant places, names, trips, events etc over 4 sessions Baseline: 05/14/22 Goal status: ongoing   ASSESSMENT:   CLINICAL IMPRESSION: Patient is a 81 y.o. M who was seen today for cognitive communication deficits 2/2 dx of dementia. Pt and spouse primarily concerned regarding pt's declining memory. Pt tells ST he has no "time reference" and thus is unsure of when deficits began. Wife reports ongoing since 2017, with worsening memory during pandemic when they were "stuck at home." Pt interacts appropriately in conversations but is very unsure of himself, tells ST "I have this  memory but I don't know if I'm making it up." Pt with significant visual impairment, is legally blind. Currently using magnifying glass with little success. D/t significant cognitive impairment, and dementia dx, standardized assessment not indicated at this time. During assessment, unable to generate year. Unable to tell month despite wife reporting daily orientation practice using home calendar. Pt endorses frustration stemming from memory impairment. I recommend skilled ST to maximize cognition, train caregivers in compensatory strategies to support memory in daily activities and use of external memory aids for safety and to reduce caregiver burden.    OBJECTIVE IMPAIRMENTS include attention, memory, awareness, and executive functioning. These impairments are limiting patient from ADLs/IADLs and effectively communicating at home and in community. Factors affecting potential to achieve goals and functional outcome are medical prognosis and previous level of function. Patient will benefit from skilled SLP services to address above impairments and improve overall function.   REHAB POTENTIAL: Fair d/t prognosis   PLAN: SLP FREQUENCY: 1-2x/week - will initiate at 1x per week, will adjust to 2x per week if indicated based on carryover of implemented strategies    SLP DURATION: 8 weeks   PLANNED INTERVENTIONS: Language facilitation, Cueing hierachy, Cognitive reorganization, Internal/external aids, Functional tasks, Multimodal communication approach,  SLP instruction and feedback, Compensatory strategies, and Patient/family education  Su Monks, [place] 05/14/2022, 2:47 PM

## 2022-05-21 ENCOUNTER — Ambulatory Visit: Payer: Medicare Other | Admitting: Speech Pathology

## 2022-05-21 NOTE — Therapy (Deleted)
OUTPATIENT SPEECH LANGUAGE PATHOLOGY TREATMENT NOTE + RECERTIFICATION   Patient Name: Rodney Christensen MRN: [id number] DOB:30-Sep-1941, 81 y.o., male Today's Date: 05/21/2022  PCP: [name], MD REFERRING PROVIDER: Rondel Jumbo, PA-C  END OF SESSION:       Past Medical History:  Diagnosis Date   BPH (benign prostatic hyperplasia)    CAD, NATIVE VESSEL 03/22/2010   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   Diverticulosis    History of radiation therapy 03/18/11 to 04/19/11   lower left lung   HYPERLIPIDEMIA-MIXED 03/17/2009   HYPERTENSION, UNSPECIFIED 03/17/2009   Macular degeneration    Skin cancer    melanoma-nose   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    Past Surgical History:  Procedure Laterality Date   BRONCHOSCOPY  06/11/11   [facility]   CAROTID ENDARTERECTOMY     CORONARY ARTERY BYPASS GRAFT  10/10/2004   x5   Lt VATS,Lt thoacotomy,Lt lower lobectomy with  mediastinal node  dissection  06/11/11   [facility]   Patient Active Problem List   Diagnosis Date Noted   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   Malignant neoplasm of lower lobe, bronchus, or lung 03/27/2012   History of radiation therapy    Skin cancer    BPH (benign prostatic hyperplasia)    Squamous cell carcinoma lung, Left lower lobe    CAD, NATIVE VESSEL 03/22/2010   HYPERLIPIDEMIA-MIXED 03/17/2009   HYPERTENSION, UNSPECIFIED 03/17/2009   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 No  OBJECTIVE:   TODAY'S TREATMENT:  05-21-22: ***  05-14-22: Pt reports continued  frustration regarding memory loss. Re-education provided on letting PCP know about emotional changes or struggles re: dementia. Pt reports getting "glimpse" of memories and tries to hold on to them in his mind, but then they "fade." ST provides recommendation of verbalizing these memories when they occur and talking them out with [facility]. Pt requires max encouragement to agree to try this technique to encourage increased conversation and fodder for memory book. Wife reports using pictures and conversational topics to support recall of personally relevant events.   05-07-22: Pt continues to endorse avoidance of communication opportunities d/t anxiety related to memory loss. SLP provides encouragement for engaging in these opportunities reminding pt that loved ones will cherish the chance to connect. [name] endorses this and tells [place] that they both get "enamored" when talking to him. Encourage [name] to facilitate opportunities as able. Let pt through conversation, targeting verbal expression of memories. Requires max-A to initiate, initially reluctant. SLP demonstrates extended wait times, thoughtful corrections, and encouragement to allow pt to tell self-selected story. Discussion on decided which details to correct vs which errors are ok to let go. Wife endorses understanding.   04-30-22: Pt presents with emotionality surrounding memory loss. Endorses having a fear of reconnecting with family and friends who he hasn't seen. Education on mental health components of dementia and recommendation made to seek mental health services if feeling overwhelmed by fear, anxiety, or depression. ST encourages pt to discuss memories with [name] and use her as a Interior and spatial designer."  Reinforces  positive emotions pt experienced following visit with sister and brother in law this past weekend.    PATIENT EDUCATION: Education details: see above Person educated: Patient and Spouse Education method: Explanation, Demonstration,  and Handouts Education comprehension: verbalized understanding, returned demonstration, and needs further education     GOALS: Goals reviewed with patient? Yes   SHORT TERM GOALS: Target date: 04/24/2022   Pt and care-partner will implement x2 external aids to facilitate orientation to time over 2 sessions.  Baseline: 04-16-22, 04-23-22 Goal status: met   2.  Pt and care-partner will initiate generation of memory book to assist pt in recalling important people and events with mod-I  Baseline: 04-16-22 Goal status: met   3.  Pt will use memory aid to accurately answer personally relevant questions with 80% accuracy over 2 visits.  Baseline: 04-16-22, 04-23-22 Goal status: partially met     LONG TERM GOALS: Target date: 06/18/2022 (+4 weeks for recertification)   Pt will report subjective increase in confidence in ability to discern accuracy of recalled events with aid of memory book, with occasional mod-A.  Baseline: 05/14/22, *** Goal status: ***   2.  Pt and care partners will use external memory aids or memory book to recall personally relevant places, names, trips, events etc over 4 sessions Baseline: 05/14/22, *** Goal status: ***  3.  *** Baseline:  Goal status: NEW GOAL  4.  ***  Baseline:  Goal status: NEW GOAL ASSESSMENT:   CLINICAL IMPRESSION: Patient is a 81 y.o. M who was seen today for cognitive communication deficits 2/2 dx of dementia. Pt and spouse primarily concerned regarding pt's declining memory. Pt tells ST he has no "time reference" and thus is unsure of when deficits began. Wife reports ongoing since 2017, with worsening memory during pandemic when they were "stuck at home." Pt interacts appropriately in conversations but is very unsure of himself, tells ST "I have this memory but I don't know if I'm making it up." Pt with significant visual impairment, is legally blind. Currently using magnifying glass with little success. D/t significant cognitive impairment,  and dementia dx, standardized assessment not indicated at this time. During assessment, unable to generate year. Unable to tell month despite wife reporting daily orientation practice using home calendar. Pt endorses frustration stemming from memory impairment. I recommend skilled ST to maximize cognition, train caregivers in compensatory strategies to support memory in daily activities and use of external memory aids for safety and to reduce caregiver burden. ***   OBJECTIVE IMPAIRMENTS include attention, memory, awareness, and executive functioning. These impairments are limiting patient from ADLs/IADLs and effectively communicating at home and in community. Factors affecting potential to achieve goals and functional outcome are medical prognosis and previous level of function. Patient will benefit from skilled SLP services to address above impairments and improve overall function.   REHAB POTENTIAL: Fair d/t prognosis   PLAN: SLP FREQUENCY: 1-2x/week - will initiate at 1x per week, will adjust to 2x per week if indicated based on carryover of implemented strategies    SLP DURATION: 8 weeks   PLANNED INTERVENTIONS: Language facilitation, Cueing hierachy, Cognitive reorganization, Internal/external aids, Functional tasks, Multimodal communication approach, SLP instruction and feedback, Compensatory strategies, and Patient/family education  Su Monks, CCC-SLP 05/21/2022, 8:18 AM

## 2022-06-21 ENCOUNTER — Ambulatory Visit: Payer: Medicare Other | Attending: Physician Assistant | Admitting: Speech Pathology

## 2022-06-21 DIAGNOSIS — R41841 Cognitive communication deficit: Secondary | ICD-10-CM | POA: Insufficient documentation
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
## 2022-09-17 ENCOUNTER — Ambulatory Visit: Payer: Medicare Other | Admitting: Physician Assistant

## 2022-09-17 ENCOUNTER — Encounter: Payer: Self-pay | Admitting: Physician Assistant

## 2022-09-17 VITALS — BP 129/58 | HR 61 | Ht 67.0 in | Wt 178.0 lb

## 2022-09-17 DIAGNOSIS — G309 Alzheimer's disease, unspecified: Secondary | ICD-10-CM | POA: Diagnosis not present

## 2022-09-17 DIAGNOSIS — F0150 Vascular dementia without behavioral disturbance: Secondary | ICD-10-CM | POA: Diagnosis not present

## 2022-09-17 DIAGNOSIS — F0280 Dementia in other diseases classified elsewhere without behavioral disturbance: Secondary | ICD-10-CM

## 2022-09-17 MED ORDER — MEMANTINE HCL 10 MG PO TABS: ORAL_TABLET | ORAL | 4 refills | Status: DC

## 2022-09-17 MED ORDER — DONEPEZIL HCL 5 MG PO TABS: 5.0000 mg | ORAL_TABLET | Freq: Every day | ORAL | 11 refills | Status: DC

## 2022-09-17 NOTE — Progress Notes (Signed)
Assessment/Plan:   Dementia likely due to  Rodney Christensen is a very pleasant 81 y.o. RH male seen today in follow up for memory loss. Patient is currently on . 09/01/21 MRI brain personally reviewed was remarkable for  moderate cerebral atrophy, small chronic left parietal infarct, without any acute intracranial abnormalities. He is on memantine 10 mg bid, tolerating well.  Today's MoCA blind is 9/22, indicative of mild worsening of his memory.      Follow up in 6 months. Continue Memantine 10 mg twice daily. Side effects were discussed  Start donepezil 5 mg daily, side effects discussed.  This dose may need to be increased to 10 mg daily if no significant decrease in pulse rate is noted Recommend good control of cardiovascular risk factors.    Continue to control mood as per PCP     Subjective:    This patient is accompanied in the office by his wife who supplements the history.  Previous records as well as any outside records available were reviewed prior to todays visit. Last seen on 03/18/22   Any changes in memory since last visit? Continues to do speech therapy, but the wife did not notice any changes, or improvement in his speech, for which they are considering no extension of this therapy after completion.  His wife reports no significant changes in his memory.  He reports "memory is about bits and pieces all over the place ".  His long-term memory is normal.  He likes to watch TV and movies and sports.  He is not very active repeats oneself?  Endorsed Disoriented when walking into a room?  Patient denies   Leaving objects in unusual places?  Patient denies   Ambulates  with difficulty?   Patient denies   Recent falls?  Patient denies   Any head injuries?  Patient denies   History of seizures?   Patient denies   Wandering behavior?  Patient denies .  "He doesn't's walk alone because of his sight " Patient drives?   Patient no longer drives Any mood changes since last visit?   Patient denies   Any worsening depression?:  Patient denies   Hallucinations?  Patient denies   Paranoia?  Patient denies   Patient reports that sleeps well without vivid dreams, REM behavior or sleepwalking   History of sleep apnea?  Patient denies   Any hygiene concerns?  Patient denies   Independent of bathing and dressing?  He needs some assistance in view of poor vision. Does the patient needs help with medications?  Wife is in charge.  He has discontinued all of the extra supplements of vitamins Who is in charge of the finances? Wife  is in charge    Any changes in appetite?  Patient denies   Patient have trouble swallowing? Patient denies   Does the patient cook?  Patient denies   Any kitchen accidents such as leaving the stove on? Patient denies   Any headaches?  Patient denies   Double vision? poor vision, no longer using the computer.  Any focal numbness or tingling?  Patient denies   Chronic back pain Patient denies   Unilateral weakness?  Patient denies   Any tremors?  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  Patient denies   Any bowel dysfunction?   Patient denies      Patient lives with: Wife  Initial visit 09/26/21 The patient is seen  in neurologic consultation at the request of Rodney Anchors, MD for the evaluation of memory.  The patient is accompanied by his wife who supplements the history. This is a 81 y.o. year old male who has had memory issues for about 3-1/2 years, worse over the last 2 years, especially during the [place] pandemic.  He repeats the same questions, sometimes he "does not know where he is ".  He also repeats the same sentences.  He lives with his wife, who noticed these changes as well.  His mood is good, denies depression or irritability.  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  He "reads very little "because he is eyesight is very poor.  He  has issues with insomnia, sleeping 1 hour at the time, "gets up and goes to the bathroom, and a good night he will wake up once ".  He sleeps about 6 hours.  He denies any vivid dreams, or sleepwalking.  He denies any hallucinations or paranoia.  He denies leaving objects in unusual places.  He is independent of bathing and dressing.  Wife administers the medication and manages the finances.  His appetite is "okay", eating 3 meals a day, denies trouble swallowing.  He does not cook, but can make a sandwich. Ambulates  without a walker, unless needed, denies any falls or head injuries.  He stopped driving in the [place] due to eyesight difficulties.  He denies any headaches, dizziness, focal numbness or tingling, unilateral weakness or tremors.  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  No history of seizures.  Denies constipation or diarrhea.  He has a history of urine incontinence.  He denies a history of sleep apnea, alcohol or tobacco.  Family history remarkable for aunt with dementia of unknown type.     MRI brain wo contrast 09/01/21  1. No acute intracranial abnormality. 2. Progressive, moderate cerebral atrophy. 3. Small chronic left parietal infarct.   B12 and TSH at the PCPs office were normal   PREVIOUS MEDICATIONS:   CURRENT MEDICATIONS:  Outpatient Encounter Medications as of 09/17/2022  Medication Sig   bimatoprost (LUMIGAN) 0.03 % ophthalmic drops Place 1 drop into both eyes at bedtime.     donepezil (ARICEPT) 5 MG tablet Take 1 tablet (5 mg total) by mouth at bedtime.   dorzolamide (TRUSOPT) 2 % ophthalmic solution Place 1 drop into both eyes 2 (two) times daily as needed.   doxazosin (CARDURA) 8 MG tablet Take 8 mg by mouth at bedtime.     lisinopril (PRINIVIL,ZESTRIL) 40 MG tablet Take 40 mg by mouth 2 (two) times daily.    Melatonin 1 MG CAPS Take by mouth at bedtime.   metoprolol (LOPRESSOR) 50 MG tablet Take 50 mg by mouth 2 (two) times daily.     potassium  chloride SA (K-DUR,KLOR-CON) 20 MEQ tablet    simvastatin (ZOCOR) 20 MG tablet Take 20 mg by mouth every evening.   torsemide (DEMADEX) 20 MG tablet 20 mg daily.    [DISCONTINUED] memantine (NAMENDA) 10 MG tablet Take 1 tablet (10 mg) twice a day   amLODipine (NORVASC) 10 MG tablet Take 10 mg by mouth daily. (Patient not taking: Reported on 09/17/2022)   Ascorbic Acid (VITAMIN C) 500 MG tablet Take 500 mg by mouth daily.  (Patient not taking: Reported on 09/17/2021)   B Complex Vitamins (B COMPLEX 100 PO) Take by mouth daily.   (Patient not taking: Reported on  09/17/2021)   beta carotene 25000 UNIT capsule Take 25,000 Units by mouth daily.   (Patient not taking: Reported on 09/17/2021)   Bilberry 500 MG CAPS Take 500 mg by mouth daily.  (Patient not taking: Reported on 09/17/2021)   Calcium-Magnesium-Zinc (CAL-MAG-ZINC PO) Take by mouth as directed.   (Patient not taking: Reported on 09/17/2022)   Chelated Zinc 50 MG TABS Take by mouth daily.   (Patient not taking: Reported on 09/17/2021)   Cinnamon 500 MG capsule Take 1,000 mg by mouth daily.  (Patient not taking: Reported on 09/17/2021)   Coenzyme Q10 (CO Q 10) 100 MG CAPS Take by mouth daily.   (Patient not taking: Reported on 09/17/2021)   dorzolamide-timolol (COSOPT) 22.3-6.8 MG/ML ophthalmic solution Place 1 drop into the left eye at bedtime. (Patient not taking: Reported on 09/17/2022)   fish oil-omega-3 fatty acids 1000 MG capsule Take 2 g by mouth 3 (three) times daily.   (Patient not taking: Reported on 12/04/7251)   folic acid (FOLVITE) 1 MG tablet Take 1 mg by mouth daily.   (Patient not taking: Reported on 66/44/0347)   Garlic 4259 MG CAPS Take by mouth daily. (Patient not taking: Reported on 09/17/2022)   Ginkgo Biloba 40 MG TABS Take by mouth daily.   (Patient not taking: Reported on 09/17/2021)   LUTEIN PO Take by mouth 2 (two) times daily.   (Patient not taking: Reported on 09/17/2022)   memantine (NAMENDA) 10 MG tablet Take 1  tablet (10 mg) twice a day   Multiple Vitamin (MULTIVITAMIN) tablet Take 1 tablet by mouth daily.   (Patient not taking: Reported on 09/17/2021)   No facility-administered encounter medications on file as of 09/17/2022.        No data to display            09/17/2022   11:00 AM 09/17/2021   10:00 AM  Montreal Cognitive Assessment   Attention: Read list of digits (0/2) 2 2  Attention: Read list of letters (0/1) 1 1  Attention: Serial 7 subtraction starting at 100 (0/3) 3 3  Language: Repeat phrase (0/2) 1 1  Language : Fluency (0/1) 0 1  Abstraction (0/2) 1 2  Delayed Recall (0/5) 0 0  Orientation (0/6) 1 2    Objective:     PHYSICAL EXAMINATION:    VITALS:   Vitals:   09/17/22 0928  BP: (!) 129/58  Pulse: 61  SpO2: 96%  Weight: 178 lb (80.7 kg)  Height: 5\' 7"  (1.702 m)    GEN:  The patient appears stated age and is in NAD. HEENT:  Normocephalic, atraumatic.   Neurological examination:  General: NAD, well-groomed, appears stated age. Orientation: The patient is alert. Oriented to person, but the place is [facility], unable to tell the date.  Cranial nerves: There is good facial symmetry.The speech is fluent and clear. No aphasia or dysarthria. Fund of knowledge is reduced recent and remote memory are impaired. Attention and concentration are reduced.  Able to name objects and repeat phrases.  Hearing is intact to conversational tone.    Sensation: Sensation is intact to light touch throughout.  Delayed recall 0/5 Motor: Strength is at least antigravity x4. Tremors: none  DTR's 2/4 in UE/LE     Movement examination: Tone: There is normal tone in the UE/LE Abnormal movements:  no tremor.  No myoclonus.  No asterixis.   Coordination:  There is no decremation with RAM's. Normal finger to nose  Gait and Station: The patient  has no difficulty arising out of a deep-seated chair without the use of the hands. The patient's stride length is good.  Gait is  cautious and narrow.    Thank you for allowing [place] the opportunity to participate in the care of this nice patient. Please do not hesitate to contact us for any questions or concerns.   Total time spent on today's visit was 22 minutes dedicated to this patient today, preparing to see patient, examining the patient, ordering tests and/or medications and counseling the patient, documenting clinical information in the EHR or other health record, independently interpreting results and communicating results to the patient/family, discussing treatment and goals, answering patient's questions and coordinating care.  Cc:  Rodney Anchors, MD  Sharene Butters 09/17/2022 11:57 AM

## 2022-09-17 NOTE — Patient Instructions (Addendum)
It was a pleasure to see you today at our office.   Recommendations:  Continue Memantine 10mg  tablets 1 tablet twice daily.  Take 5 mg nightly and monitor the pulse no lower than 50  Follow up in 6 months   RECOMMENDATIONS FOR ALL PATIENTS WITH MEMORY PROBLEMS: 1. Continue to exercise (Recommend 30 minutes of walking everyday, or 3 hours every week) 2. Increase social interactions - continue going to [place] and enjoy social gatherings with friends and family 3. Eat healthy, avoid fried foods and eat more fruits and vegetables 4. Maintain adequate blood pressure, blood sugar, and blood cholesterol level. Reducing the risk of stroke and cardiovascular disease also helps promoting better memory. 5. Avoid stressful situations. Live a simple life and avoid aggravations. Organize your time and prepare for the next day in anticipation. 6. Sleep well, avoid any interruptions of sleep and avoid any distractions in the bedroom that may interfere with adequate sleep quality 7. Avoid sugar, avoid sweets as there is a strong link between excessive sugar intake, diabetes, and cognitive impairment We discussed the Mediterranean diet, which has been shown to help patients reduce the risk of progressive memory disorders and reduces cardiovascular risk. This includes eating fish, eat fruits and green leafy vegetables, nuts like almonds and hazelnuts, walnuts, and also use olive oil. Avoid fast foods and fried foods as much as possible. Avoid sweets and sugar as sugar use has been linked to worsening of memory function.  There is always a concern of gradual progression of memory problems. If this is the case, then we may need to adjust level of care according to patient needs. Support, both to the patient and caregiver, should then be put into place.    FALL PRECAUTIONS: Be cautious when walking. Scan the area for obstacles that may increase the risk of trips and falls. When getting up in the mornings, sit up at  the edge of the bed for a few minutes before getting out of bed. Consider elevating the bed at the head end to avoid drop of blood pressure when getting up. Walk always in a well-lit room (use night lights in the walls). Avoid area rugs or power cords from appliances in the middle of the walkways. Use a walker or a cane if necessary and consider physical therapy for balance exercise. Get your eyesight checked regularly.     HOME SAFETY: Consider the safety of the kitchen when operating appliances like stoves, microwave oven, and blender. Consider having supervision and share cooking responsibilities until no longer able to participate in those. Accidents with firearms and other hazards in the house should be identified and addressed as well.   ABILITY TO BE LEFT ALONE: If patient is unable to contact 911 operator, consider using LifeLine, or when the need is there, arrange for someone to stay with patients. Smoking is a fire hazard, consider supervision or cessation. Risk of wandering should be assessed by caregiver and if detected at any point, supervision and safe proof recommendations should be instituted.  MEDICATION SUPERVISION: Inability to self-administer medication needs to be constantly addressed. Implement a mechanism to ensure safe administration of the medications.       Mediterranean Diet A Mediterranean diet refers to food and lifestyle choices that are based on the traditions of countries located on the [employer]. This way of eating has been shown to help prevent certain conditions and improve outcomes for people who have chronic diseases, like kidney disease and heart disease. What are tips  for following this plan? Lifestyle  Cook and eat meals together with your family, when possible. Drink enough fluid to keep your urine clear or pale yellow. Be physically active every day. This includes: Aerobic exercise like running or swimming. Leisure activities like gardening,  walking, or housework. Get 7-8 hours of sleep each night. If recommended by your health care provider, drink red wine in moderation. This means 1 glass a day for nonpregnant women and 2 glasses a day for men. A glass of wine equals 5 oz (150 mL). Reading food labels  Check the serving size of packaged foods. For foods such as rice and pasta, the serving size refers to the amount of cooked product, not dry. Check the total fat in packaged foods. Avoid foods that have saturated fat or trans fats. Check the ingredients list for added sugars, such as corn syrup. Shopping  At the grocery store, buy most of your food from the areas near the walls of the store. This includes: Fresh fruits and vegetables (produce). Grains, beans, nuts, and seeds. Some of these may be available in unpackaged forms or large amounts (in bulk). Fresh seafood. Poultry and eggs. Low-fat dairy products. Buy whole ingredients instead of prepackaged foods. Buy fresh fruits and vegetables in-season from local farmers markets. Buy frozen fruits and vegetables in resealable bags. If you do not have access to quality fresh seafood, buy precooked frozen shrimp or canned fish, such as tuna, salmon, or sardines. Buy small amounts of raw or cooked vegetables, salads, or olives from the deli or salad bar at your store. Stock your pantry so you always have certain foods on hand, such as olive oil, canned tuna, canned tomatoes, rice, pasta, and beans. Cooking  Cook foods with extra-virgin olive oil instead of using butter or other vegetable oils. Have meat as a side dish, and have vegetables or grains as your main dish. This means having meat in small portions or adding small amounts of meat to foods like pasta or stew. Use beans or vegetables instead of meat in common dishes like chili or lasagna. Experiment with different cooking methods. Try roasting or broiling vegetables instead of steaming or sauteing them. Add frozen vegetables  to soups, stews, pasta, or rice. Add nuts or seeds for added healthy fat at each meal. You can add these to yogurt, salads, or vegetable dishes. Marinate fish or vegetables using olive oil, lemon juice, garlic, and fresh herbs. Meal planning  Plan to eat 1 vegetarian meal one day each week. Try to work up to 2 vegetarian meals, if possible. Eat seafood 2 or more times a week. Have healthy snacks readily available, such as: Vegetable sticks with hummus. Greek yogurt. Fruit and nut trail mix. Eat balanced meals throughout the week. This includes: Fruit: 2-3 servings a day Vegetables: 4-5 servings a day Low-fat dairy: 2 servings a day Fish, poultry, or lean meat: 1 serving a day Beans and legumes: 2 or more servings a week Nuts and seeds: 1-2 servings a day Whole grains: 6-8 servings a day Extra-virgin olive oil: 3-4 servings a day Limit red meat and sweets to only a few servings a month What are my food choices? Mediterranean diet Recommended Grains: Whole-grain pasta. Brown rice. Bulgar wheat. Polenta. Couscous. Whole-wheat bread. [place]. Vegetables: Artichokes. Beets. Broccoli. Cabbage. Carrots. Eggplant. Green beans. Chard. Kale. Spinach. Onions. Leeks. Peas. Squash. Tomatoes. Peppers. Radishes. Fruits: Apples. Apricots. Avocado. Berries. Bananas. Cherries. Dates. Figs. Grapes. Lemons. Melon. Oranges. Peaches. Plums. Pomegranate. Meats and other  protein foods: Beans. Almonds. Sunflower seeds. Pine nuts. Peanuts. [place]. Salmon. Scallops. Shrimp. [place]. Tilapia. Clams. Oysters. Eggs. Dairy: Low-fat milk. Cheese. Greek yogurt. Beverages: Water. Red wine. Herbal tea. Fats and oils: Extra virgin olive oil. Avocado oil. Grape seed oil. Sweets and desserts: [place] yogurt with honey. Baked apples. Poached pears. Trail mix. Seasoning and other foods: Basil. Cilantro. Coriander. Cumin. Mint. Parsley. Sage. Rosemary. Tarragon. Garlic. Oregano. Thyme. Pepper. Balsalmic vinegar. Tahini.  Hummus. Tomato sauce. Olives. Mushrooms. Limit these Grains: Prepackaged pasta or rice dishes. Prepackaged cereal with added sugar. Vegetables: Deep fried potatoes (french fries). Fruits: Fruit canned in syrup. Meats and other protein foods: Beef. Pork. Lamb. Poultry with skin. Hot dogs. [place] Salines. Dairy: Ice cream. Sour cream. Whole milk. Beverages: Juice. Sugar-sweetened soft drinks. Beer. Liquor and spirits. Fats and oils: Butter. Canola oil. Vegetable oil. Beef fat (tallow). Lard. Sweets and desserts: Cookies. Cakes. Pies. Candy. Seasoning and other foods: Mayonnaise. Premade sauces and marinades. The items listed may not be a complete list. Talk with your dietitian about what dietary choices are right for you. Summary The Mediterranean diet includes both food and lifestyle choices. Eat a variety of fresh fruits and vegetables, beans, nuts, seeds, and whole grains. Limit the amount of red meat and sweets that you eat. Talk with your health care provider about whether it is safe for you to drink red wine in moderation. This means 1 glass a day for nonpregnant women and 2 glasses a day for men. A glass of wine equals 5 oz (150 mL). This information is not intended to replace advice given to you by your health care provider. Make sure you discuss any questions you have with your health care provider. Document Released: 07/11/2016 Document Revised: 08/13/2016 Document Reviewed: 07/11/2016 Elsevier Interactive Patient Education  2017 [employer]

## 2023-03-19 ENCOUNTER — Encounter: Payer: Self-pay | Admitting: Physician Assistant

## 2023-03-19 ENCOUNTER — Ambulatory Visit: Payer: Medicare Other | Admitting: Physician Assistant

## 2023-03-19 VITALS — BP 117/52 | Resp 20 | Ht 67.0 in | Wt 170.0 lb

## 2023-03-19 DIAGNOSIS — F0150 Vascular dementia without behavioral disturbance: Secondary | ICD-10-CM | POA: Diagnosis not present

## 2023-03-19 DIAGNOSIS — G309 Alzheimer's disease, unspecified: Secondary | ICD-10-CM | POA: Diagnosis not present

## 2023-03-19 DIAGNOSIS — F0280 Dementia in other diseases classified elsewhere without behavioral disturbance: Secondary | ICD-10-CM | POA: Diagnosis not present

## 2023-03-19 MED ORDER — MEMANTINE HCL 10 MG PO TABS: ORAL_TABLET | ORAL | 3 refills | Status: DC

## 2023-03-19 MED ORDER — DONEPEZIL HCL 5 MG PO TABS: 5.0000 mg | ORAL_TABLET | Freq: Every day | ORAL | 3 refills | Status: DC

## 2023-03-19 NOTE — Progress Notes (Signed)
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 RH male seen today in follow up for memory loss. Patient is currently on memantine 10 mg twice daily and donepezil 5 mg daily, tolerating well.  09/01/2021 MRI brain personally reviewed was remarkable for  moderate cerebral atrophy, small chronic left parietal infarct, without any acute intracranial abnormalities.  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 He is still able to perform his ADLs with assistance in view of decreased sight, not due to decreased performance.      Follow up in 6  months. Continue memantine 10 mg twice daily, side effects discussed Continue donepezil 5 mg daily, side effects discussed. Recommend good control of cardiovascular risk factors Continue to control mood as per PCP    Subjective:    This patient is accompanied in the office by with his wive  who supplements the history.  Previous records as well as any outside records available were reviewed prior to todays visit. Patient was last seen on 09/17/2022, at which time his MoCA blind was 9/22   Any changes in memory since last visit?   Memory is about the same, no significant changes.  "memory is about bits and pieces all over the place "-he says. He had speech therapy in the past, without significant improvement of his speech.  He likes to watch TV and movies and sports.  He is not very active outside of the house. He enjoys "Gunsmoke" and other westerns  repeats oneself?  Endorsed but "not that much" Disoriented when walking into a room?  Patient denies except occasionally not remembering what patient came to the room for   Leaving objects in unusual places?    denies   Wandering behavior?  denies.  He cannot walk alone outside because of decreased sight. Any personality changes since last visit?  denies   Any worsening depression?:  denies    Hallucinations or paranoia?  denies   Seizures?    denies    Any sleep changes? He sleeps well unless he has to get up to go to the bathroom to urinate. Denies vivid dreams, REM behavior or sleepwalking   Sleep apnea?   denies   Any hygiene concerns?    denies   Independent of bathing and dressing?  He needs assistance because of his poor vision. Does the patient needs help with medications?  Wife is in charge   Who is in charge of the finances?  Wife is in charge     Any changes in appetite?" Eats well"  denies     Patient have trouble swallowing?  denies   Does the patient cook?  Any kitchen accidents such as leaving the stove on? Patient denies   Any headaches?   denies   Chronic back pain  denies   Ambulates with difficulty?  denies . Does not use a cane or walker, wife assists him to ambulate  Recent falls or head injuries? denies     Unilateral weakness, numbness or tingling?    denies   Any tremors?  denies   Any anosmia?  Patient denies   Any incontinence of urine?  "Occasional leakage, may need a change of underwear" Any bowel dysfunction?   Had a recent bout of diarrhea but self resolved. "Goes every other day"      Patient lives with his wife      Initial visit  09/26/21 The patient is seen in neurologic consultation at the request of Rodney Forster, MD for the evaluation of memory.  The patient is accompanied by his wife who supplements the history. This is a 82 y.o. year old male who has had memory issues for about 3-1/2 years, worse over the last 2 years, especially during the COVID pandemic.  He repeats the same questions, sometimes he "does not know where he is ".  He also repeats the same sentences.  He lives with his wife, who noticed these changes as well.  His mood is good, denies depression or irritability.  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  He "reads very little "because he is eyesight is very poor.  He has  issues with insomnia, sleeping 1 hour at the time, "gets up and goes to the bathroom, and a good night he will wake up once ".  He sleeps about 6 hours.  He denies any vivid dreams, or sleepwalking.  He denies any hallucinations or paranoia.  He denies leaving objects in unusual places.  He is independent of bathing and dressing.  Wife administers the medication and manages the finances.  His appetite is "okay", eating 3 meals a day, denies trouble swallowing.  He does not cook, but can make a sandwich. Ambulates  without a walker, unless needed, denies any falls or head injuries.  He stopped driving in the [address] due to eyesight difficulties.  He denies any headaches, dizziness, focal numbness or tingling, unilateral weakness or tremors.  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  No history of seizures.  Denies constipation or diarrhea.  He has a history of urine incontinence.  He denies a history of sleep apnea, alcohol or tobacco.  Family history remarkable for aunt with dementia of unknown type.     MRI brain wo contrast 09/01/21  1. No acute intracranial abnormality. 2. Progressive, moderate cerebral atrophy. 3. Small chronic left parietal infarct.   B12 and TSH at the PCPs office were normal PREVIOUS MEDICATIONS:   CURRENT MEDICATIONS:  Outpatient Encounter Medications as of 03/19/2023  Medication Sig   dorzolamide (TRUSOPT) 2 % ophthalmic solution Place 1 drop into both eyes 2 (two) times daily as needed.   dorzolamide-timolol (COSOPT) 22.3-6.8 MG/ML ophthalmic solution Place 1 drop into the left eye at bedtime.   doxazosin (CARDURA) 8 MG tablet Take 8 mg by mouth at bedtime.     metoprolol (LOPRESSOR) 50 MG tablet Take 50 mg by mouth 2 (two) times daily.     potassium chloride SA (K-DUR,KLOR-CON) 20 MEQ tablet    simvastatin (ZOCOR) 20 MG tablet Take 20 mg by mouth every evening.   torsemide (DEMADEX) 20 MG tablet 20 mg daily.    [DISCONTINUED] donepezil (ARICEPT) 5 MG tablet  Take 1 tablet (5 mg total) by mouth at bedtime.   [DISCONTINUED] memantine (NAMENDA) 10 MG tablet Take 1 tablet (10 mg) twice a day   amLODipine (NORVASC) 10 MG tablet Take 10 mg by mouth daily. (Patient not taking: Reported on 09/17/2022)   Ascorbic Acid (VITAMIN C) 500 MG tablet Take 500 mg by mouth daily.  (Patient not taking: Reported on 09/17/2021)   B Complex Vitamins (B COMPLEX 100 PO) Take by mouth daily.   (Patient not taking: Reported on 09/17/2021)   beta carotene 25956 UNIT capsule Take 25,000 Units by mouth daily.   (Patient not taking: Reported on 09/17/2021)   Bilberry  500 MG CAPS Take 500 mg by mouth daily.  (Patient not taking: Reported on 09/17/2021)   bimatoprost (LUMIGAN) 0.03 % ophthalmic drops Place 1 drop into both eyes at bedtime.   (Patient not taking: Reported on 03/19/2023)   Calcium-Magnesium-Zinc (CAL-MAG-ZINC PO) Take by mouth as directed.   (Patient not taking: Reported on 09/17/2022)   Chelated Zinc 50 MG TABS Take by mouth daily.   (Patient not taking: Reported on 09/17/2021)   Cinnamon 500 MG capsule Take 1,000 mg by mouth daily.  (Patient not taking: Reported on 09/17/2021)   Coenzyme Q10 (CO Q 10) 100 MG CAPS Take by mouth daily.   (Patient not taking: Reported on 09/17/2021)   donepezil (ARICEPT) 5 MG tablet Take 1 tablet (5 mg total) by mouth at bedtime.   fish oil-omega-3 fatty acids 1000 MG capsule Take 2 g by mouth 3 (three) times daily.   (Patient not taking: Reported on 09/17/2021)   folic acid (FOLVITE) 1 MG tablet Take 1 mg by mouth daily.   (Patient not taking: Reported on 09/17/2022)   Garlic 1000 MG CAPS Take by mouth daily. (Patient not taking: Reported on 09/17/2022)   Ginkgo Biloba 40 MG TABS Take by mouth daily.   (Patient not taking: Reported on 09/17/2021)   lisinopril (PRINIVIL,ZESTRIL) 40 MG tablet Take 40 mg by mouth 2 (two) times daily.  (Patient not taking: Reported on 03/19/2023)   LUTEIN PO Take by mouth 2 (two) times daily.   (Patient not  taking: Reported on 09/17/2022)   Melatonin 1 MG CAPS Take by mouth at bedtime.   memantine (NAMENDA) 10 MG tablet Take 1 tablet (10 mg) twice a day   Multiple Vitamin (MULTIVITAMIN) tablet Take 1 tablet by mouth daily.   (Patient not taking: Reported on 09/17/2021)   No facility-administered encounter medications on file as of 03/19/2023.        No data to display            03/19/2023   11:00 AM 09/23/2022    1:00 PM 09/17/2022   11:00 AM 09/17/2021   10:00 AM  Montreal Cognitive Assessment   Attention: Read list of digits (0/2) 2 2 2 2   Attention: Read list of letters (0/1) 1 1 1 1   Attention: Serial 7 subtraction starting at 100 (0/3) 2 3 3 3   Language: Repeat phrase (0/2) 0 1 1 1   Language : Fluency (0/1) 0 0 0 1  Abstraction (0/2) 0 1 1 2   Delayed Recall (0/5) 2 0 0 0  Orientation (0/6) 1 1 1 2     Objective:     PHYSICAL EXAMINATION:    VITALS:   Vitals:   03/19/23 1049  BP: (!) 117/52  Resp: 20  Weight: 170 lb (77.1 kg)  Height: 5\' 7"  (1.702 m)    GEN:  The patient appears stated age and is in NAD. HEENT:  Normocephalic, atraumatic.   Neurological examination:  General: NAD, well-groomed, appears stated age. Orientation: The patient is alert. Oriented to person, not to place and date Cranial nerves: There is good facial symmetry.The speech is fluent and clear. No aphasia or dysarthria. Fund of knowledge is appropriate. Recent and remote memory are impaired. Attention and concentration are reduced.  Unable to name objects due to poor sight.  Unable to repeat phrases.  Hearing is intact to conversational tone.   Sensation: Sensation is intact to light touch throughout Motor: Strength is at least antigravity x4. DTR's 2/4 in UE/LE  Movement examination: Tone: There is normal tone in the UE/LE Abnormal movements:  no tremor.  No myoclonus.  No asterixis.   Coordination:  There is no decremation with RAM's. Normal finger to nose  Gait and Station: The  patient has no difficulty arising out of a deep-seated chair without the use of the hands. The patient's stride length is good.  Gait is cautious and narrow.    Thank you for allowing [place] the opportunity to participate in the care of this nice patient. Please do not hesitate to contact us for any questions or concerns.   Total time spent on today's visit was 31 minutes dedicated to this patient today, preparing to see patient, examining the patient, ordering tests and/or medications and counseling the patient, documenting clinical information in the EHR or other health record, independently interpreting results and communicating results to the patient/family, discussing treatment and goals, answering patient's questions and coordinating care.  Cc:  Penelope Galas, MD  Marlowe Kays 03/19/2023 11:42 AM

## 2023-03-19 NOTE — Patient Instructions (Addendum)
It was a pleasure to see you today at our office.   Recommendations:  Continue Memantine 10mg  tablets 1 tablet twice daily.  Take donepezil  5 mg nightly  Follow up in 6 months   RECOMMENDATIONS FOR ALL PATIENTS WITH MEMORY PROBLEMS: 1. Continue to exercise (Recommend 30 minutes of walking everyday, or 3 hours every week) 2. Increase social interactions - continue going to [place] and enjoy social gatherings with friends and family 3. Eat healthy, avoid fried foods and eat more fruits and vegetables 4. Maintain adequate blood pressure, blood sugar, and blood cholesterol level. Reducing the risk of stroke and cardiovascular disease also helps promoting better memory. 5. Avoid stressful situations. Live a simple life and avoid aggravations. Organize your time and prepare for the next day in anticipation. 6. Sleep well, avoid any interruptions of sleep and avoid any distractions in the bedroom that may interfere with adequate sleep quality 7. Avoid sugar, avoid sweets as there is a strong link between excessive sugar intake, diabetes, and cognitive impairment We discussed the Mediterranean diet, which has been shown to help patients reduce the risk of progressive memory disorders and reduces cardiovascular risk. This includes eating fish, eat fruits and green leafy vegetables, nuts like almonds and hazelnuts, walnuts, and also use olive oil. Avoid fast foods and fried foods as much as possible. Avoid sweets and sugar as sugar use has been linked to worsening of memory function.  There is always a concern of gradual progression of memory problems. If this is the case, then we may need to adjust level of care according to patient needs. Support, both to the patient and caregiver, should then be put into place.    FALL PRECAUTIONS: Be cautious when walking. Scan the area for obstacles that may increase the risk of trips and falls. When getting up in the mornings, sit up at the edge of the bed for a  few minutes before getting out of bed. Consider elevating the bed at the head end to avoid drop of blood pressure when getting up. Walk always in a well-lit room (use night lights in the walls). Avoid area rugs or power cords from appliances in the middle of the walkways. Use a walker or a cane if necessary and consider physical therapy for balance exercise. Get your eyesight checked regularly.     HOME SAFETY: Consider the safety of the kitchen when operating appliances like stoves, microwave oven, and blender. Consider having supervision and share cooking responsibilities until no longer able to participate in those. Accidents with firearms and other hazards in the house should be identified and addressed as well.   ABILITY TO BE LEFT ALONE: If patient is unable to contact 911 operator, consider using LifeLine, or when the need is there, arrange for someone to stay with patients. Smoking is a fire hazard, consider supervision or cessation. Risk of wandering should be assessed by caregiver and if detected at any point, supervision and safe proof recommendations should be instituted.  MEDICATION SUPERVISION: Inability to self-administer medication needs to be constantly addressed. Implement a mechanism to ensure safe administration of the medications.       Mediterranean Diet A Mediterranean diet refers to food and lifestyle choices that are based on the traditions of countries located on the [employer]. This way of eating has been shown to help prevent certain conditions and improve outcomes for people who have chronic diseases, like kidney disease and heart disease. What are tips for following this plan? Lifestyle  Cook and eat meals together with your family, when possible. Drink enough fluid to keep your urine clear or pale yellow. Be physically active every day. This includes: Aerobic exercise like running or swimming. Leisure activities like gardening, walking, or housework. Get  7-8 hours of sleep each night. If recommended by your health care provider, drink red wine in moderation. This means 1 glass a day for nonpregnant women and 2 glasses a day for men. A glass of wine equals 5 oz (150 mL). Reading food labels  Check the serving size of packaged foods. For foods such as rice and pasta, the serving size refers to the amount of cooked product, not dry. Check the total fat in packaged foods. Avoid foods that have saturated fat or trans fats. Check the ingredients list for added sugars, such as corn syrup. Shopping  At the grocery store, buy most of your food from the areas near the walls of the store. This includes: Fresh fruits and vegetables (produce). Grains, beans, nuts, and seeds. Some of these may be available in unpackaged forms or large amounts (in bulk). Fresh seafood. Poultry and eggs. Low-fat dairy products. Buy whole ingredients instead of prepackaged foods. Buy fresh fruits and vegetables in-season from local farmers markets. Buy frozen fruits and vegetables in resealable bags. If you do not have access to quality fresh seafood, buy precooked frozen shrimp or canned fish, such as tuna, salmon, or sardines. Buy small amounts of raw or cooked vegetables, salads, or olives from the deli or salad bar at your store. Stock your pantry so you always have certain foods on hand, such as olive oil, canned tuna, canned tomatoes, rice, pasta, and beans. Cooking  Cook foods with extra-virgin olive oil instead of using butter or other vegetable oils. Have meat as a side dish, and have vegetables or grains as your main dish. This means having meat in small portions or adding small amounts of meat to foods like pasta or stew. Use beans or vegetables instead of meat in common dishes like chili or lasagna. Experiment with different cooking methods. Try roasting or broiling vegetables instead of steaming or sauteing them. Add frozen vegetables to soups, stews, pasta, or  rice. Add nuts or seeds for added healthy fat at each meal. You can add these to yogurt, salads, or vegetable dishes. Marinate fish or vegetables using olive oil, lemon juice, garlic, and fresh herbs. Meal planning  Plan to eat 1 vegetarian meal one day each week. Try to work up to 2 vegetarian meals, if possible. Eat seafood 2 or more times a week. Have healthy snacks readily available, such as: Vegetable sticks with hummus. Greek yogurt. Fruit and nut trail mix. Eat balanced meals throughout the week. This includes: Fruit: 2-3 servings a day Vegetables: 4-5 servings a day Low-fat dairy: 2 servings a day Fish, poultry, or lean meat: 1 serving a day Beans and legumes: 2 or more servings a week Nuts and seeds: 1-2 servings a day Whole grains: 6-8 servings a day Extra-virgin olive oil: 3-4 servings a day Limit red meat and sweets to only a few servings a month What are my food choices? Mediterranean diet Recommended Grains: Whole-grain pasta. Brown rice. Bulgar wheat. Polenta. Couscous. Whole-wheat bread. Orpah Cobb. Vegetables: Artichokes. Beets. Broccoli. Cabbage. Carrots. Eggplant. Green beans. Chard. Kale. Spinach. Onions. Leeks. Peas. Squash. Tomatoes. Peppers. Radishes. Fruits: Apples. Apricots. Avocado. Berries. Bananas. Cherries. Dates. Figs. Grapes. Lemons. Melon. Oranges. Peaches. Plums. Pomegranate. Meats and other protein foods: Beans. Almonds. Sunflower seeds.  Pine nuts. Peanuts. Cod. Salmon. Scallops. Shrimp. Tuna. Tilapia. Clams. Oysters. Eggs. Dairy: Low-fat milk. Cheese. Greek yogurt. Beverages: Water. Red wine. Herbal tea. Fats and oils: Extra virgin olive oil. Avocado oil. Grape seed oil. Sweets and desserts: [place] yogurt with honey. Baked apples. Poached pears. Trail mix. Seasoning and other foods: Basil. Cilantro. Coriander. Cumin. Mint. Parsley. Sage. Rosemary. Tarragon. Garlic. Oregano. Thyme. Pepper. Balsalmic vinegar. Tahini. Hummus. Tomato sauce. Olives.  Mushrooms. Limit these Grains: Prepackaged pasta or rice dishes. Prepackaged cereal with added sugar. Vegetables: Deep fried potatoes (french fries). Fruits: Fruit canned in syrup. Meats and other protein foods: Beef. Pork. Lamb. Poultry with skin. Hot dogs. Tomasa Blase. Dairy: Ice cream. Sour cream. Whole milk. Beverages: Juice. Sugar-sweetened soft drinks. Beer. Liquor and spirits. Fats and oils: Butter. Canola oil. Vegetable oil. Beef fat (tallow). Lard. Sweets and desserts: Cookies. Cakes. Pies. Candy. Seasoning and other foods: Mayonnaise. Premade sauces and marinades. The items listed may not be a complete list. Talk with your dietitian about what dietary choices are right for you. Summary The Mediterranean diet includes both food and lifestyle choices. Eat a variety of fresh fruits and vegetables, beans, nuts, seeds, and whole grains. Limit the amount of red meat and sweets that you eat. Talk with your health care provider about whether it is safe for you to drink red wine in moderation. This means 1 glass a day for nonpregnant women and 2 glasses a day for men. A glass of wine equals 5 oz (150 mL). This information is not intended to replace advice given to you by your health care provider. Make sure you discuss any questions you have with your health care provider. Document Released: 07/11/2016 Document Revised: 08/13/2016 Document Reviewed: 07/11/2016 Elsevier Interactive Patient Education  2017 [employer].    Continue

## 2023-07-17 ENCOUNTER — Ambulatory Visit: Payer: Medicare Other | Admitting: Podiatry

## 2023-07-17 ENCOUNTER — Encounter: Payer: Self-pay | Admitting: Podiatry

## 2023-07-17 DIAGNOSIS — M79675 Pain in left toe(s): Secondary | ICD-10-CM | POA: Diagnosis not present

## 2023-07-17 DIAGNOSIS — B351 Tinea unguium: Secondary | ICD-10-CM | POA: Diagnosis not present

## 2023-07-17 DIAGNOSIS — M79674 Pain in right toe(s): Secondary | ICD-10-CM

## 2023-07-17 DIAGNOSIS — M2041 Other hammer toe(s) (acquired), right foot: Secondary | ICD-10-CM | POA: Diagnosis not present

## 2023-07-17 DIAGNOSIS — L84 Corns and callosities: Secondary | ICD-10-CM

## 2023-07-17 NOTE — Progress Notes (Signed)
  Subjective:  Patient ID: Rodney Christensen, male    DOB: Oct 05, 1941,  MRN: [id number]  Chief Complaint  Patient presents with   Nail Problem    "He's got three toes that I'm afraid to cut." N - Toenails L - 2nd rt, 3rd b/l D - 60 yrs O - gradually worse C - very sensitive, fungus A - shoes sometime if walking a lot, sometime touch T - saw a doctor in [place] for fungus    82 y.o. male presents with the above complaint. History confirmed with patient.  2 toenails on the right side with a callus there as well as well as the left third toe  Objective:  Physical Exam: warm, good capillary refill, no trophic changes or ulcerative lesions, normal DP and PT pulses, normal sensory exam, and multiple dystrophic mycotic bilateral third and right second toenails with subungual debris pain to palpation.  Second toenail has a callus on the inferior margin, he has hammertoe deformities bilateral..  Assessment:   1. Pain due to onychomycosis of toenails of both feet   2. Callus of foot   3. Hammertoe of right foot      Plan:  Patient was evaluated and treated and all questions answered.  Discussed the etiology and treatment options for the condition in detail with the patient. Recommended debridement of the nails today. Sharp and mechanical debridement performed of all painful and mycotic nails today. Nails debrided in length and thickness using a nail nipper to level of comfort. Discussed treatment options including appropriate shoe gear. Follow up as needed for painful nails.   Callus debrided as a courtesy as well.  I applied silicone padding and toe crest for this as well.  Return in about 3 months (around 10/17/2023).

## 2023-07-21 ENCOUNTER — Ambulatory Visit: Payer: Medicare Other | Attending: Internal Medicine | Admitting: Physical Therapy

## 2023-07-21 ENCOUNTER — Encounter: Payer: Self-pay | Admitting: Physical Therapy

## 2023-07-21 DIAGNOSIS — M6281 Muscle weakness (generalized): Secondary | ICD-10-CM | POA: Insufficient documentation

## 2023-07-21 DIAGNOSIS — R2681 Unsteadiness on feet: Secondary | ICD-10-CM | POA: Insufficient documentation

## 2023-07-21 DIAGNOSIS — R2689 Other abnormalities of gait and mobility: Secondary | ICD-10-CM | POA: Diagnosis present

## 2023-07-21 NOTE — Therapy (Signed)
OUTPATIENT PHYSICAL THERAPY THORACOLUMBAR EVALUATION   Patient Name: Rodney Christensen MRN: [id number] DOB:1941/01/06, 82 y.o., male Today's Date: 07/21/2023  END OF SESSION:  PT End of Session - 07/21/23 1413     Visit Number 1    Number of Visits 16    Date for PT Re-Evaluation 09/15/23    Authorization Type [employer] MCR    PT Start Time 1410    PT Stop Time 1500    PT Time Calculation (min) 50 min    Activity Tolerance Patient tolerated treatment well    Behavior During Therapy WFL for tasks assessed/performed             Past Medical History:  Diagnosis Date   BPH (benign prostatic hyperplasia)    CAD, NATIVE VESSEL 03/22/2010   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   Diverticulosis    History of radiation therapy 03/18/11 to 04/19/11   lower left lung   HYPERLIPIDEMIA-MIXED 03/17/2009   HYPERTENSION, UNSPECIFIED 03/17/2009   Macular degeneration    Skin cancer    melanoma-nose   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    Past Surgical History:  Procedure Laterality Date   BRONCHOSCOPY  06/11/11   [facility]   CAROTID ENDARTERECTOMY     CORONARY ARTERY BYPASS GRAFT  10/10/2004   x5   Lt VATS,Lt thoacotomy,Lt lower lobectomy with  mediastinal node  dissection  06/11/11   [facility]   Patient Active Problem List   Diagnosis Date Noted   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   Malignant neoplasm of lower lobe, bronchus, or lung 03/27/2012   History of radiation therapy    Skin cancer    BPH (benign prostatic hyperplasia)    Squamous cell carcinoma lung, Left lower lobe    CAD, NATIVE VESSEL 03/22/2010   HYPERLIPIDEMIA-MIXED 03/17/2009   HYPERTENSION, UNSPECIFIED 03/17/2009   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   CAROTID ENDARTERECTOMY, LEFT, HX OF 03/17/2009    PCP: Effie Shy MD   REFERRING PROVIDER: Penelope Galas, MD  REFERRING DIAG: M62.81 (ICD-10-CM) - Muscle weakness (generalized)  Rationale for Evaluation and Treatment:  Rehabilitation  THERAPY DIAG:  Other abnormalities of gait and mobility  Unsteadiness on feet  Muscle weakness (generalized)  ONSET DATE: chronic   SUBJECTIVE:                                                                                                                                                                                           SUBJECTIVE STATEMENT: Pt's wife present for PT eval.  Wife states the patient has difficulty with mobility in the home.  He reports he is tired all the time.  He is overall weak and feels over the past few years he has gotten weaker.  Limited by progressing dementia and memory loss.  When he walks she has to watch him, she says he moves quickly and appears unsteady.  He reports dizziness at times if he moves too quickly.  At times he feels weak in his legs, like they aren't responding. He walks with a cane and has been using it for 4 mos.  He uses the cane in the home.  He is open to using a walker and his wife would like to get a rollator vs a standard walker.  Occasional he gets back pain but that is not daily.    PERTINENT HISTORY:  Mixed dementia/vascular with decreased memory Visual deficits HTN, kidney disease  Melanoma, lung cancer    PAIN:  Are you having pain? Yes: NPRS scale: none /10 Pain location: back (low) Pain description: aching  Aggravating factors: unsure  Relieving factors: pain meds   PRECAUTIONS: None and Fall  RED FLAGS: None   WEIGHT BEARING RESTRICTIONS: No  FALLS:  Has patient fallen in last 6 months? No  Pt is unsteady and is aware of his balance deficits.   LIVING ENVIRONMENT: Lives with: lives with their spouse Lives in: House/apartment Stairs: Yes: External: 5 steps; on right going up Has following equipment at home: Single point cane also has a walker (RW)   OCCUPATION: retired   PLOF: Independent with basic ADLs, Independent with community mobility with device, Needs assistance with ADLs, Needs  assistance with homemaking, Vocation/Vocational requirements: Was working in [employer], and Leisure: likes baseball and listening to music   PATIENT GOALS: I would like to be more independent if I can.    NEXT MD VISIT: Unknown  OBJECTIVE:   DIAGNOSTIC FINDINGS:  Brain MR 2022: No acute intracranial abnormality. 2. Progressive, moderate cerebral atrophy. 3. Small chronic left parietal infarct.  PATIENT SURVEYS:  FOTO 42% , goal 53%  SCREENING FOR RED FLAGS: Bowel or bladder incontinence: No Spinal tumors: No Cauda equina syndrome: No Compression fracture: No Abdominal aneurysm: No  COGNITION: Overall cognitive status: History of cognitive impairments - at baseline     SENSATION: WFL  MUSCLE LENGTH:  NT  POSTURE: rounded shoulders, forward head, and increased thoracic kyphosis  PALPATION: NT  LUMBAR ROM: Not full tested AROM eval  Flexion   Extension   Right lateral flexion   Left lateral flexion   Right rotation WFL in standing   Left rotation WFL in standing    (Blank rows = not tested)  LOWER EXTREMITY ROM:   NT on eval   Passive  Right eval Left eval  Hip flexion    Hip extension    Hip abduction    Hip adduction    Hip internal rotation    Hip external rotation    Knee flexion    Knee extension    Ankle dorsiflexion    Ankle plantarflexion    Ankle inversion    Ankle eversion     (Blank rows = not tested)  LOWER EXTREMITY MMT:    MMT Right eval Left eval  Hip flexion 4+ 4-  Hip extension    Hip abduction    Hip adduction    Hip internal rotation    Hip external rotation    Knee flexion 5 5  Knee extension 5 4+  Ankle dorsiflexion 4 5  Ankle plantarflexion  Ankle inversion    Ankle eversion     (Blank rows = not tested)   UE AROM overhead to about 120 deg, increased postural sway UE strength in shoulder flexion and abduction  5/5 Grip strength Rt 18, Lt 17 lbs    FUNCTIONAL TESTS:  5 times sit to stand: 29 sec  2  minute walk test: 131 feet Berg Balance Scale: NT on eval  Step tap test > 40 sec each LE x 5   GAIT: Distance walked: 131 Assistive device utilized: Single point cane Level of assistance: Min A Comments: Patient walked with a single-point cane 131 feet with minimal assist for steadying.  Patient demonstrates variable step length gait speed. Takes brief pauses then takes larger steps.   TODAY'S TREATMENT:                                                                                                                              DATE: 07/31/23    PATIENT EDUCATION:  Education details: PT, plan of care, rolling walker versus rollator, assessment findings, prognosis Person educated: Patient and Spouse Education method: Explanation and Verbal cues Education comprehension: verbalized understanding  HOME EXERCISE PROGRAM: N/A evaluation  ASSESSMENT:  CLINICAL IMPRESSION: Patient is a 82 y.o. male who was seen today for physical therapy evaluation and treatment for gait instability, balance deficits and generalized weakness.   OBJECTIVE IMPAIRMENTS: Abnormal gait, decreased activity tolerance, decreased balance, decreased cognition, decreased coordination, decreased endurance, decreased mobility, difficulty walking, impaired UE functional use, postural dysfunction, obesity, and pain.   ACTIVITY LIMITATIONS: carrying, lifting, bending, standing, squatting, stairs, transfers, bed mobility, bathing, dressing, reach over head, hygiene/grooming, and locomotion level  PARTICIPATION LIMITATIONS: medication management, personal finances, driving, shopping, and community activity  PERSONAL FACTORS: Age, Time since onset of injury/illness/exacerbation, and 2+ comorbidities: Visual impairment, cognitive impairment,  are also affecting patient's functional outcome.   REHAB POTENTIAL: Good  CLINICAL DECISION MAKING: Evolving/moderate complexity  EVALUATION COMPLEXITY: Moderate   GOALS: Goals  reviewed with patient? Yes  SHORT TERM GOALS: Target date: 08/18/2023    Patient will be screened for balance deficits and goals to be assessed Baseline: Goal status: INITIAL  2.  Patient will participate in home exercise program 3 days/week with assistance of spouse Baseline:  Goal status: INITIAL  3.  Patient will ambulate 150 feet using walker with close supervision Baseline:  Goal status: INITIAL    LONG TERM GOALS: Target date: 09/15/2023    Pt will be able to walk 300 feet in a controlled environment with supervision, min cues  Baseline:  Goal status: INITIAL  2.  Patient will be independent with home program with the help of spouse for balance strength Baseline:  Goal status: INITIAL  3.  Patient will be able to complete 200 feet for 2-minute walk test using rollator walker show improved endurance Baseline:  Goal status: INITIAL  4.  Balance goal to be determined based on standardized test (TUG,  Berg) Baseline:  Goal status: INITIAL  5.  Patient and wife will report improved alertness and energy levels to allow time for TV, radio.  Baseline:  Goal status: INITIAL  6. FOTO score will improve to 53% or better to demo improved functional mobility   Baseline: 42%   Goal: 53% PLAN:  PT FREQUENCY: 1-2x/week  PT DURATION: 8 weeks  PLANNED INTERVENTIONS: Therapeutic exercises, Therapeutic activity, Neuromuscular re-education, Balance training, Gait training, Patient/Family education, Self Care, Stair training, DME instructions, and Re-evaluation.  PLAN FOR NEXT SESSION: Berg balance test, tug, develop HEP, gait with walker   Roldan Laforest, PT 07/21/2023, 7:15 PM    Karie Mainland, PT 07/21/23 8:40 PM Phone: [phone] Fax: [phone]

## 2023-07-30 ENCOUNTER — Ambulatory Visit: Payer: Medicare Other | Admitting: Physical Therapy

## 2023-07-30 DIAGNOSIS — R2689 Other abnormalities of gait and mobility: Secondary | ICD-10-CM

## 2023-07-30 DIAGNOSIS — R2681 Unsteadiness on feet: Secondary | ICD-10-CM

## 2023-07-30 NOTE — Therapy (Signed)
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   Diverticulosis    History of radiation therapy 03/18/11 to 04/19/11   lower left lung   HYPERLIPIDEMIA-MIXED 03/17/2009   HYPERTENSION, UNSPECIFIED 03/17/2009   Macular degeneration    Skin cancer    melanoma-nose   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    Past Surgical History:  Procedure Laterality Date   BRONCHOSCOPY  06/11/11   [facility]   CAROTID ENDARTERECTOMY     CORONARY ARTERY BYPASS GRAFT  10/10/2004   x5   Lt VATS,Lt thoacotomy,Lt lower lobectomy with  mediastinal node  dissection  06/11/11   [facility]   Patient Active Problem List   Diagnosis Date Noted   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   Malignant neoplasm of lower lobe, bronchus, or lung 03/27/2012   History of radiation therapy    Skin cancer    BPH (benign prostatic hyperplasia)    Squamous cell carcinoma lung, Left lower lobe    CAD, NATIVE VESSEL 03/22/2010   HYPERLIPIDEMIA-MIXED 03/17/2009   HYPERTENSION, UNSPECIFIED 03/17/2009   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 Wife reports she has left message for MD about RW and has not heard back.   Pt's wife present for PT eval.  Wife states the patient has difficulty with mobility in  the home.  He reports he is tired all the time.  He is overall weak and feels over the past few years he has gotten weaker.  Limited by progressing dementia and memory loss.  When he walks she has to watch him, she says he moves quickly and appears unsteady.  He reports dizziness at times if he moves too quickly.  At times he feels weak in his legs, like they aren't responding. He walks with a cane and has been using it for 4 mos.  He uses the cane in the home.  He is open to using a walker and his wife would like to get a rollator vs a standard walker.  Occasional he gets back pain but that is not daily.    PERTINENT HISTORY:  Mixed dementia/vascular with decreased memory Visual deficits HTN, kidney disease  Melanoma, lung cancer    PAIN:  Are you having pain? Yes: NPRS scale: none /10 Pain location: back (low) Pain description: aching  Aggravating factors: unsure  Relieving factors: pain meds   PRECAUTIONS: None and Fall  RED FLAGS: None   WEIGHT BEARING RESTRICTIONS: No  FALLS:  Has patient fallen in last 6 months? No  Pt is unsteady and is aware of his balance deficits.   LIVING ENVIRONMENT: Lives with: lives with their spouse Lives in: House/apartment Stairs: Yes: External: 5 steps; on right going up Has following equipment at home: Single point cane also has a walker (RW)   OCCUPATION: retired   PLOF: Independent with basic ADLs, Independent with community mobility with device, Needs assistance with ADLs, Needs assistance with homemaking,  Vocation/Vocational requirements: Was working in [employer], and Leisure: likes baseball and listening to music   PATIENT GOALS: I would like to be more independent if I can.    NEXT MD VISIT: Unknown  OBJECTIVE:   DIAGNOSTIC FINDINGS:  Brain MR 2022: No acute intracranial abnormality. 2. Progressive, moderate cerebral atrophy. 3. Small chronic left parietal infarct.  PATIENT SURVEYS:  FOTO 42% , goal 53%  SCREENING FOR RED FLAGS: Bowel or bladder incontinence: No Spinal tumors: No Cauda equina syndrome: No Compression fracture: No Abdominal aneurysm: No  COGNITION: Overall cognitive status: History of cognitive impairments - at baseline     SENSATION: WFL  MUSCLE LENGTH:  NT  POSTURE: rounded shoulders, forward head, and increased thoracic kyphosis  PALPATION: NT  LUMBAR ROM: Not full tested AROM eval  Flexion   Extension   Right lateral flexion   Left lateral flexion   Right rotation WFL in standing   Left rotation WFL in standing    (Blank rows = not tested)  LOWER EXTREMITY ROM:   NT on eval   Passive  Right eval Left eval  Hip flexion    Hip extension    Hip abduction    Hip adduction    Hip internal rotation    Hip external rotation    Knee flexion    Knee extension    Ankle dorsiflexion    Ankle plantarflexion    Ankle inversion    Ankle eversion     (Blank rows = not tested)  LOWER EXTREMITY MMT:    MMT Right eval Left eval  Hip flexion 4+ 4-  Hip extension    Hip abduction    Hip adduction    Hip internal rotation    Hip external rotation    Knee flexion 5 5  Knee extension 5 4+  Ankle dorsiflexion 4 5  Ankle plantarflexion  Ankle inversion    Ankle eversion     (Blank rows = not tested)   UE AROM overhead to about 120 deg, increased postural sway UE strength in shoulder flexion and abduction  5/5 Grip strength Rt 18, Lt 17 lbs    FUNCTIONAL TESTS:  5 times sit to stand: 29 sec  2 minute walk test: 131 feet Berg  Balance Scale: NT on eval  Step tap test > 40 sec each LE x 5  07/29/23: 33.6 sec TUG with RW (clinic walker with slides)  07/29/23: 102 feet using clinic RW  GAIT: Distance walked: 131 Assistive device utilized: Single point cane Level of assistance: Min A Comments: Patient walked with a single-point cane 131 feet with minimal assist for steadying.  Patient demonstrates variable step length gait speed. Takes brief pauses then takes larger steps.   TODAY'S TREATMENT:                                                                                                                              [facility] Adult PT Treatment:                                                DATE: 07/30/23  Therapeutic Activity: BERG BALANCE TEST Sitting to Standing: 4.      Stands without using hands and stabilize independently Standing Unsupported: 3.      Stands 2 minutes with supervision Sitting Unsupported: 4.     Sits for 2 minutes independently Standing to Sitting: 4.     Sits safely with minimal use of hands Transfers: 3.     Transfers safely definite use of hands Standing with eyes closed: 4.     Stands safely for 10 seconds  Standing with feet together: 3.     Stands for 1 minute with supervision Reaching forward with outstretched arm: 3.     Reaches forward 5 inches Retrieving object from the floor: 3.     Able to pick up with supervision Turning to look behind: 4.     Looks behind from both sides and weight shifts well Turning 360 degrees: 1.     Needs supervision or verbal cueing Place alternate foot on stool: 0.     Unable, needs assist to keep from falling Standing with one foot in front: 2.     Independent small step for 30 seconds Standing on one foot: 1.     Holds <3 seconds  Total Score: 39/56   TUG with RW with RW- cues for safety and transfers     DATE: 07/31/23    PATIENT EDUCATION:  Education details: PT, plan of care, rolling walker versus rollator, assessment findings,  prognosis Person educated: Patient and Spouse Education method: Explanation and Verbal cues Education comprehension: verbalized understanding  HOME EXERCISE PROGRAM:  N/A evaluation  ASSESSMENT:  CLINICAL IMPRESSION: Time spent capturing BERG (39/56) and introducing RW. Worked on Field seismologist with RW and he requires mod cues for hand placement with transfers and backing up to chairs with RW. Wife has contacted MD for RW referral and has not heard back from them. Will plan to establish HEP next visit.    EVAL: Patient is a 82 y.o. male who was seen today for physical therapy evaluation and treatment for gait instability, balance deficits and generalized weakness.   OBJECTIVE IMPAIRMENTS: Abnormal gait, decreased activity tolerance, decreased balance, decreased cognition, decreased coordination, decreased endurance, decreased mobility, difficulty walking, impaired UE functional use, postural dysfunction, obesity, and pain.   ACTIVITY LIMITATIONS: carrying, lifting, bending, standing, squatting, stairs, transfers, bed mobility, bathing, dressing, reach over head, hygiene/grooming, and locomotion level  PARTICIPATION LIMITATIONS: medication management, personal finances, driving, shopping, and community activity  PERSONAL FACTORS: Age, Time since onset of injury/illness/exacerbation, and 2+ comorbidities: Visual impairment, cognitive impairment,  are also affecting patient's functional outcome.   REHAB POTENTIAL: Good  CLINICAL DECISION MAKING: Evolving/moderate complexity  EVALUATION COMPLEXITY: Moderate   GOALS: Goals reviewed with patient? Yes  SHORT TERM GOALS: Target date: 08/18/2023    Patient will be screened for balance deficits and goals to be assessed Baseline: Goal status: INITIAL  2.  Patient will participate in home exercise program 3 days/week with assistance of spouse Baseline:  Goal status: INITIAL  3.  Patient will ambulate 150 feet using walker with close  supervision Baseline:  Goal status: INITIAL    LONG TERM GOALS: Target date: 09/15/2023    Pt will be able to walk 300 feet in a controlled environment with supervision, min cues  Baseline:  Goal status: INITIAL  2.  Patient will be independent with home program with the help of spouse for balance strength Baseline:  Goal status: INITIAL  3.  Patient will be able to complete 200 feet for 2-minute walk test using rollator walker show improved endurance Baseline:  Goal status: INITIAL  4.  Balance goal to be determined based on standardized test (TUG, Berg) Baseline:  Goal status: INITIAL  5.  Patient and wife will report improved alertness and energy levels to allow time for TV, radio.  Baseline:  Goal status: INITIAL  6. FOTO score will improve to 53% or better to demo improved functional mobility   Baseline: 42%   Goal: 53% PLAN:  PT FREQUENCY: 1-2x/week  PT DURATION: 8 weeks  PLANNED INTERVENTIONS: Therapeutic exercises, Therapeutic activity, Neuromuscular re-education, Balance training, Gait training, Patient/Family education, Self Care, Stair training, DME instructions, and Re-evaluation.  PLAN FOR NEXT SESSION:  tug with SPC? develop HEP, gait with walker/rollator,  LEGALLY BLIND , establish HEP   Sherrie Mustache, PTA 07/30/2023, 2:08 PM

## 2023-08-05 NOTE — Therapy (Signed)
OUTPATIENT PHYSICAL THERAPY THORACOLUMBAR TREATMENT   Patient Name: Rodney Christensen MRN: [id number] DOB:1941/02/15, 82 y.o., male Today's Date: 08/06/2023  END OF SESSION:  PT End of Session - 08/06/23 1421     Visit Number 3    Number of Visits 16    Date for PT Re-Evaluation 09/15/23    Authorization Type [employer] MCR    PT Start Time 1418    PT Stop Time 1502    PT Time Calculation (min) 44 min    Activity Tolerance Patient tolerated treatment well    Behavior During Therapy WFL for tasks assessed/performed              Past Medical History:  Diagnosis Date   BPH (benign prostatic hyperplasia)    CAD, NATIVE VESSEL 03/22/2010   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   Diverticulosis    History of radiation therapy 03/18/11 to 04/19/11   lower left lung   HYPERLIPIDEMIA-MIXED 03/17/2009   HYPERTENSION, UNSPECIFIED 03/17/2009   Macular degeneration    Skin cancer    melanoma-nose   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    Past Surgical History:  Procedure Laterality Date   BRONCHOSCOPY  06/11/11   [facility]   CAROTID ENDARTERECTOMY     CORONARY ARTERY BYPASS GRAFT  10/10/2004   x5   Lt VATS,Lt thoacotomy,Lt lower lobectomy with  mediastinal node  dissection  06/11/11   [facility]   Patient Active Problem List   Diagnosis Date Noted   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   Malignant neoplasm of lower lobe, bronchus, or lung 03/27/2012   History of radiation therapy    Skin cancer    BPH (benign prostatic hyperplasia)    Squamous cell carcinoma lung, Left lower lobe    CAD, NATIVE VESSEL 03/22/2010   HYPERLIPIDEMIA-MIXED 03/17/2009   HYPERTENSION, UNSPECIFIED 03/17/2009   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   CAROTID ENDARTERECTOMY, LEFT, HX OF 03/17/2009    PCP: Effie Shy MD   REFERRING PROVIDER: Penelope Galas, MD  REFERRING DIAG: M62.81 (ICD-10-CM) - Muscle weakness (generalized)  Rationale for Evaluation and Treatment:  Rehabilitation  THERAPY DIAG:  Other abnormalities of gait and mobility  Unsteadiness on feet  Muscle weakness (generalized)  Cognitive communication deficit  ONSET DATE: chronic   SUBJECTIVE:                                                                                                                                                                                           SUBJECTIVE STATEMENT: No walker yet.  Pt has no complaints.   Pt's wife present for  PT eval.  Wife states the patient has difficulty with mobility in the home.  He reports he is tired all the time.  He is overall weak and feels over the past few years he has gotten weaker.  Limited by progressing dementia and memory loss.  When he walks she has to watch him, she says he moves quickly and appears unsteady.  He reports dizziness at times if he moves too quickly.  At times he feels weak in his legs, like they aren't responding. He walks with a cane and has been using it for 4 mos.  He uses the cane in the home.  He is open to using a walker and his wife would like to get a rollator vs a standard walker.  Occasional he gets back pain but that is not daily.    PERTINENT HISTORY:  Mixed dementia/vascular with decreased memory Visual deficits HTN, kidney disease  Melanoma, lung cancer    PAIN:  Are you having pain? Yes: NPRS scale: none /10 Pain location: back (low) Pain description: aching  Aggravating factors: unsure  Relieving factors: pain meds   PRECAUTIONS: None and Fall  RED FLAGS: None   WEIGHT BEARING RESTRICTIONS: No  FALLS:  Has patient fallen in last 6 months? No  Pt is unsteady and is aware of his balance deficits.   LIVING ENVIRONMENT: Lives with: lives with their spouse Lives in: House/apartment Stairs: Yes: External: 5 steps; on right going up Has following equipment at home: Single point cane also has a walker (RW)   OCCUPATION: retired   PLOF: Independent with basic ADLs,  Independent with community mobility with device, Needs assistance with ADLs, Needs assistance with homemaking, Vocation/Vocational requirements: Was working in [employer], and Leisure: likes baseball and listening to music   PATIENT GOALS: I would like to be more independent if I can.    NEXT MD VISIT: Unknown  OBJECTIVE:   DIAGNOSTIC FINDINGS:  Brain MR 2022: No acute intracranial abnormality. 2. Progressive, moderate cerebral atrophy. 3. Small chronic left parietal infarct.  PATIENT SURVEYS:  FOTO 42% , goal 53%  SCREENING FOR RED FLAGS: Bowel or bladder incontinence: No Spinal tumors: No Cauda equina syndrome: No Compression fracture: No Abdominal aneurysm: No  COGNITION: Overall cognitive status: History of cognitive impairments - at baseline     SENSATION: WFL  MUSCLE LENGTH:  NT  POSTURE: rounded shoulders, forward head, and increased thoracic kyphosis  PALPATION: NT  LUMBAR ROM: Not full tested AROM eval  Flexion   Extension   Right lateral flexion   Left lateral flexion   Right rotation WFL in standing   Left rotation WFL in standing    (Blank rows = not tested)  LOWER EXTREMITY ROM:   NT on eval   Passive  Right eval Left eval  Hip flexion    Hip extension    Hip abduction    Hip adduction    Hip internal rotation    Hip external rotation    Knee flexion    Knee extension    Ankle dorsiflexion    Ankle plantarflexion    Ankle inversion    Ankle eversion     (Blank rows = not tested)  LOWER EXTREMITY MMT:    MMT Right eval Left eval  Hip flexion 4+ 4-  Hip extension    Hip abduction    Hip adduction    Hip internal rotation    Hip external rotation    Knee flexion 5 5  Knee extension 5 4+  Ankle dorsiflexion 4 5  Ankle plantarflexion    Ankle inversion    Ankle eversion     (Blank rows = not tested)   UE AROM overhead to about 120 deg, increased postural sway UE strength in shoulder flexion and abduction  5/5 Grip  strength Rt 18, Lt 17 lbs    FUNCTIONAL TESTS:  5 times sit to stand: 29 sec  2 minute walk test: 131 feet Berg Balance Scale: NT on eval  Step tap test > 40 sec each LE x 5  07/29/23: 33.6 sec TUG with RW (clinic walker with slides)  07/29/23: 102 feet using clinic RW  GAIT: Distance walked: 131 Assistive device utilized: Single point cane Level of assistance: Min A Comments: Patient walked with a single-point cane 131 feet with minimal assist for steadying.  Patient demonstrates variable step length gait speed. Takes brief pauses then takes larger steps.   TODAY'S TREATMENT:      [facility] Adult PT Treatment:                                                DATE: 08/06/23 Therapeutic Exercise: NuStep L 3 for 5 min  Sit to stand  x 10 Heel raise x 15 Gait 180 feet with walker , up and down stairs 6 with min A and rails x 2  Standing balance on Airex Static, added small slow head turns, nods March and step ups onto Airex min A  Double arms overhead  x 10 on Airex Hip abduction x 10 mod cues                                                                                                                              [facility] Adult PT Treatment:                                                DATE: 07/30/23  Therapeutic Activity: BERG BALANCE TEST Sitting to Standing: 4.      Stands without using hands and stabilize independently Standing Unsupported: 3.      Stands 2 minutes with supervision Sitting Unsupported: 4.     Sits for 2 minutes independently Standing to Sitting: 4.     Sits safely with minimal use of hands Transfers: 3.     Transfers safely definite use of hands Standing with eyes closed: 4.     Stands safely for 10 seconds  Standing with feet together: 3.     Stands for 1 minute with supervision Reaching forward with outstretched arm: 3.     Reaches forward 5 inches Retrieving object from the floor: 3.  Able to pick up with supervision Turning to look behind: 4.     Looks behind  from both sides and weight shifts well Turning 360 degrees: 1.     Needs supervision or verbal cueing Place alternate foot on stool: 0.     Unable, needs assist to keep from falling Standing with one foot in front: 2.     Independent small step for 30 seconds Standing on one foot: 1.     Holds <3 seconds  Total Score: 39/56   TUG with RW with RW- cues for safety and transfers     DATE: 07/31/23    PATIENT EDUCATION:  Education details: PT, plan of care, rolling walker versus rollator, assessment findings, prognosis Person educated: Patient and Spouse Education method: Explanation and Verbal cues Education comprehension: verbalized understanding  HOME EXERCISE PROGRAM: Access Code: [email or web address] URL: [email or web address] Date: 08/06/2023 Prepared by: Karie Mainland  Exercises - Sit to Stand with Counter Support  - 1 x daily - 7 x weekly - 2 sets - 10 reps - 2 hold - Standing Hip Abduction with Counter Support  - 1 x daily - 7 x weekly - 2 sets - 10 reps - 2 hold - Standing Heel Raise with Support  - 1 x daily - 7 x weekly - 2 sets - 10 reps - 2 hold - Standing March with Counter Support  - 1 x daily - 7 x weekly - 2 sets - 10 reps ASSESSMENT:  CLINICAL IMPRESSION: Pt was given HEP for balance and general strength.  He needs redirection for staying focused on task and to complete an activity. He showed good balance reactions with min dynamic exercises.He definitely needs the use of a rolling walker for a more stable and normalized gait pattern as well as visual deficits.    EVAL: Patient is a 82 y.o. male who was seen today for physical therapy evaluation and treatment for gait instability, balance deficits and generalized weakness.   OBJECTIVE IMPAIRMENTS: Abnormal gait, decreased activity tolerance, decreased balance, decreased cognition, decreased coordination, decreased endurance, decreased mobility, difficulty walking, impaired UE functional use, postural  dysfunction, obesity, and pain.   ACTIVITY LIMITATIONS: carrying, lifting, bending, standing, squatting, stairs, transfers, bed mobility, bathing, dressing, reach over head, hygiene/grooming, and locomotion level  PARTICIPATION LIMITATIONS: medication management, personal finances, driving, shopping, and community activity  PERSONAL FACTORS: Age, Time since onset of injury/illness/exacerbation, and 2+ comorbidities: Visual impairment, cognitive impairment,  are also affecting patient's functional outcome.   REHAB POTENTIAL: Good  CLINICAL DECISION MAKING: Evolving/moderate complexity  EVALUATION COMPLEXITY: Moderate   GOALS: Goals reviewed with patient? Yes  SHORT TERM GOALS: Target date: 08/18/2023    Patient will be screened for balance deficits and goals to be assessed Baseline: Goal status: MET  2.  Patient will participate in home exercise program 3 days/week with assistance of spouse Baseline:  Goal status: INITIAL  3.  Patient will ambulate 150 feet using walker with close supervision Baseline:  Goal status: MET     LONG TERM GOALS: Target date: 09/15/2023    Pt will be able to walk 300 feet in a controlled environment with supervision, min cues  Baseline:  Goal status: INITIAL  2.  Patient will be independent with home program with the help of spouse for balance strength Baseline:  Goal status: INITIAL  3.  Patient will be able to complete 200 feet for 2-minute walk test using rollator walker show improved endurance Baseline:  Goal status: INITIAL  4.  Berg Balance test score will improve will to reduce fall risk.  Baseline: 39/56 goal is 45/56 Goal status: ongoing   5.  Patient and wife will report improved alertness and energy levels to allow time for TV, radio.  Baseline:  Goal status: INITIAL  6. FOTO score will improve to 53% or better to demo improved functional mobility   Baseline: 42%   Goal: 53% PLAN:  PT FREQUENCY: 1-2x/week  PT  DURATION: 8 weeks  PLANNED INTERVENTIONS: Therapeutic exercises, Therapeutic activity, Neuromuscular re-education, Balance training, Gait training, Patient/Family education, Self Care, Stair training, DME instructions, and Re-evaluation.  PLAN FOR NEXT SESSION:  tug with SPC? develop HEP, gait with walker/rollator,  LEGALLY BLIND , check HEP with spouse    Kadajah Kjos, PT 08/06/2023, 3:20 PM

## 2023-08-06 ENCOUNTER — Ambulatory Visit: Payer: Medicare Other | Attending: Internal Medicine | Admitting: Physical Therapy

## 2023-08-06 ENCOUNTER — Encounter: Payer: Self-pay | Admitting: Physical Therapy

## 2023-08-06 DIAGNOSIS — R2681 Unsteadiness on feet: Secondary | ICD-10-CM | POA: Insufficient documentation

## 2023-08-06 DIAGNOSIS — M6281 Muscle weakness (generalized): Secondary | ICD-10-CM | POA: Diagnosis present

## 2023-08-06 DIAGNOSIS — R41841 Cognitive communication deficit: Secondary | ICD-10-CM | POA: Insufficient documentation

## 2023-08-06 DIAGNOSIS — R2689 Other abnormalities of gait and mobility: Secondary | ICD-10-CM | POA: Diagnosis present

## 2023-08-08 ENCOUNTER — Ambulatory Visit: Payer: Medicare Other

## 2023-08-08 DIAGNOSIS — R2681 Unsteadiness on feet: Secondary | ICD-10-CM

## 2023-08-08 DIAGNOSIS — R2689 Other abnormalities of gait and mobility: Secondary | ICD-10-CM | POA: Diagnosis not present

## 2023-08-08 DIAGNOSIS — R41841 Cognitive communication deficit: Secondary | ICD-10-CM

## 2023-08-08 DIAGNOSIS — M6281 Muscle weakness (generalized): Secondary | ICD-10-CM

## 2023-08-08 NOTE — Therapy (Signed)
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   Diverticulosis    History of radiation therapy 03/18/11 to 04/19/11   lower left lung   HYPERLIPIDEMIA-MIXED 03/17/2009   HYPERTENSION, UNSPECIFIED 03/17/2009   Macular degeneration    Skin cancer    melanoma-nose   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    Past Surgical History:  Procedure Laterality Date   BRONCHOSCOPY  06/11/11   [facility]   CAROTID ENDARTERECTOMY     CORONARY ARTERY BYPASS GRAFT  10/10/2004   x5   Lt VATS,Lt thoacotomy,Lt lower lobectomy with  mediastinal node  dissection  06/11/11   [facility]   Patient Active Problem List   Diagnosis Date Noted   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   Malignant neoplasm of lower lobe, bronchus, or lung 03/27/2012   History of radiation therapy    Skin cancer    BPH (benign prostatic hyperplasia)    Squamous cell carcinoma lung, Left lower lobe    CAD, NATIVE VESSEL 03/22/2010   HYPERLIPIDEMIA-MIXED 03/17/2009   HYPERTENSION, UNSPECIFIED 03/17/2009   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   CAROTID ENDARTERECTOMY, LEFT, HX OF 03/17/2009    PCP: Effie Shy MD   REFERRING PROVIDER: Penelope Galas, MD  REFERRING DIAG: M62.81 (ICD-10-CM) - Muscle weakness (generalized)  Rationale for Evaluation and Treatment: Rehabilitation  THERAPY DIAG:  Other  abnormalities of gait and mobility  Unsteadiness on feet  Muscle weakness (generalized)  Cognitive communication deficit  ONSET DATE: chronic   SUBJECTIVE:                                                                                                                                                                                           SUBJECTIVE STATEMENT: No walker yet.  Pt reports occasional fatigue with walking and therex.  Pt's wife present for PT eval.  Wife  states the patient has difficulty with mobility in the home.  He reports he is tired all the time.  He is overall weak and feels over the past few years he has gotten weaker.  Limited by progressing dementia and memory loss.  When he walks she has to watch him, she says he moves quickly and appears unsteady.  He reports dizziness at times if he moves too quickly.  At times he feels weak in his legs, like they aren't responding. He walks with a cane and has been using it for 4 mos.  He uses the cane in the home.  He is open to using a walker and his wife would like to get a rollator vs a standard walker.  Occasional he gets back pain but that is not daily.    PERTINENT HISTORY:  Mixed dementia/vascular with decreased memory Visual deficits HTN, kidney disease  Melanoma, lung cancer    PAIN:  Are you having pain? Yes: NPRS scale: none /10 Pain location: back (low) Pain description: aching  Aggravating factors: unsure  Relieving factors: pain meds   PRECAUTIONS: None and Fall  RED FLAGS: None   WEIGHT BEARING RESTRICTIONS: No  FALLS:  Has patient fallen in last 6 months? No  Pt is unsteady and is aware of his balance deficits.   LIVING ENVIRONMENT: Lives with: lives with their spouse Lives in: House/apartment Stairs: Yes: External: 5 steps; on right going up Has following equipment at home: Single point cane also has a walker (RW)   OCCUPATION: retired   PLOF: Independent with basic ADLs, Independent with  community mobility with device, Needs assistance with ADLs, Needs assistance with homemaking, Vocation/Vocational requirements: Was working in [employer], and Leisure: likes baseball and listening to music   PATIENT GOALS: I would like to be more independent if I can.    NEXT MD VISIT: Unknown  OBJECTIVE:   DIAGNOSTIC FINDINGS:  Brain MR 2022: No acute intracranial abnormality. 2. Progressive, moderate cerebral atrophy. 3. Small chronic left parietal infarct.  PATIENT SURVEYS:  FOTO 42% , goal 53%  SCREENING FOR RED FLAGS: Bowel or bladder incontinence: No Spinal tumors: No Cauda equina syndrome: No Compression fracture: No Abdominal aneurysm: No  COGNITION: Overall cognitive status: History of cognitive impairments - at baseline     SENSATION: WFL  MUSCLE LENGTH:  NT  POSTURE: rounded shoulders, forward head, and increased thoracic kyphosis  PALPATION: NT  LUMBAR ROM: Not full tested AROM eval  Flexion   Extension   Right lateral flexion   Left lateral flexion   Right rotation WFL in standing   Left rotation WFL in standing    (Blank rows = not tested)  LOWER EXTREMITY ROM:   NT on eval   Passive  Right eval Left eval  Hip flexion    Hip extension    Hip abduction    Hip adduction    Hip internal rotation    Hip external rotation    Knee flexion    Knee extension    Ankle dorsiflexion    Ankle plantarflexion    Ankle inversion    Ankle eversion     (Blank rows = not tested)  LOWER EXTREMITY MMT:    MMT Right eval Left eval  Hip flexion 4+ 4-  Hip extension    Hip abduction    Hip adduction    Hip internal rotation    Hip external rotation    Knee flexion 5 5  Knee extension 5 4+  Ankle dorsiflexion 4 5  Ankle plantarflexion    Ankle inversion    Ankle eversion     (Blank rows = not tested)   UE AROM overhead to about 120 deg, increased postural sway UE strength in shoulder flexion and abduction  5/5 Grip strength Rt 18, Lt 17  lbs    FUNCTIONAL TESTS:  5 times sit to stand: 29 sec  2 minute walk test: 131 feet Berg Balance Scale: NT on eval  Step tap test > 40 sec each LE x 5  07/29/23: 33.6 sec TUG with RW (clinic walker with slides)  07/29/23: 102 feet using clinic RW  GAIT: Distance walked: 131 Assistive device utilized: Single point cane Level of assistance: Min A Comments: Patient walked with a single-point cane 131 feet with minimal assist for steadying.  Patient demonstrates variable step length gait speed. Takes brief pauses then takes larger steps.   TODAY'S TREATMENT:     [facility] Adult PT Treatment:                                                DATE: 08/08/23 Therapeutic Exercise: NuStep L 3 for 5 min  Sit to stand  x 10 c pt hand assist and SBA Heel raise x 15 c pt hand assist and SBA Marching c pt hand A and SBA Double arms overhead  x 10  CGA Standing Hip abduction x 10 c pt hand A abd SBA Therapeutic Activities: Gait 180 feet c SPC and HHA. 180 c RW and Sup c verbal cueing to provide direction due to vision deficits Narrow base standing, added small slow head turns, nods c CGA  [facility] Adult PT Treatment:                                                DATE: 08/06/23 Therapeutic Exercise: NuStep L 3 for 5 min  Sit to stand  x 10  Heel raise x 10 Gait 180 feet with walker , up and down stairs 6 with min A and rails x 2  Standing balance on Airex Static, added small slow head turns, nods March and step ups onto Airex min A  Double arms overhead  x 10 on Airex Hip abduction x 10 mod cues                                                                                                                              [facility] Adult PT Treatment:  DATE: 07/30/23  Therapeutic Activity: BERG BALANCE TEST Sitting to Standing: 4.      Stands without using hands and stabilize independently Standing Unsupported: 3.      Stands 2 minutes with supervision Sitting  Unsupported: 4.     Sits for 2 minutes independently Standing to Sitting: 4.     Sits safely with minimal use of hands Transfers: 3.     Transfers safely definite use of hands Standing with eyes closed: 4.     Stands safely for 10 seconds  Standing with feet together: 3.     Stands for 1 minute with supervision Reaching forward with outstretched arm: 3.     Reaches forward 5 inches Retrieving object from the floor: 3.     Able to pick up with supervision Turning to look behind: 4.     Looks behind from both sides and weight shifts well Turning 360 degrees: 1.     Needs supervision or verbal cueing Place alternate foot on stool: 0.     Unable, needs assist to keep from falling Standing with one foot in front: 2.     Independent small step for 30 seconds Standing on one foot: 1.     Holds <3 seconds  Total Score: 39/56   TUG with RW with RW- cues for safety and transfers     DATE: 07/31/23    PATIENT EDUCATION:  Education details: PT, plan of care, rolling walker versus rollator, assessment findings, prognosis Person educated: Patient and Spouse Education method: Explanation and Verbal cues Education comprehension: verbalized understanding  HOME EXERCISE PROGRAM: Access Code: [email or web address] URL: [email or web address] Date: 08/06/2023 Prepared by: Karie Mainland  Exercises - Sit to Stand with Counter Support  - 1 x daily - 7 x weekly - 2 sets - 10 reps - 2 hold - Standing Hip Abduction with Counter Support  - 1 x daily - 7 x weekly - 2 sets - 10 reps - 2 hold - Standing Heel Raise with Support  - 1 x daily - 7 x weekly - 2 sets - 10 reps - 2 hold - Standing March with Counter Support  - 1 x daily - 7 x weekly - 2 sets - 10 reps ASSESSMENT:  CLINICAL IMPRESSION: PT was completed therex for strengthening, balance, and activity tolerance. Rest was provided as needed with pt reporting fatigue occasionally during session. No shortness of breath was observed. Redirection  was provided for pt to stay on task. Pt's balance is improved c RW vs cane. Improvement in balance and function beyond the added assistance from a RW seems limited. Pt tolerated PT today without adverse effects.  EVAL: Patient is a 82 y.o. male who was seen today for physical therapy evaluation and treatment for gait instability, balance deficits and generalized weakness.   OBJECTIVE IMPAIRMENTS: Abnormal gait, decreased activity tolerance, decreased balance, decreased cognition, decreased coordination, decreased endurance, decreased mobility, difficulty walking, impaired UE functional use, postural dysfunction, obesity, and pain.   ACTIVITY LIMITATIONS: carrying, lifting, bending, standing, squatting, stairs, transfers, bed mobility, bathing, dressing, reach over head, hygiene/grooming, and locomotion level  PARTICIPATION LIMITATIONS: medication management, personal finances, driving, shopping, and community activity  PERSONAL FACTORS: Age, Time since onset of injury/illness/exacerbation, and 2+ comorbidities: Visual impairment, cognitive impairment,  are also affecting patient's functional outcome.   REHAB POTENTIAL: Good  CLINICAL DECISION MAKING: Evolving/moderate complexity  EVALUATION COMPLEXITY: Moderate   GOALS: Goals reviewed with patient? Yes  SHORT TERM GOALS: Target date: 08/18/2023  Patient will be screened for balance deficits and goals to be assessed Baseline: Goal status: MET  2.  Patient will participate in home exercise program 3 days/week with assistance of spouse Baseline:  Goal status: INITIAL  3.  Patient will ambulate 150 feet using walker with close supervision Baseline:  Goal status: MET     LONG TERM GOALS: Target date: 09/15/2023    Pt will be able to walk 300 feet in a controlled environment with supervision, min cues  Baseline:  Goal status: INITIAL  2.  Patient will be independent with home program with the help of spouse for balance  strength Baseline:  Goal status: INITIAL  3.  Patient will be able to complete 200 feet for 2-minute walk test using rollator walker show improved endurance Baseline:  Goal status: INITIAL  4.  Berg Balance test score will improve will to reduce fall risk.  Baseline: 39/56 goal is 45/56 Goal status: ongoing   5.  Patient and wife will report improved alertness and energy levels to allow time for TV, radio.  Baseline:  Goal status: INITIAL  6. FOTO score will improve to 53% or better to demo improved functional mobility   Baseline: 42%   Goal: 53% PLAN:  PT FREQUENCY: 1-2x/week  PT DURATION: 8 weeks  PLANNED INTERVENTIONS: Therapeutic exercises, Therapeutic activity, Neuromuscular re-education, Balance training, Gait training, Patient/Family education, Self Care, Stair training, DME instructions, and Re-evaluation.  PLAN FOR NEXT SESSION:  tug with SPC? develop HEP, gait with walker/rollator,  LEGALLY BLIND , check HEP with spouse    Joellyn Rued MS, PT 08/08/23 12:20 PM

## 2023-08-12 NOTE — Therapy (Signed)
OUTPATIENT PHYSICAL THERAPY THORACOLUMBAR TREATMENT   Patient Name: Rodney Christensen MRN: [id number] DOB:[birth year], 82 y.o., male Today's Date: 08/13/2023  END OF SESSION:  PT End of Session - 08/13/23 1343     Visit Number 5    Number of Visits 16    Date for PT Re-Evaluation 09/15/23    Authorization Type [employer] MCR    PT Start Time 1333    PT Stop Time 1415    PT Time Calculation (min) 42 min    Activity Tolerance Patient tolerated treatment well    Behavior During Therapy WFL for tasks assessed/performed                Past Medical History:  Diagnosis Date   BPH (benign prostatic hyperplasia)    CAD, NATIVE VESSEL 03/22/2010   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   Diverticulosis    History of radiation therapy 03/18/11 to 04/19/11   lower left lung   HYPERLIPIDEMIA-MIXED 03/17/2009   HYPERTENSION, UNSPECIFIED 03/17/2009   Macular degeneration    Skin cancer    melanoma-nose   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    Past Surgical History:  Procedure Laterality Date   BRONCHOSCOPY  06/11/11   [facility]   CAROTID ENDARTERECTOMY     CORONARY ARTERY BYPASS GRAFT  10/10/2004   x5   Lt VATS,Lt thoacotomy,Lt lower lobectomy with  mediastinal node  dissection  06/11/11   [facility]   Patient Active Problem List   Diagnosis Date Noted   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   Malignant neoplasm of lower lobe, bronchus, or lung 03/27/2012   History of radiation therapy    Skin cancer    BPH (benign prostatic hyperplasia)    Squamous cell carcinoma lung, Left lower lobe    CAD, NATIVE VESSEL 03/22/2010   HYPERLIPIDEMIA-MIXED 03/17/2009   HYPERTENSION, UNSPECIFIED 03/17/2009   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   CAROTID ENDARTERECTOMY, LEFT, HX OF 03/17/2009    PCP: Effie Shy MD   REFERRING PROVIDER: Penelope Galas, MD  REFERRING DIAG: M62.81 (ICD-10-CM) - Muscle weakness (generalized)  Rationale for Evaluation and  Treatment: Rehabilitation  THERAPY DIAG:  Other abnormalities of gait and mobility  Unsteadiness on feet  Muscle weakness (generalized)  ONSET DATE: chronic   SUBJECTIVE:                                                                                                                                                                                           SUBJECTIVE STATEMENT: Pt reports he is doing well today. RW has been ordered. He notes he has not  been completing his HEP.  Pt's wife present for PT eval.  Wife states the patient has difficulty with mobility in the home.  He reports he is tired all the time.  He is overall weak and feels over the past few years he has gotten weaker.  Limited by progressing dementia and memory loss.  When he walks she has to watch him, she says he moves quickly and appears unsteady.  He reports dizziness at times if he moves too quickly.  At times he feels weak in his legs, like they aren't responding. He walks with a cane and has been using it for 4 mos.  He uses the cane in the home.  He is open to using a walker and his wife would like to get a rollator vs a standard walker.  Occasional he gets back pain but that is not daily.    PERTINENT HISTORY:  Mixed dementia/vascular with decreased memory Visual deficits HTN, kidney disease  Melanoma, lung cancer    PAIN:  Are you having pain? Yes: NPRS scale: none /10 Pain location: back (low) Pain description: aching  Aggravating factors: unsure  Relieving factors: pain meds   PRECAUTIONS: None and Fall  RED FLAGS: None   WEIGHT BEARING RESTRICTIONS: No  FALLS:  Has patient fallen in last 6 months? No  Pt is unsteady and is aware of his balance deficits.   LIVING ENVIRONMENT: Lives with: lives with their spouse Lives in: House/apartment Stairs: Yes: External: 5 steps; on right going up Has following equipment at home: Single point cane also has a walker (RW)   OCCUPATION: retired   PLOF:  Independent with basic ADLs, Independent with community mobility with device, Needs assistance with ADLs, Needs assistance with homemaking, Vocation/Vocational requirements: Was working in [employer], and Leisure: likes baseball and listening to music   PATIENT GOALS: I would like to be more independent if I can.    NEXT MD VISIT: Unknown  OBJECTIVE:   DIAGNOSTIC FINDINGS:  Brain MR 2022: No acute intracranial abnormality. 2. Progressive, moderate cerebral atrophy. 3. Small chronic left parietal infarct.  PATIENT SURVEYS:  FOTO 42% , goal 53%  SCREENING FOR RED FLAGS: Bowel or bladder incontinence: No Spinal tumors: No Cauda equina syndrome: No Compression fracture: No Abdominal aneurysm: No  COGNITION: Overall cognitive status: History of cognitive impairments - at baseline     SENSATION: WFL  MUSCLE LENGTH:  NT  POSTURE: rounded shoulders, forward head, and increased thoracic kyphosis  PALPATION: NT  LUMBAR ROM: Not full tested AROM eval  Flexion   Extension   Right lateral flexion   Left lateral flexion   Right rotation WFL in standing   Left rotation WFL in standing    (Blank rows = not tested)  LOWER EXTREMITY ROM:   NT on eval   Passive  Right eval Left eval  Hip flexion    Hip extension    Hip abduction    Hip adduction    Hip internal rotation    Hip external rotation    Knee flexion    Knee extension    Ankle dorsiflexion    Ankle plantarflexion    Ankle inversion    Ankle eversion     (Blank rows = not tested)  LOWER EXTREMITY MMT:    MMT Right eval Left eval  Hip flexion 4+ 4-  Hip extension    Hip abduction    Hip adduction    Hip internal rotation    Hip external  rotation    Knee flexion 5 5  Knee extension 5 4+  Ankle dorsiflexion 4 5  Ankle plantarflexion    Ankle inversion    Ankle eversion     (Blank rows = not tested)   UE AROM overhead to about 120 deg, increased postural sway UE strength in shoulder flexion  and abduction  5/5 Grip strength Rt 18, Lt 17 lbs    FUNCTIONAL TESTS:  5 times sit to stand: 29 sec  2 minute walk test: 131 feet Berg Balance Scale: NT on eval  Step tap test > 40 sec each LE x 5  07/29/23: 33.6 sec TUG with RW (clinic walker with slides)  07/29/23: 102 feet using clinic RW  GAIT: Distance walked: 131 Assistive device utilized: Single point cane Level of assistance: Min A Comments: Patient walked with a single-point cane 131 feet with minimal assist for steadying.  Patient demonstrates variable step length gait speed. Takes brief pauses then takes larger steps.   TODAY'S TREATMENT:  [facility] Adult PT Treatment:                                                DATE: 08/13/23 Therapeutic Exercise: NuStep L3 for 6 min  Sit to stand  x 10 c pt hand assist and SBA Marching 2x20 c pt hand A and SBA Double arms overhead  x 10, c 3# ball x10 SGA Standing Hip abduction x 10 c pt hand A abd SBA Therapeutic Activities: Gait 180 feet c SPC and SBA verbal cueing due to visual deficits  Narrow base standing, added small slow head turns and nods c SBA     [facility] Adult PT Treatment:                                                DATE: 08/08/23 Therapeutic Exercise: NuStep L 3 for 5 min  Sit to stand  x 10 c pt hand assist and SBA Heel raise x 15 c pt hand assist and SBA Marching c pt hand A and SBA Double arms overhead  x 10  CGA Standing Hip abduction x 10 c pt hand A abd SBA Therapeutic Activities: Gait 180 feet c SPC and HHA. 180 c RW and Sup c verbal cueing to provide direction due to vision deficits Narrow base standing, added small slow head turns, nods c CGA  Therapeutic Activity: BERG BALANCE TEST Sitting to Standing: 4.      Stands without using hands and stabilize independently Standing Unsupported: 3.      Stands 2 minutes with supervision Sitting Unsupported: 4.     Sits for 2 minutes independently Standing to Sitting: 4.     Sits safely with minimal use of  hands Transfers: 3.     Transfers safely definite use of hands Standing with eyes closed: 4.     Stands safely for 10 seconds  Standing with feet together: 3.     Stands for 1 minute with supervision Reaching forward with outstretched arm: 3.     Reaches forward 5 inches Retrieving object from the floor: 3.     Able to pick up with supervision Turning to look behind: 4.     Looks behind  from both sides and weight shifts well Turning 360 degrees: 1.     Needs supervision or verbal cueing Place alternate foot on stool: 0.     Unable, needs assist to keep from falling Standing with one foot in front: 2.     Independent small step for 30 seconds Standing on one foot: 1.     Holds <3 seconds  Total Score: 39/56   TUG with RW with RW- cues for safety and transfers     DATE: 07/31/23    PATIENT EDUCATION:  Education details: PT, plan of care, rolling walker versus rollator, assessment findings, prognosis Person educated: Patient and Spouse Education method: Explanation and Verbal cues Education comprehension: verbalized understanding  HOME EXERCISE PROGRAM: Access Code: [email or web address] URL: [email or web address] Date: 08/06/2023 Prepared by: Karie Mainland  Exercises - Sit to Stand with Counter Support  - 1 x daily - 7 x weekly - 2 sets - 10 reps - 2 hold - Standing Hip Abduction with Counter Support  - 1 x daily - 7 x weekly - 2 sets - 10 reps - 2 hold - Standing Heel Raise with Support  - 1 x daily - 7 x weekly - 2 sets - 10 reps - 2 hold - Standing March with Counter Support  - 1 x daily - 7 x weekly - 2 sets - 10 reps ASSESSMENT:  CLINICAL IMPRESSION: PT was completed therex for strengthening, balance, and activity tolerance. Pt reported no incidences of being short of breath today. Continue to provided redirection to stay on task with exercise. Pt needs SBA and verbal cueing for safety for ambulation with a SPC. The RW has been ordered and awaiting for delivery. Will  complete gait training with RW when received. Pt tolerated PT today without adverse effects.  Pt will continue to benefit from skilled PT to address impairments for improved functional mobility.   EVAL: Patient is a 82 y.o. male who was seen today for physical therapy evaluation and treatment for gait instability, balance deficits and generalized weakness.   OBJECTIVE IMPAIRMENTS: Abnormal gait, decreased activity tolerance, decreased balance, decreased cognition, decreased coordination, decreased endurance, decreased mobility, difficulty walking, impaired UE functional use, postural dysfunction, obesity, and pain.   ACTIVITY LIMITATIONS: carrying, lifting, bending, standing, squatting, stairs, transfers, bed mobility, bathing, dressing, reach over head, hygiene/grooming, and locomotion level  PARTICIPATION LIMITATIONS: medication management, personal finances, driving, shopping, and community activity  PERSONAL FACTORS: Age, Time since onset of injury/illness/exacerbation, and 2+ comorbidities: Visual impairment, cognitive impairment,  are also affecting patient's functional outcome.   REHAB POTENTIAL: Good  CLINICAL DECISION MAKING: Evolving/moderate complexity  EVALUATION COMPLEXITY: Moderate   GOALS: Goals reviewed with patient? Yes  SHORT TERM GOALS: Target date: 08/18/2023    Patient will be screened for balance deficits and goals to be assessed Baseline: Goal status: MET  2.  Patient will participate in home exercise program 3 days/week with assistance of spouse Baseline:  Goal status: Ongoing  3.  Patient will ambulate 150 feet using walker with close supervision Baseline:  Goal status: MET     LONG TERM GOALS: Target date: 09/15/2023    Pt will be able to walk 300 feet in a controlled environment with supervision, min cues  Baseline:  Goal status: INITIAL  2.  Patient will be independent with home program with the help of spouse for balance strength Baseline:   Goal status: INITIAL  3.  Patient will be able to complete 200 feet for 2-minute walk test using  rollator walker show improved endurance Baseline:  Goal status: INITIAL  4.  Berg Balance test score will improve will to reduce fall risk.  Baseline: 39/56 goal is 45/56 Goal status: ongoing   5.  Patient and wife will report improved alertness and energy levels to allow time for TV, radio.  Baseline:  Goal status: INITIAL  6. FOTO score will improve to 53% or better to demo improved functional mobility   Baseline: 42%   Goal: 53% PLAN:  PT FREQUENCY: 1-2x/week  PT DURATION: 8 weeks  PLANNED INTERVENTIONS: Therapeutic exercises, Therapeutic activity, Neuromuscular re-education, Balance training, Gait training, Patient/Family education, Self Care, Stair training, DME instructions, and Re-evaluation.  PLAN FOR NEXT SESSION:  tug with SPC? develop HEP, gait with walker/rollator,  LEGALLY BLIND , check HEP with spouse    Joellyn Rued MS, PT 08/13/23 2:23 PM  Date of referral: 07/17/23 Referring provider: Penelope Galas, MD  Referring diagnosis? M62.81 (ICD-10-CM) - Muscle weakness (generalized)  Treatment diagnosis? (if different than referring diagnosis)  Other abnormalities of gait and mobility  Unsteadiness on feet  Muscle weakness (generalized)  What was this (referring dx) caused by? Ongoing Issue  [facility] of Condition: Chronic (continuous duration > 3 months)   Laterality: Both  Current Functional Measure Score: FOTO 42%  Objective measurements identify impairments when they are compared to normal values, the uninvolved extremity, and prior level of function.  [x]  Yes  []  No  Objective assessment of functional ability: Severe functional limitations   Briefly describe symptoms: Weakness of legs, decreased balance and decreased functional mobility  How did symptoms start: Chronic  Average pain intensity:  Last 24 hours: 0  Past week: 0  How often does the pt  experience symptoms? Constantly  How much have the symptoms interfered with usual daily activities? Extremely  How has condition changed since care began at this facility? No change  In general, how is the patients overall health? Fair

## 2023-08-13 ENCOUNTER — Ambulatory Visit: Payer: Medicare Other

## 2023-08-13 DIAGNOSIS — M6281 Muscle weakness (generalized): Secondary | ICD-10-CM

## 2023-08-13 DIAGNOSIS — R2689 Other abnormalities of gait and mobility: Secondary | ICD-10-CM

## 2023-08-13 DIAGNOSIS — R2681 Unsteadiness on feet: Secondary | ICD-10-CM

## 2023-08-20 ENCOUNTER — Ambulatory Visit: Payer: Medicare Other | Admitting: Physical Therapy

## 2023-08-20 ENCOUNTER — Encounter: Payer: Self-pay | Admitting: Physical Therapy

## 2023-08-20 DIAGNOSIS — R2689 Other abnormalities of gait and mobility: Secondary | ICD-10-CM

## 2023-08-20 DIAGNOSIS — R2681 Unsteadiness on feet: Secondary | ICD-10-CM

## 2023-08-20 NOTE — Therapy (Addendum)
 OUTPATIENT PHYSICAL THERAPY THORACOLUMBAR TREATMENT   Patient Name: Rodney Christensen MRN: [id number] DOB:06-14-1941, 82 y.o., male Today's Date: 08/20/2023    PHYSICAL THERAPY DISCHARGE SUMMARY  Visits from Start of Care: 6  Current functional level related to goals / functional outcomes: Unknown   Remaining deficits: Unknown    Education / Equipment:HEP    Patient agrees to discharge. Patient goals were partially met. Patient is being discharged due to not returning since the last visit.     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   Diverticulosis    History of radiation therapy 03/18/11 to 04/19/11   lower left lung   HYPERLIPIDEMIA-MIXED 03/17/2009   HYPERTENSION, UNSPECIFIED 03/17/2009   Macular degeneration    Skin cancer    melanoma-nose   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    Past Surgical History:  Procedure Laterality Date   BRONCHOSCOPY  06/11/11   [facility]   CAROTID ENDARTERECTOMY     CORONARY ARTERY BYPASS GRAFT  10/10/2004   x5   Lt VATS,Lt thoacotomy,Lt lower lobectomy with  mediastinal node  dissection  06/11/11   [facility]   Patient Active Problem List   Diagnosis Date Noted   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   Malignant neoplasm of lower lobe, bronchus, or lung 03/27/2012   History of radiation therapy    Skin cancer    BPH (benign prostatic hyperplasia)    Squamous cell carcinoma lung, Left lower lobe    CAD, NATIVE VESSEL 03/22/2010   HYPERLIPIDEMIA-MIXED 03/17/2009   HYPERTENSION, UNSPECIFIED 03/17/2009   CAROTID ARTERY DISEASE  03/17/2009   CAROTID ENDARTERECTOMY, LEFT, HX OF 03/17/2009    PCP: Effie Shy MD   REFERRING PROVIDER: Penelope Galas, MD  REFERRING DIAG: M62.81 (ICD-10-CM) - Muscle weakness (generalized)  Rationale for Evaluation and Treatment: Rehabilitation  THERAPY DIAG:  Other abnormalities of gait and mobility  Unsteadiness on feet  ONSET DATE: chronic   SUBJECTIVE:  SUBJECTIVE STATEMENT: Still waiting on RW. Non compliant with HEP.   Pt's wife present for PT eval.  Wife states the patient has difficulty with mobility in the home.  He reports he is tired all the time.  He is overall weak and feels over the past few years he has gotten weaker.  Limited by progressing dementia and memory loss.  When he walks she has to watch him, she says he moves quickly and appears unsteady.  He reports dizziness at times if he moves too quickly.  At times he feels weak in his legs, like they aren't responding. He walks with a cane and has been using it for 4 mos.  He uses the cane in the home.  He is open to using a walker and his wife would like to get a rollator vs a standard walker.  Occasional he gets back pain but that is not daily.    PERTINENT HISTORY:  Mixed dementia/vascular with decreased memory Visual deficits HTN, kidney disease  Melanoma, lung cancer    PAIN:  Are you having pain? Yes: NPRS scale: none /10 Pain location: back (low) Pain description: aching  Aggravating factors: unsure  Relieving factors: pain meds   PRECAUTIONS: None and Fall  RED FLAGS: None   WEIGHT BEARING RESTRICTIONS: No  FALLS:  Has patient fallen in last 6 months? No  Pt is unsteady and is aware of his balance deficits.   LIVING ENVIRONMENT: Lives with: lives with their spouse Lives in: House/apartment Stairs: Yes:  External: 5 steps; on right going up Has following equipment at home: Single point cane also has a walker (RW)   OCCUPATION: retired   PLOF: Independent with basic ADLs, Independent with community mobility with device, Needs assistance with ADLs, Needs assistance with homemaking, Vocation/Vocational requirements: Was working in [employer], and Leisure: likes baseball and listening to music   PATIENT GOALS: I would like to be more independent if I can.    NEXT MD VISIT: Unknown  OBJECTIVE:   DIAGNOSTIC FINDINGS:  Brain MR 2022: No acute intracranial abnormality. 2. Progressive, moderate cerebral atrophy. 3. Small chronic left parietal infarct.  PATIENT SURVEYS:  FOTO 42% , goal 53%  SCREENING FOR RED FLAGS: Bowel or bladder incontinence: No Spinal tumors: No Cauda equina syndrome: No Compression fracture: No Abdominal aneurysm: No  COGNITION: Overall cognitive status: History of cognitive impairments - at baseline     SENSATION: WFL  MUSCLE LENGTH:  NT  POSTURE: rounded shoulders, forward head, and increased thoracic kyphosis  PALPATION: NT  LUMBAR ROM: Not full tested AROM eval  Flexion   Extension   Right lateral flexion   Left lateral flexion   Right rotation WFL in standing   Left rotation WFL in standing    (Blank rows = not tested)  LOWER EXTREMITY ROM:   NT on eval   Passive  Right eval Left eval  Hip flexion    Hip extension    Hip abduction    Hip adduction    Hip internal rotation    Hip external rotation    Knee flexion    Knee extension    Ankle dorsiflexion    Ankle plantarflexion    Ankle inversion    Ankle eversion     (Blank rows = not tested)  LOWER EXTREMITY MMT:    MMT Right eval Left eval  Hip flexion 4+ 4-  Hip extension    Hip abduction    Hip adduction    Hip  internal rotation    Hip external rotation    Knee flexion 5 5  Knee extension 5 4+  Ankle dorsiflexion 4 5  Ankle plantarflexion    Ankle inversion     Ankle eversion     (Blank rows = not tested)   UE AROM overhead to about 120 deg, increased postural sway UE strength in shoulder flexion and abduction  5/5 Grip strength Rt 18, Lt 17 lbs    FUNCTIONAL TESTS:  5 times sit to stand: 29 sec  2 minute walk test: 131 feet Berg Balance Scale: NT on eval  Step tap test > 40 sec each LE x 5  07/29/23: 33.6 sec TUG with RW (clinic walker with slides)  07/29/23: 102 feet using clinic RW  GAIT: Distance walked: 131 Assistive device utilized: Single point cane Level of assistance: Min A Comments: Patient walked with a single-point cane 131 feet with minimal assist for steadying.  Patient demonstrates variable step length gait speed. Takes brief pauses then takes larger steps.   TODAY'S TREATMENT:  [facility] Adult PT Treatment:                                                DATE: 08/20/23 Therapeutic Exercise: Nustep L5 x 5 minutes  Hip abduction x 10 each , GTB at knees x 10 each  Side stepping at counter without UE, 4 passes Retro stepping at counter- 1 UE , 1 pass Narrow stance with head nod and turns  Tandem stance 20 sec best each foot STS 2 x 10 without UE 6 inch step taps alternating with 1 UE x 10, progressed to No UE x 4 taps, 8 taps      [facility] Adult PT Treatment:                                                DATE: 08/13/23 Therapeutic Exercise: NuStep L3 for 6 min  Sit to stand  x 10 c pt hand assist and SBA Marching 2x20 c pt hand A and SBA Double arms overhead  x 10, c 3# ball x10 SGA Standing Hip abduction x 10 c pt hand A abd SBA Therapeutic Activities: Gait 180 feet c SPC and SBA verbal cueing due to visual deficits  Narrow base standing, added small slow head turns and nods c SBA     [facility] Adult PT Treatment:                                                DATE: 08/08/23 Therapeutic Exercise: NuStep L 3 for 5 min  Sit to stand  x 10 c pt hand assist and SBA Heel raise x 15 c pt hand assist and SBA Marching c pt hand A  and SBA Double arms overhead  x 10  CGA Standing Hip abduction x 10 c pt hand A abd SBA Therapeutic Activities: Gait 180 feet c SPC and HHA. 180 c RW and Sup c verbal cueing to provide direction due to vision deficits Narrow base standing, added small slow head turns, nods c CGA  Therapeutic  Activity: BERG BALANCE TEST Sitting to Standing: 4.      Stands without using hands and stabilize independently Standing Unsupported: 3.      Stands 2 minutes with supervision Sitting Unsupported: 4.     Sits for 2 minutes independently Standing to Sitting: 4.     Sits safely with minimal use of hands Transfers: 3.     Transfers safely definite use of hands Standing with eyes closed: 4.     Stands safely for 10 seconds  Standing with feet together: 3.     Stands for 1 minute with supervision Reaching forward with outstretched arm: 3.     Reaches forward 5 inches Retrieving object from the floor: 3.     Able to pick up with supervision Turning to look behind: 4.     Looks behind from both sides and weight shifts well Turning 360 degrees: 1.     Needs supervision or verbal cueing Place alternate foot on stool: 0.     Unable, needs assist to keep from falling Standing with one foot in front: 2.     Independent small step for 30 seconds Standing on one foot: 1.     Holds <3 seconds  Total Score: 39/56   TUG with RW with RW- cues for safety and transfers     DATE: 07/31/23    PATIENT EDUCATION:  Education details: PT, plan of care, rolling walker versus rollator, assessment findings, prognosis Person educated: Patient and Spouse Education method: Explanation and Verbal cues Education comprehension: verbalized understanding  HOME EXERCISE PROGRAM: Access Code: [email or web address] URL: [email or web address] Date: 08/06/2023 Prepared by: Karie Mainland  Exercises - Sit to Stand with Counter Support  - 1 x daily - 7 x weekly - 2 sets - 10 reps - 2 hold - Standing Hip Abduction with  Counter Support  - 1 x daily - 7 x weekly - 2 sets - 10 reps - 2 hold - Standing Heel Raise with Support  - 1 x daily - 7 x weekly - 2 sets - 10 reps - 2 hold - Standing March with Counter Support  - 1 x daily - 7 x weekly - 2 sets - 10 reps ASSESSMENT:  CLINICAL IMPRESSION: PT was completed therex for strengthening, balance, and activity tolerance. Still waiting on RW. Pt demonstrates improved gait with SPC although he switches sides frequently. Still requires direction due to vision and would benefit from RW support. Progressed standing hip strengthening and balance. He was able to complete alternating step taps without UE support and CGA.   Will complete gait training with RW when received. Pt tolerated PT today without adverse effects.  Pt will continue to benefit from skilled PT to address impairments for improved functional mobility.   EVAL: Patient is a 82 y.o. male who was seen today for physical therapy evaluation and treatment for gait instability, balance deficits and generalized weakness.   OBJECTIVE IMPAIRMENTS: Abnormal gait, decreased activity tolerance, decreased balance, decreased cognition, decreased coordination, decreased endurance, decreased mobility, difficulty walking, impaired UE functional use, postural dysfunction, obesity, and pain.   ACTIVITY LIMITATIONS: carrying, lifting, bending, standing, squatting, stairs, transfers, bed mobility, bathing, dressing, reach over head, hygiene/grooming, and locomotion level  PARTICIPATION LIMITATIONS: medication management, personal finances, driving, shopping, and community activity  PERSONAL FACTORS: Age, Time since onset of injury/illness/exacerbation, and 2+ comorbidities: Visual impairment, cognitive impairment,  are also affecting patient's functional outcome.   REHAB POTENTIAL: Good  CLINICAL DECISION MAKING: Evolving/moderate complexity  EVALUATION COMPLEXITY:  Moderate   GOALS: Goals reviewed with patient? Yes  SHORT  TERM GOALS: Target date: 08/18/2023    Patient will be screened for balance deficits and goals to be assessed Baseline: Goal status: MET  2.  Patient will participate in home exercise program 3 days/week with assistance of spouse Baseline:  08/20/23: non compliant  Goal status: Ongoing  3.  Patient will ambulate 150 feet using walker with close supervision Baseline:  Goal status: MET     LONG TERM GOALS: Target date: 09/15/2023    Pt will be able to walk 300 feet in a controlled environment with supervision, min cues  Baseline:  Goal status: INITIAL  2.  Patient will be independent with home program with the help of spouse for balance strength Baseline:  Goal status: INITIAL  3.  Patient will be able to complete 200 feet for 2-minute walk test using rollator walker show improved endurance Baseline:  Goal status: INITIAL  4.  Berg Balance test score will improve will to reduce fall risk.  Baseline: 39/56 goal is 45/56 Goal status: ongoing   5.  Patient and wife will report improved alertness and energy levels to allow time for TV, radio.  Baseline:  Goal status: INITIAL  6. FOTO score will improve to 53% or better to demo improved functional mobility   Baseline: 42%   Goal: 53% PLAN:  PT FREQUENCY: 1-2x/week  PT DURATION: 8 weeks  PLANNED INTERVENTIONS: Therapeutic exercises, Therapeutic activity, Neuromuscular re-education, Balance training, Gait training, Patient/Family education, Self Care, Stair training, DME instructions, and Re-evaluation.  PLAN FOR NEXT SESSION:  tug with SPC? develop HEP, gait with walker/rollator,  LEGALLY BLIND , check HEP with spouse    Joellyn Rued MS, PT 08/20/23 2:49 PM  Date of referral: 07/17/23 Referring provider: Penelope Galas, MD  Referring diagnosis? M62.81 (ICD-10-CM) - Muscle weakness (generalized)  Treatment diagnosis? (if different than referring diagnosis)  Other abnormalities of gait and mobility  Unsteadiness on  feet  Muscle weakness (generalized)  What was this (referring dx) caused by? Ongoing Issue  Ashby Dawes of Condition: Chronic (continuous duration > 3 months)   Laterality: Both  Current Functional Measure Score: FOTO 42%  Objective measurements identify impairments when they are compared to normal values, the uninvolved extremity, and prior level of function.  [x]  Yes  []  No  Objective assessment of functional ability: Severe functional limitations   Briefly describe symptoms: Weakness of legs, decreased balance and decreased functional mobility  How did symptoms start: Chronic  Average pain intensity:  Last 24 hours: 0  Past week: 0  How often does the pt experience symptoms? Constantly  How much have the symptoms interfered with usual daily activities? Extremely  How has condition changed since care began at this facility? No change  In general, how is the patients overall health? Fair

## 2023-09-03 ENCOUNTER — Ambulatory Visit: Payer: Medicare Other

## 2023-09-03 ENCOUNTER — Telehealth: Payer: Self-pay

## 2023-09-03 NOTE — Telephone Encounter (Signed)
Spoke with pt's brother in law via phone. He reports the pt's wife is in the hospital and they will call to reschedule when she is dischanged.

## 2023-09-18 ENCOUNTER — Encounter: Payer: Self-pay | Admitting: Physician Assistant

## 2023-09-18 ENCOUNTER — Ambulatory Visit: Payer: Medicare Other | Admitting: Physician Assistant

## 2023-09-18 VITALS — BP 101/61 | HR 59 | Resp 20 | Ht 67.0 in | Wt 171.0 lb

## 2023-09-18 DIAGNOSIS — G309 Alzheimer's disease, unspecified: Secondary | ICD-10-CM | POA: Diagnosis not present

## 2023-09-18 DIAGNOSIS — F0150 Vascular dementia without behavioral disturbance: Secondary | ICD-10-CM

## 2023-09-18 DIAGNOSIS — F0280 Dementia in other diseases classified elsewhere without behavioral disturbance: Secondary | ICD-10-CM

## 2023-09-18 MED ORDER — MEMANTINE HCL 10 MG PO TABS: ORAL_TABLET | ORAL | 3 refills | Status: DC

## 2023-09-18 NOTE — Patient Instructions (Signed)
It was a pleasure to see you today at our office.   Recommendations:  Continue Memantine 10mg  tablets 1 tablet twice daily.  Take donepezil  5 mg nightly, will consider increasing the medicine to 10 mg daily if OK with Cardiology  Follow up in 6 months   RECOMMENDATIONS FOR ALL PATIENTS WITH MEMORY PROBLEMS: 1. Continue to exercise (Recommend 30 minutes of walking everyday, or 3 hours every week) 2. Increase social interactions - continue going to [place] and enjoy social gatherings with friends and family 3. Eat healthy, avoid fried foods and eat more fruits and vegetables 4. Maintain adequate blood pressure, blood sugar, and blood cholesterol level. Reducing the risk of stroke and cardiovascular disease also helps promoting better memory. 5. Avoid stressful situations. Live a simple life and avoid aggravations. Organize your time and prepare for the next day in anticipation. 6. Sleep well, avoid any interruptions of sleep and avoid any distractions in the bedroom that may interfere with adequate sleep quality 7. Avoid sugar, avoid sweets as there is a strong link between excessive sugar intake, diabetes, and cognitive impairment We discussed the Mediterranean diet, which has been shown to help patients reduce the risk of progressive memory disorders and reduces cardiovascular risk. This includes eating fish, eat fruits and green leafy vegetables, nuts like almonds and hazelnuts, walnuts, and also use olive oil. Avoid fast foods and fried foods as much as possible. Avoid sweets and sugar as sugar use has been linked to worsening of memory function.  There is always a concern of gradual progression of memory problems. If this is the case, then we may need to adjust level of care according to patient needs. Support, both to the patient and caregiver, should then be put into place.    FALL PRECAUTIONS: Be cautious when walking. Scan the area for obstacles that may increase the risk of trips and  falls. When getting up in the mornings, sit up at the edge of the bed for a few minutes before getting out of bed. Consider elevating the bed at the head end to avoid drop of blood pressure when getting up. Walk always in a well-lit room (use night lights in the walls). Avoid area rugs or power cords from appliances in the middle of the walkways. Use a walker or a cane if necessary and consider physical therapy for balance exercise. Get your eyesight checked regularly.     HOME SAFETY: Consider the safety of the kitchen when operating appliances like stoves, microwave oven, and blender. Consider having supervision and share cooking responsibilities until no longer able to participate in those. Accidents with firearms and other hazards in the house should be identified and addressed as well.   ABILITY TO BE LEFT ALONE: If patient is unable to contact 911 operator, consider using LifeLine, or when the need is there, arrange for someone to stay with patients. Smoking is a fire hazard, consider supervision or cessation. Risk of wandering should be assessed by caregiver and if detected at any point, supervision and safe proof recommendations should be instituted.  MEDICATION SUPERVISION: Inability to self-administer medication needs to be constantly addressed. Implement a mechanism to ensure safe administration of the medications.       Mediterranean Diet A Mediterranean diet refers to food and lifestyle choices that are based on the traditions of countries located on the [employer]. This way of eating has been shown to help prevent certain conditions and improve outcomes for people who have chronic diseases, like kidney  disease and heart disease. What are tips for following this plan? Lifestyle  Cook and eat meals together with your family, when possible. Drink enough fluid to keep your urine clear or pale yellow. Be physically active every day. This includes: Aerobic exercise like running  or swimming. Leisure activities like gardening, walking, or housework. Get 7-8 hours of sleep each night. If recommended by your health care provider, drink red wine in moderation. This means 1 glass a day for nonpregnant women and 2 glasses a day for men. A glass of wine equals 5 oz (150 mL). Reading food labels  Check the serving size of packaged foods. For foods such as rice and pasta, the serving size refers to the amount of cooked product, not dry. Check the total fat in packaged foods. Avoid foods that have saturated fat or trans fats. Check the ingredients list for added sugars, such as corn syrup. Shopping  At the grocery store, buy most of your food from the areas near the walls of the store. This includes: Fresh fruits and vegetables (produce). Grains, beans, nuts, and seeds. Some of these may be available in unpackaged forms or large amounts (in bulk). Fresh seafood. Poultry and eggs. Low-fat dairy products. Buy whole ingredients instead of prepackaged foods. Buy fresh fruits and vegetables in-season from local farmers markets. Buy frozen fruits and vegetables in resealable bags. If you do not have access to quality fresh seafood, buy precooked frozen shrimp or canned fish, such as tuna, salmon, or sardines. Buy small amounts of raw or cooked vegetables, salads, or olives from the deli or salad bar at your store. Stock your pantry so you always have certain foods on hand, such as olive oil, canned tuna, canned tomatoes, rice, pasta, and beans. Cooking  Cook foods with extra-virgin olive oil instead of using butter or other vegetable oils. Have meat as a side dish, and have vegetables or grains as your main dish. This means having meat in small portions or adding small amounts of meat to foods like pasta or stew. Use beans or vegetables instead of meat in common dishes like chili or lasagna. Experiment with different cooking methods. Try roasting or broiling vegetables instead of  steaming or sauteing them. Add frozen vegetables to soups, stews, pasta, or rice. Add nuts or seeds for added healthy fat at each meal. You can add these to yogurt, salads, or vegetable dishes. Marinate fish or vegetables using olive oil, lemon juice, garlic, and fresh herbs. Meal planning  Plan to eat 1 vegetarian meal one day each week. Try to work up to 2 vegetarian meals, if possible. Eat seafood 2 or more times a week. Have healthy snacks readily available, such as: Vegetable sticks with hummus. Greek yogurt. Fruit and nut trail mix. Eat balanced meals throughout the week. This includes: Fruit: 2-3 servings a day Vegetables: 4-5 servings a day Low-fat dairy: 2 servings a day Fish, poultry, or lean meat: 1 serving a day Beans and legumes: 2 or more servings a week Nuts and seeds: 1-2 servings a day Whole grains: 6-8 servings a day Extra-virgin olive oil: 3-4 servings a day Limit red meat and sweets to only a few servings a month What are my food choices? Mediterranean diet Recommended Grains: Whole-grain pasta. Brown rice. Bulgar wheat. Polenta. Couscous. Whole-wheat bread. Orpah Cobb. Vegetables: Artichokes. Beets. Broccoli. Cabbage. Carrots. Eggplant. Green beans. Chard. Kale. Spinach. Onions. Leeks. Peas. Squash. Tomatoes. Peppers. Radishes. Fruits: Apples. Apricots. Avocado. Berries. Bananas. Cherries. Dates. Figs. Grapes. Lemons. Melon.  Oranges. Peaches. Plums. Pomegranate. Meats and other protein foods: Beans. Almonds. Sunflower seeds. Pine nuts. Peanuts. Cod. Salmon. Scallops. Shrimp. Tuna. Tilapia. Clams. Oysters. Eggs. Dairy: Low-fat milk. Cheese. Greek yogurt. Beverages: Water. Red wine. Herbal tea. Fats and oils: Extra virgin olive oil. Avocado oil. Grape seed oil. Sweets and desserts: [place] yogurt with honey. Baked apples. Poached pears. Trail mix. Seasoning and other foods: Basil. Cilantro. Coriander. Cumin. Mint. Parsley. Sage. Rosemary. Tarragon. Garlic.  Oregano. Thyme. Pepper. Balsalmic vinegar. Tahini. Hummus. Tomato sauce. Olives. Mushrooms. Limit these Grains: Prepackaged pasta or rice dishes. Prepackaged cereal with added sugar. Vegetables: Deep fried potatoes (french fries). Fruits: Fruit canned in syrup. Meats and other protein foods: Beef. Pork. Lamb. Poultry with skin. Hot dogs. Tomasa Blase. Dairy: Ice cream. Sour cream. Whole milk. Beverages: Juice. Sugar-sweetened soft drinks. Beer. Liquor and spirits. Fats and oils: Butter. Canola oil. Vegetable oil. Beef fat (tallow). Lard. Sweets and desserts: Cookies. Cakes. Pies. Candy. Seasoning and other foods: Mayonnaise. Premade sauces and marinades. The items listed may not be a complete list. Talk with your dietitian about what dietary choices are right for you. Summary The Mediterranean diet includes both food and lifestyle choices. Eat a variety of fresh fruits and vegetables, beans, nuts, seeds, and whole grains. Limit the amount of red meat and sweets that you eat. Talk with your health care provider about whether it is safe for you to drink red wine in moderation. This means 1 glass a day for nonpregnant women and 2 glasses a day for men. A glass of wine equals 5 oz (150 mL). This information is not intended to replace advice given to you by your health care provider. Make sure you discuss any questions you have with your health care provider. Document Released: 07/11/2016 Document Revised: 08/13/2016 Document Reviewed: 07/11/2016 Elsevier Interactive Patient Education  2017 [employer].    Continue

## 2023-09-18 NOTE — Progress Notes (Signed)
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 RH male with a history of  seen today in follow up for memory loss. Patient is currently on memantine 10 mg bid and donepezil 5 mg daily. Patient is able to participate on his ADLs with some assistance due to vision difficulties. Cognitive decline is noted. MoCA blind today is 6/22 . Discussed increasing donepezil, but will need Cards clearance, due to prior history of bradycardia, will follow. Mood is good    Follow up in 6  months. Continue donepezil 5mg  daily and memantine 10 mg twice daily.  Side effects discussed  Recommend repeat EKG to evaluate if increasing the dose of donepezil is an option  Recommend good control of her cardiovascular risk factors Continue to control mood as per PCP Continue PT for strength and balance     Subjective:    This patient is accompanied in the office by his wife   who supplements the history.  Previous records as well as any outside records available were reviewed prior to todays visit. Patient was last seen on 03/19/23 with MoCA blind 9/22    Any changes in memory since last visit? " A little worse"-wife says. Patient has some difficulty remembering recent conversations, new information and people names. He still likes to watch TV shows especially Westerns. He is not very active outside of the house. repeats oneself?  Endorsed but "not that much". Disoriented when walking into a room?  "Depends on the room, I may not remember that I have been there before"    Leaving objects in unusual places?  May misplace things but not in unusual places   Wandering behavior?  Denies.  He cannot walk outside alone due to decreased vision.   Any personality changes since last visit?  Denies.   Any worsening depression?:  Denies.   Hallucinations or paranoia?  Denies.   Seizures? Denies.    Any sleep changes?  Sleeps well, only wakes up to  urinate. Denies vivid dreams, REM behavior or sleepwalking   Sleep apnea?   Denies.   Any hygiene concerns? Denies.  Independent of bathing and dressing?  Needs assistance due to decreased vision. Does the patient needs help with medications? Wife is in charge   Who is in charge of the finances?  Wife is in charge     Any changes in appetite?  Denies.     Patient have trouble swallowing? Denies.   Does the patient cook? No Any headaches?   denies   Chronic back pain  denies   Ambulates with difficulty? Denies. Wife assists him, or uses cane. He is doing PT for strength and balance due to deconditioning  Recent falls or head injuries? denies     Unilateral weakness, numbness or tingling? denies   Any tremors?  Denies   Any anosmia?  Denies   Any incontinence of urine?  Endorsed. "He may need a change in underwear sometimes". Any bowel dysfunction?   Denies      Patient lives with wife       Initial visit 09/26/21 The patient is seen in neurologic consultation at the request of [name], MD for the evaluation of memory.  The patient is accompanied by his wife who supplements the history. This is a 82 y.o. year old male who has had memory issues for about 3-1/2 years, worse over the last 2 years, especially during  the COVID pandemic.  He repeats the same questions, sometimes he "does not know where he is ".  He also repeats the same sentences.  He lives with his wife, who noticed these changes as well.  His mood is good, denies depression or irritability.  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  He "reads very little "because he is eyesight is very poor.  He has issues with insomnia, sleeping 1 hour at the time, "gets up and goes to the bathroom, and a good night he will wake up once ".  He sleeps about 6 hours.  He denies any vivid dreams, or sleepwalking.  He denies any hallucinations or paranoia.  He denies leaving objects in unusual  places.  He is independent of bathing and dressing.  Wife administers the medication and manages the finances.  His appetite is "okay", eating 3 meals a day, denies trouble swallowing.  He does not cook, but can make a sandwich. Ambulates  without a walker, unless needed, denies any falls or head injuries.  He stopped driving in the [place]10[place] due to eyesight difficulties.  He denies any headaches, dizziness, focal numbness or tingling, unilateral weakness or tremors.  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  No history of seizures.  Denies constipation or diarrhea.  He has a history of urine incontinence.  He denies a history of sleep apnea, alcohol or tobacco.  Family history remarkable for aunt with dementia of unknown type.     MRI brain wo contrast 09/01/21  1. No acute intracranial abnormality.2. Progressive, moderate cerebral atrophy.3. Small chronic left parietal infarct.  B12 and TSH at the PCPs office were normal  PREVIOUS MEDICATIONS:   CURRENT MEDICATIONS:  Outpatient Encounter Medications as of 09/18/2023  Medication Sig   donepezil (ARICEPT) 5 MG tablet Take 1 tablet (5 mg total) by mouth at bedtime.   dorzolamide (TRUSOPT) 2 % ophthalmic solution Place 1 drop into both eyes 2 (two) times daily as needed.   dorzolamide-timolol (COSOPT) 22.3-6.8 MG/ML ophthalmic solution Place 1 drop into the left eye at bedtime.   doxazosin (CARDURA) 8 MG tablet Take 8 mg by mouth at bedtime.     fish oil-omega-3 fatty acids 1000 MG capsule Take 2 g by mouth 3 (three) times daily.   folic acid (FOLVITE) 1 MG tablet Take 1 mg by mouth daily.   Garlic 1000 MG CAPS Take by mouth daily.   Ginkgo Biloba 40 MG TABS Take by mouth daily.   lisinopril (PRINIVIL,ZESTRIL) 40 MG tablet Take 40 mg by mouth 2 (two) times daily.   LUTEIN PO Take by mouth 2 (two) times daily.   Melatonin 1 MG CAPS Take by mouth at bedtime.   metoprolol (LOPRESSOR) 50 MG tablet Take 50 mg by mouth 2 (two) times daily.      Multiple Vitamin (MULTIVITAMIN) tablet Take 1 tablet by mouth daily.   potassium chloride SA (K-DUR,KLOR-CON) 20 MEQ tablet    simvastatin (ZOCOR) 20 MG tablet Take 20 mg by mouth every evening.   torsemide (DEMADEX) 20 MG tablet 20 mg daily.    [DISCONTINUED] memantine (NAMENDA) 10 MG tablet Take 1 tablet (10 mg) twice a day   amLODipine (NORVASC) 10 MG tablet Take 10 mg by mouth daily.   Ascorbic Acid (VITAMIN C) 500 MG tablet Take 500 mg by mouth daily.  (Patient not taking: Reported on 09/17/2021)   B Complex Vitamins (B COMPLEX 100 PO)  Take by mouth daily.   (Patient not taking: Reported on 09/17/2021)   beta carotene 11914 UNIT capsule Take 25,000 Units by mouth daily.   (Patient not taking: Reported on 09/17/2021)   Bilberry 500 MG CAPS Take 500 mg by mouth daily.  (Patient not taking: Reported on 09/17/2021)   bimatoprost (LUMIGAN) 0.03 % ophthalmic drops Place 1 drop into both eyes at bedtime.   (Patient not taking: Reported on 03/19/2023)   Calcium-Magnesium-Zinc (CAL-MAG-ZINC PO) Take by mouth as directed.   (Patient not taking: Reported on 09/17/2022)   Chelated Zinc 50 MG TABS Take by mouth daily.   (Patient not taking: Reported on 09/17/2021)   Cinnamon 500 MG capsule Take 1,000 mg by mouth daily.  (Patient not taking: Reported on 09/17/2021)   Coenzyme Q10 (CO Q 10) 100 MG CAPS Take by mouth daily.   (Patient not taking: Reported on 09/17/2021)   memantine (NAMENDA) 10 MG tablet Take 1 tablet (10 mg) twice a day   No facility-administered encounter medications on file as of 09/18/2023.        No data to display            09/18/2023   12:00 PM 03/19/2023   11:00 AM 09/23/2022    1:00 PM 09/17/2022   11:00 AM 09/17/2021   10:00 AM  Montreal Cognitive Assessment   Attention: Read list of digits (0/2) 2 2 2 2 2   Attention: Read list of letters (0/1) 1 1 1 1 1   Attention: Serial 7 subtraction starting at 100 (0/3) 2 2 3 3 3   Language: Repeat phrase (0/2) 1 0 1 1 1    Language : Fluency (0/1) 0 0 0 0 1  Abstraction (0/2) 0 0 1 1 2   Delayed Recall (0/5) 0 2 0 0 0  Orientation (0/6) 0 1 1 1 2     Objective:     PHYSICAL EXAMINATION:    VITALS:   Vitals:   09/18/23 1053  BP: 101/61  Pulse: (!) 59  Resp: 20  SpO2: 97%  Weight: 171 lb (77.6 kg)  Height: 5\' 7"  (1.702 m)    GEN:  The patient appears stated age and is in NAD. HEENT:  Normocephalic, atraumatic.   Neurological examination:  General: NAD, well-groomed, appears stated age. Orientation: The patient is alert. Oriented to person, not to place and date Cranial nerves: There is good facial symmetry.The speech is fluent and clear. No aphasia or dysarthria. Fund of knowledge is reduced. Recent and remote memory are impaired. Attention and concentration are reduced.  Unable to name objects due to poor vision and unable to repeat phrases.  Hearing is decreased  to conversational tone.   Sensation: Sensation is intact to light touch throughout Motor: Strength is at least antigravity x4. DTR's 2/4 in UE/LE     Movement examination: Tone: There is normal tone in the UE/LE Abnormal movements:  no tremor.  No myoclonus.  No asterixis.   Coordination:  There is no decremation with RAM's. Normal finger to nose  Gait and Station: The patient has no  difficulty arising out of a deep-seated chair without the use of the hands. The patient's stride length is good.  Gait is cautious and narrow.    Thank you for allowing [place] the opportunity to participate in the care of this nice patient. Please do not hesitate to contact us for any questions or concerns.   Total time spent on today's visit was 34 minutes dedicated to this patient today,  preparing to see patient, examining the patient, ordering tests and/or medications and counseling the patient, documenting clinical information in the EHR or other health record, independently interpreting results and communicating results to the patient/family,  discussing treatment and goals, answering patient's questions and coordinating care.  Cc:  Chow, Zannie Cove, MD  Marlowe Kays 09/18/2023 12:33 PM

## 2023-10-08 DIAGNOSIS — R7309 Other abnormal glucose: Secondary | ICD-10-CM | POA: Diagnosis not present

## 2023-10-08 DIAGNOSIS — I10 Essential (primary) hypertension: Secondary | ICD-10-CM | POA: Diagnosis not present

## 2023-10-08 DIAGNOSIS — E785 Hyperlipidemia, unspecified: Secondary | ICD-10-CM | POA: Diagnosis not present

## 2023-10-08 DIAGNOSIS — G309 Alzheimer's disease, unspecified: Secondary | ICD-10-CM | POA: Diagnosis not present

## 2023-10-08 DIAGNOSIS — I2510 Atherosclerotic heart disease of native coronary artery without angina pectoris: Secondary | ICD-10-CM | POA: Diagnosis not present

## 2023-10-08 DIAGNOSIS — Z85118 Personal history of other malignant neoplasm of bronchus and lung: Secondary | ICD-10-CM | POA: Diagnosis not present

## 2023-10-08 DIAGNOSIS — E7800 Pure hypercholesterolemia, unspecified: Secondary | ICD-10-CM | POA: Diagnosis not present

## 2023-10-08 DIAGNOSIS — Z79899 Other long term (current) drug therapy: Secondary | ICD-10-CM | POA: Diagnosis not present

## 2023-10-09 DIAGNOSIS — D0462 Carcinoma in situ of skin of left upper limb, including shoulder: Secondary | ICD-10-CM | POA: Diagnosis not present

## 2023-10-15 ENCOUNTER — Ambulatory Visit: Payer: Medicare Other | Admitting: Podiatry

## 2023-10-15 ENCOUNTER — Encounter: Payer: Self-pay | Admitting: Podiatry

## 2023-10-15 DIAGNOSIS — B351 Tinea unguium: Secondary | ICD-10-CM | POA: Diagnosis not present

## 2023-10-15 DIAGNOSIS — M79674 Pain in right toe(s): Secondary | ICD-10-CM | POA: Diagnosis not present

## 2023-10-15 DIAGNOSIS — M79675 Pain in left toe(s): Secondary | ICD-10-CM | POA: Diagnosis not present

## 2023-10-15 DIAGNOSIS — M2041 Other hammer toe(s) (acquired), right foot: Secondary | ICD-10-CM | POA: Diagnosis not present

## 2023-10-15 NOTE — Progress Notes (Signed)
This patient presents to the office with his wife for painful second toe right foot.  He says he has worn a toe cap which was given his last visit even during sleep.  He says the tip of  the second toe has become painful to the touch.  The tip of his toe is markedly white and  mascerated. He presents to the office for evaluation and treatment.  General Appearance  Alert, conversant and in no acute stress.  Vascular  Dorsalis pedis and posterior tibial  pulses are palpable  bilaterally.  Capillary return is within normal limits  bilaterally. Temperature is within normal limits  bilaterally.  Neurologic  Senn-Weinstein monofilament wire test within normal limits  bilaterally. Muscle power within normal limits bilaterally.  Nails Thick disfigured discolored nails with subungual debris   second and third toes right foot.toes  No evidence of bacterial infection or drainage bilaterally.  Orthopedic  No limitations of motion  feet .  No crepitus or effusions noted.  No bony pathology or digital deformities noted. Hammer toe second toe right foot.  Skin  normotropic skin with no porokeratosis noted bilaterally.  No signs of infections or ulcers noted.   Significant masceration toe right foot.  Clavi second toe right foot.  Clavi due to hammer toe.  Masceration due to hammer toe.  ROV 2.  Debride toenail and mascerated skin second toe right foot.  Instructions for use of toe cap was given.    RTC 3 months   Helane Gunther DPM

## 2023-10-16 DIAGNOSIS — Z23 Encounter for immunization: Secondary | ICD-10-CM | POA: Diagnosis not present

## 2023-10-22 DIAGNOSIS — L814 Other melanin hyperpigmentation: Secondary | ICD-10-CM | POA: Diagnosis not present

## 2023-10-22 DIAGNOSIS — Z85828 Personal history of other malignant neoplasm of skin: Secondary | ICD-10-CM | POA: Diagnosis not present

## 2023-10-22 DIAGNOSIS — Z85820 Personal history of malignant melanoma of skin: Secondary | ICD-10-CM | POA: Diagnosis not present

## 2023-10-22 DIAGNOSIS — C44229 Squamous cell carcinoma of skin of left ear and external auricular canal: Secondary | ICD-10-CM | POA: Diagnosis not present

## 2023-10-22 DIAGNOSIS — L821 Other seborrheic keratosis: Secondary | ICD-10-CM | POA: Diagnosis not present

## 2023-10-22 DIAGNOSIS — D225 Melanocytic nevi of trunk: Secondary | ICD-10-CM | POA: Diagnosis not present

## 2023-10-22 DIAGNOSIS — Z08 Encounter for follow-up examination after completed treatment for malignant neoplasm: Secondary | ICD-10-CM | POA: Diagnosis not present

## 2023-10-22 DIAGNOSIS — Z86006 Personal history of melanoma in-situ: Secondary | ICD-10-CM | POA: Diagnosis not present

## 2023-10-22 DIAGNOSIS — D492 Neoplasm of unspecified behavior of bone, soft tissue, and skin: Secondary | ICD-10-CM | POA: Diagnosis not present

## 2023-11-07 DIAGNOSIS — E7800 Pure hypercholesterolemia, unspecified: Secondary | ICD-10-CM | POA: Diagnosis not present

## 2023-11-07 DIAGNOSIS — Z9989 Dependence on other enabling machines and devices: Secondary | ICD-10-CM | POA: Diagnosis not present

## 2023-11-07 DIAGNOSIS — I10 Essential (primary) hypertension: Secondary | ICD-10-CM | POA: Diagnosis not present

## 2023-11-07 DIAGNOSIS — I2510 Atherosclerotic heart disease of native coronary artery without angina pectoris: Secondary | ICD-10-CM | POA: Diagnosis not present

## 2023-11-07 DIAGNOSIS — D696 Thrombocytopenia, unspecified: Secondary | ICD-10-CM | POA: Diagnosis not present

## 2023-11-07 DIAGNOSIS — N1831 Chronic kidney disease, stage 3a: Secondary | ICD-10-CM | POA: Diagnosis not present

## 2023-12-01 DIAGNOSIS — Z85828 Personal history of other malignant neoplasm of skin: Secondary | ICD-10-CM | POA: Diagnosis not present

## 2023-12-01 DIAGNOSIS — Z951 Presence of aortocoronary bypass graft: Secondary | ICD-10-CM | POA: Diagnosis not present

## 2023-12-01 DIAGNOSIS — Z902 Acquired absence of lung [part of]: Secondary | ICD-10-CM | POA: Diagnosis not present

## 2023-12-01 DIAGNOSIS — Z85118 Personal history of other malignant neoplasm of bronchus and lung: Secondary | ICD-10-CM | POA: Diagnosis not present

## 2023-12-01 DIAGNOSIS — M47812 Spondylosis without myelopathy or radiculopathy, cervical region: Secondary | ICD-10-CM | POA: Diagnosis not present

## 2023-12-01 DIAGNOSIS — E860 Dehydration: Secondary | ICD-10-CM | POA: Diagnosis not present

## 2023-12-01 DIAGNOSIS — M47892 Other spondylosis, cervical region: Secondary | ICD-10-CM | POA: Diagnosis not present

## 2023-12-01 DIAGNOSIS — R918 Other nonspecific abnormal finding of lung field: Secondary | ICD-10-CM | POA: Diagnosis not present

## 2023-12-01 DIAGNOSIS — G309 Alzheimer's disease, unspecified: Secondary | ICD-10-CM | POA: Diagnosis not present

## 2023-12-01 DIAGNOSIS — E785 Hyperlipidemia, unspecified: Secondary | ICD-10-CM | POA: Diagnosis not present

## 2023-12-01 DIAGNOSIS — N179 Acute kidney failure, unspecified: Secondary | ICD-10-CM | POA: Diagnosis not present

## 2023-12-01 DIAGNOSIS — R531 Weakness: Secondary | ICD-10-CM | POA: Diagnosis not present

## 2023-12-01 DIAGNOSIS — G308 Other Alzheimer's disease: Secondary | ICD-10-CM | POA: Diagnosis not present

## 2023-12-01 DIAGNOSIS — Z923 Personal history of irradiation: Secondary | ICD-10-CM | POA: Diagnosis not present

## 2023-12-01 DIAGNOSIS — I10 Essential (primary) hypertension: Secondary | ICD-10-CM | POA: Diagnosis not present

## 2023-12-01 DIAGNOSIS — E870 Hyperosmolality and hypernatremia: Secondary | ICD-10-CM | POA: Diagnosis not present

## 2023-12-01 DIAGNOSIS — D6959 Other secondary thrombocytopenia: Secondary | ICD-10-CM | POA: Diagnosis not present

## 2023-12-01 DIAGNOSIS — M4692 Unspecified inflammatory spondylopathy, cervical region: Secondary | ICD-10-CM | POA: Diagnosis not present

## 2023-12-01 DIAGNOSIS — E7849 Other hyperlipidemia: Secondary | ICD-10-CM | POA: Diagnosis not present

## 2023-12-01 DIAGNOSIS — I160 Hypertensive urgency: Secondary | ICD-10-CM | POA: Diagnosis not present

## 2023-12-01 DIAGNOSIS — E876 Hypokalemia: Secondary | ICD-10-CM | POA: Diagnosis not present

## 2023-12-01 DIAGNOSIS — Z9221 Personal history of antineoplastic chemotherapy: Secondary | ICD-10-CM | POA: Diagnosis not present

## 2023-12-01 DIAGNOSIS — E878 Other disorders of electrolyte and fluid balance, not elsewhere classified: Secondary | ICD-10-CM | POA: Diagnosis not present

## 2023-12-01 DIAGNOSIS — S0990XA Unspecified injury of head, initial encounter: Secondary | ICD-10-CM | POA: Diagnosis not present

## 2023-12-01 DIAGNOSIS — G300 Alzheimer's disease with early onset: Secondary | ICD-10-CM | POA: Diagnosis not present

## 2023-12-01 DIAGNOSIS — E7800 Pure hypercholesterolemia, unspecified: Secondary | ICD-10-CM | POA: Diagnosis not present

## 2023-12-01 DIAGNOSIS — U071 COVID-19: Secondary | ICD-10-CM | POA: Diagnosis not present

## 2023-12-01 DIAGNOSIS — D696 Thrombocytopenia, unspecified: Secondary | ICD-10-CM | POA: Diagnosis not present

## 2023-12-01 DIAGNOSIS — I951 Orthostatic hypotension: Secondary | ICD-10-CM | POA: Diagnosis not present

## 2023-12-01 DIAGNOSIS — D698 Other specified hemorrhagic conditions: Secondary | ICD-10-CM | POA: Diagnosis not present

## 2023-12-05 NOTE — Care Plan (Signed)
 Physical Therapy Plan Of Care    Rodney Christensen is a 83 y.o.male presenting to [facility] with weakness, confusion and a fall. Pt admitted for COVID and hypertensive urgency. Pt legally blind.   General Observations  Subjective: Id rather be sitting up. Patient presentation: Patient received in bed, Patient is agreeable to participate Cognition: Impaired Safety judgement: Impaired   Objective    Pt found in bed, agreeable to PT session. Wife and daughter present at bedside.   Bed Mobility: Mod A with HOB elevated, use of bed rail, and MAX verbal and tactile cues.   Transfers: Sit>stand with max A, L foot sliding out. Shoes donned.  Sit>stand with Mod A to cane.  Pt tolerated stand pivot transfer to recliner chair with max A, shoes, cane, and max verbal and tactile cues. Pt legally blind with dementia increasing difficulty of transfer.  Activity Tolerance: Fair  Pt left seated in chair, chair alarm on, call bell in reach, fall mat in place, and all needs met.    PT Assessment and Recommendations   Based on current presentation and performance,  PT continues to recommend DC to STR. Pt presenting with impairments in strength, balance, and activity tolerance. Pt's impairments prevent him from performing functional mobility including bed mobility, transfers, and ambulation at baseline LOF. He will benefit from continued skilled PT to address above deficits.   Recommendations (staff):  Patient activity with staff: OOB to chair/commode with assist of 2, Toileting PT recommended toileting method: Bedside commode PT recommended safety precautions: Universal, Fall risk based on PT exam, Stay for safety based on PT exam  Discharge Recommendations / Assessment:  PT discharge recommendations: Short term Rehabilitation for standard therapy needs  DME Plan: PT DME Plan: DME needs to be addressed at Inpatient Rehabilitation Facility  (Insert flowsheet data):    12/05/23 1416   Interpreter Services: Must Complete All Cascading Rows!  Communication service needed for patient/companion  No aids or auxiliary services required  Mode of communication services deemed effective Yes, by patient  Demographics updated N/A- Demographics complete and correct.  Visit  Visit Type Treatment  Visit Date 12/05/23  General  Chart Reviewed Yes  General Observations  Subjective Id rather be sitting up.  Patient presentation Patient received in bed;Patient is agreeable to participate  Interventions  Therapeutic Exercise Exercise #1  Therapeutic Activity Training Transfers #1;Training Bed Mobility  Exercise #1  Therapeutic Exercise side, type, reps Bilateral  Isolated Exercise Type Ankle pumps;Heel Slides  Response to intervention Patient tolerated intervention well as demonstrated by;No increase in pain;Vitals stable  Skilled need Verbal cueing for initiation;Verbal cueing for sequencing of task;Verbal cueing for improved technique;Guidance for sequencing of task;Guidance for initiation;Guidance for improved technique;Education for exercise to be completed independently  Training Bed Mobility  Impairment observed Poor initiation;Poor sequencing of task;ROM/Strength limiting ability  Skill practiced Supine to sit  Modification implemented  Head of bed at 45 degrees;Bedrail  Level of assist required 3 - Moderate Assist: Patient performs 50-75% of effort  Response to intervention Patient tolerated intervention well as demonstrated by;No increase in pain;Vitals stable  Skilled need Verbal cueing for initiation;Verbal cueing for sequencing of task;Verbal cueing for improved technique;Guidance for initiation;Tactile cueing for initiation;Tactile cueing for sequencing of task;Tactile cueing for improved technique;Guidance for sequencing of task;Guidance for improved technique  Training Transfers #1   Type/Device Sit to stand;Stand pivot  Repetitions 2  Level of assist required 2 -  Maximal Assist: Patient performs 25-50% of effort  Response to  intervention Patient tolerated intervention well as demonstrated by;No increase in pain;Vitals stable  Skilled need Verbal cueing for initiation;Verbal cueing for sequencing of task;Verbal cueing for improved technique;Guidance for initiation;Guidance for sequencing of task;Guidance for improved technique;Tactile cueing for initiation;Tactile cueing for sequencing of task;Tactile cueing for improved technique  PT Recommendations  Patient activity with staff OOB to chair/commode with assist of 2;Toileting  PT recommended toileting method Bedside commode  PT recommended safety precautions Universal;Fall risk based on PT exam;Stay for safety based on PT exam  PT discharge recommendations Short term Rehabilitation for standard therapy needs  PT DME Plan DME needs to be addressed at Inpatient Rehabilitation Facility  PT identified barriers to discharge home Inadequate support at home for mobility/ADLs;Inadequate activity tolerance;# of stairs to enter/in home  PT plan for next visit Advance mobility level to achieve baseline level of function  Care Coordination  Communication with Registered Nurse  Communication with Registered Nurse Current medical status;Mobility status/ADLs;Via verbal  Non-billable encounter time (mins) 10 Minutes

## 2023-12-06 DIAGNOSIS — I10 Essential (primary) hypertension: Secondary | ICD-10-CM | POA: Diagnosis not present

## 2023-12-07 DIAGNOSIS — I10 Essential (primary) hypertension: Secondary | ICD-10-CM | POA: Diagnosis not present

## 2023-12-09 NOTE — Care Plan (Signed)
 Physical Therapy Plan Of Care    Rodney Christensen is a 83 y.o.male presenting to [facility] with weakness, confusion and a fall. Pt admitted for COVID and hypertensive urgency. Pt legally blind.   General Observations  Subjective: Im tired. Patient presentation: Patient received in bed, Patient is agreeable to participate Cognition: Impaired Safety judgement: Impaired   Objective    Pt seen for follow up PT session, found in bed, agreeable to session.   Bed Mobility:  Shoes donned while in bed. Min A supine>sitting EOB with HOB elevated and use of bed rail, max verbal and tactile cues. Pt able to move LE off EOB with cues.   Transfers: Min A x 1 sit>stand to cane, pt with posterior lean upon standing, requiring cues and assist to maintain balance. Pt required Min A x 2 for few steps to recliner chair, max verbal and tactile cues due to visual impairment. Pt left seated in chair, chair alarm on, call bell in reach, and all needs met.   Activity Tolerance: Fair, pt with improved participation and functional mobility this session.    PT Assessment and Recommendations   Based on current presentation and performance,  PT cont to recommend DC to STR. Pt continues with impairments in strength, balance, and activity tolerance. He is below his baseline LOF and will continue to benefit from skilled PT to address above deficits.   Recommendations (staff):  Patient activity with staff: OOB to chair/commode with assist of 2, Toileting PT recommended toileting method: Bedside commode PT recommended safety precautions: Universal, Fall risk based on PT exam, Stay for safety based on PT exam  Discharge Recommendations / Assessment:  PT discharge recommendations: Short term Rehabilitation for standard therapy needs  DME Plan: PT DME Plan: DME needs to be addressed at Inpatient Rehabilitation Facility  (Insert flowsheet data):    12/09/23 1501  Interpreter Services: Must Complete All  Cascading Rows!  Communication service needed for patient/companion  No aids or auxiliary services required  Mode of communication services deemed effective Yes, by patient  Demographics updated N/A- Demographics complete and correct.  Visit  Visit Type Treatment  Visit Date 12/09/23  General  Chart Reviewed Yes  General Observations  Subjective Im tired.  Patient presentation Patient received in bed;Patient is agreeable to participate  Interventions  Therapeutic Activity Training Bed Mobility;Training Transfers #1  Training Bed Mobility  Impairment observed Poor initiation;Poor sequencing of task;ROM/Strength limiting ability  Skill practiced Supine to sit  Modification implemented  Head of bed at 45 degrees;Bedrail  Level of assist required 4 - Minimal assist / Contact guarding: Patient performs >75% of effort  Response to intervention Patient tolerated intervention well as demonstrated by;No increase in pain;Vitals stable  Skilled need Verbal cueing for initiation;Verbal cueing for sequencing of task;Verbal cueing for improved technique;Tactile cueing for initiation;Tactile cueing for sequencing of task;Tactile cueing for improved technique;Guidance for initiation;Guidance for sequencing of task;Guidance for improved technique  Training Transfers #1   Type/Device Sit to stand;Stand pivot;Stand to sit  Repetitions 1  Level of assist required 1 -  Assist of 2 (Min A x 2, max verbal cues due to visual impairment)  Response to intervention Patient tolerated intervention well as demonstrated by;No increase in pain;Vitals stable  Skilled need Verbal cueing for initiation;Verbal cueing for sequencing of task;Verbal cueing for improved technique;Tactile cueing for initiation;Tactile cueing for sequencing of task;Tactile cueing for improved technique;Guidance for initiation;Guidance for sequencing of task;Guidance for improved technique  PT Recommendations  Patient activity with  staff OOB to  chair/commode with assist of 2;Toileting  PT recommended toileting method Bedside commode  PT recommended safety precautions Universal;Fall risk based on PT exam;Stay for safety based on PT exam  PT discharge recommendations Short term Rehabilitation for standard therapy needs  PT DME Plan DME needs to be addressed at Inpatient Rehabilitation Facility  PT identified barriers to discharge home Inadequate support at home for mobility/ADLs;# of stairs to enter/in home;Inadequate activity tolerance  PT plan for next visit Advance mobility level to achieve baseline level of function  Care Coordination  Non-billable encounter time (mins) 10 Minutes

## 2023-12-10 NOTE — Care Plan (Signed)
 Registered Dietitian Clinical Nutrition Note  Nutrition Assessment type: Follow-up Rodney Christensen is a 83 y.o. male admitted with COVID.    Pt remains medically ready to discharge, pending insurance auth/STR. COVID+. Continues with poor oral intake, consuming bites-50% of meals. Continues with ONS every meal. Weights relatively stable during admission.   Lab Results  Component Value Date   GLUCOSE 92 12/07/2023   CALCIUM 7.9 (L) 12/07/2023   NA 140 12/07/2023   K 4.0 12/07/2023   CO2 25 12/07/2023   CL 108 12/07/2023   BUN 22 12/07/2023   CREATININE 1.00 12/07/2023   MG 2.4 12/01/2023   Pertinent Meds: Pertinent meds: heparin, bowel meds, torsemide  History: Past Medical History: dementia (likely Alzheimer's and vascular disease), hypertension, hyperlipidemia, NSCLC - SCC s/p chemoradiation therapy and left lower lobectomy with metastatic lymph node dissection in 2012, quadruple bypass in 2003, skin cancer s/p resection, Previously prescribed diet: Unrestricted, Regular PO intake/appetite PTA: Wife at bedside proving information. Eating well at home up until got sick and started feeling weak and ill. Generally consumes 3 meals a day plus snack. Confirms no food allergies, no difficulty chewing or swallowing but wife does cut meat up for him. Food Insecurity: No Food Insecurity (12/02/2023)   Social work referral not Catering manager and Nutrient Intake: Current Prescribed Diet : Regular; Unrestricted Current Prescribed Supplements: Ensure Clear (TID (preference for clear instead of creamy ensures)) Problems or pain related to eating: Decreased Appetite Current PO intake/appetite: Continues poor PO intake, 0-50% of meals.  Anthropometrics, Skin  Integrity & GI Function: Height: 170.2 cm (67) Weight: 69.5 kg (153 lb 3.2 oz) Weight History: 77.6 kg (171 lb) (09/18/23) Weight Change: -17 lb (9.9%) in 3 months. (Wife denies any significant weight changes. Believes current weight of  154 lb is his usual. Minimal weight history in EMR) BMI (Calculated): 24 kg   Last BM Date: 12/10/23  Calculated Energy Needs: Weight Used for Equation Calculations: 70 kg (154 lb 5.2 oz)   Estimated energy needs (kcal/kg): 1750-2100 kcal/day (25-30 kcal/kg) Estimated Protein Needs (g/kg): 70-84 g/day (1-1.2 g/kg)   Estimated Fluid Needs: 1.8 L or per team  Nutrition Diagnosis: Nutrition Diagnosis #1: Inadequate oral intake related to: COVID+, decreased appetite as evidenced by: family reports, intake records during admission Nutrition Diagnosis #1 Status : Ongoing  Interventions & Recommendations: Meals and snacks recommendation(s): Continue regular unrestriced Recommended nutrition supplement : Ensure Clears (TID berry) Feeding assistance recommendation(s): Assist with feeding; Meal set-up; Upright for meals Nutrition-related medication and Micronutrient Management : Bowel regimen Patient care recommendations: Daily weight; Electrolytes Nutrition education provided: No  Nutrition Goals: Nutrition Goal #1: PO intake >65% of meals by follow up Goal #1 Status: Ongoing;Not met Nutrition Goal #2: weight stable by f/u Goal #2 Status: Not met;Ongoing Comment: weight decreasing  Monitoring & Evaluation: Anthropometric Measurements: weight Lab Profiles: Electrolyte Profile Food and Nutrient Intake: Total Meal Intake;Oral Supplement Intake Nutrition-Focused Digestive System Findings: Appetite Nutrition-Focused Skin Findings: Skin integrity  Coordination of Nutrition Care: Nutrition Discharge Plan: Oral supplements Comment: recommend oral supplement if intake inadequate (Ensure clear, boost breeze)  Malnutrition Status: Malnutrition Present: At risk, will reassess     Chantel Morin, RD

## 2023-12-11 DIAGNOSIS — R609 Edema, unspecified: Secondary | ICD-10-CM | POA: Diagnosis not present

## 2023-12-11 DIAGNOSIS — G308 Other Alzheimer's disease: Secondary | ICD-10-CM | POA: Diagnosis not present

## 2023-12-11 DIAGNOSIS — M79605 Pain in left leg: Secondary | ICD-10-CM | POA: Diagnosis not present

## 2023-12-11 DIAGNOSIS — G300 Alzheimer's disease with early onset: Secondary | ICD-10-CM | POA: Diagnosis not present

## 2023-12-11 DIAGNOSIS — M6281 Muscle weakness (generalized): Secondary | ICD-10-CM | POA: Diagnosis not present

## 2023-12-11 DIAGNOSIS — D698 Other specified hemorrhagic conditions: Secondary | ICD-10-CM | POA: Diagnosis not present

## 2023-12-11 DIAGNOSIS — U071 COVID-19: Secondary | ICD-10-CM | POA: Diagnosis not present

## 2023-12-11 DIAGNOSIS — I2510 Atherosclerotic heart disease of native coronary artery without angina pectoris: Secondary | ICD-10-CM | POA: Diagnosis not present

## 2023-12-11 DIAGNOSIS — R600 Localized edema: Secondary | ICD-10-CM | POA: Diagnosis not present

## 2023-12-11 DIAGNOSIS — E870 Hyperosmolality and hypernatremia: Secondary | ICD-10-CM | POA: Diagnosis not present

## 2023-12-11 DIAGNOSIS — E785 Hyperlipidemia, unspecified: Secondary | ICD-10-CM | POA: Diagnosis not present

## 2023-12-11 DIAGNOSIS — Z85118 Personal history of other malignant neoplasm of bronchus and lung: Secondary | ICD-10-CM | POA: Diagnosis not present

## 2023-12-11 DIAGNOSIS — I10 Essential (primary) hypertension: Secondary | ICD-10-CM | POA: Diagnosis not present

## 2023-12-11 DIAGNOSIS — R1310 Dysphagia, unspecified: Secondary | ICD-10-CM | POA: Diagnosis not present

## 2023-12-11 DIAGNOSIS — D696 Thrombocytopenia, unspecified: Secondary | ICD-10-CM | POA: Diagnosis not present

## 2023-12-11 DIAGNOSIS — E876 Hypokalemia: Secondary | ICD-10-CM | POA: Diagnosis not present

## 2023-12-11 DIAGNOSIS — Z7409 Other reduced mobility: Secondary | ICD-10-CM | POA: Diagnosis not present

## 2023-12-11 DIAGNOSIS — D6959 Other secondary thrombocytopenia: Secondary | ICD-10-CM | POA: Diagnosis not present

## 2023-12-11 DIAGNOSIS — R531 Weakness: Secondary | ICD-10-CM | POA: Diagnosis not present

## 2023-12-11 DIAGNOSIS — M47892 Other spondylosis, cervical region: Secondary | ICD-10-CM | POA: Diagnosis not present

## 2023-12-11 DIAGNOSIS — N179 Acute kidney failure, unspecified: Secondary | ICD-10-CM | POA: Diagnosis not present

## 2023-12-11 DIAGNOSIS — Z741 Need for assistance with personal care: Secondary | ICD-10-CM | POA: Diagnosis not present

## 2023-12-11 DIAGNOSIS — E7849 Other hyperlipidemia: Secondary | ICD-10-CM | POA: Diagnosis not present

## 2023-12-12 DIAGNOSIS — G308 Other Alzheimer's disease: Secondary | ICD-10-CM | POA: Diagnosis not present

## 2023-12-12 DIAGNOSIS — D696 Thrombocytopenia, unspecified: Secondary | ICD-10-CM | POA: Diagnosis not present

## 2023-12-12 DIAGNOSIS — U071 COVID-19: Secondary | ICD-10-CM | POA: Diagnosis not present

## 2023-12-12 DIAGNOSIS — I10 Essential (primary) hypertension: Secondary | ICD-10-CM | POA: Diagnosis not present

## 2023-12-12 DIAGNOSIS — E870 Hyperosmolality and hypernatremia: Secondary | ICD-10-CM | POA: Diagnosis not present

## 2023-12-12 DIAGNOSIS — N179 Acute kidney failure, unspecified: Secondary | ICD-10-CM | POA: Diagnosis not present

## 2023-12-12 DIAGNOSIS — R531 Weakness: Secondary | ICD-10-CM | POA: Diagnosis not present

## 2023-12-12 DIAGNOSIS — E876 Hypokalemia: Secondary | ICD-10-CM | POA: Diagnosis not present

## 2023-12-12 DIAGNOSIS — I2510 Atherosclerotic heart disease of native coronary artery without angina pectoris: Secondary | ICD-10-CM | POA: Diagnosis not present

## 2023-12-12 DIAGNOSIS — Z85118 Personal history of other malignant neoplasm of bronchus and lung: Secondary | ICD-10-CM | POA: Diagnosis not present

## 2023-12-12 DIAGNOSIS — R1310 Dysphagia, unspecified: Secondary | ICD-10-CM | POA: Diagnosis not present

## 2023-12-15 DIAGNOSIS — G308 Other Alzheimer's disease: Secondary | ICD-10-CM | POA: Diagnosis not present

## 2023-12-15 DIAGNOSIS — U071 COVID-19: Secondary | ICD-10-CM | POA: Diagnosis not present

## 2023-12-15 DIAGNOSIS — M6281 Muscle weakness (generalized): Secondary | ICD-10-CM | POA: Diagnosis not present

## 2023-12-15 DIAGNOSIS — I10 Essential (primary) hypertension: Secondary | ICD-10-CM | POA: Diagnosis not present

## 2023-12-15 DIAGNOSIS — Z741 Need for assistance with personal care: Secondary | ICD-10-CM | POA: Diagnosis not present

## 2023-12-15 DIAGNOSIS — Z7409 Other reduced mobility: Secondary | ICD-10-CM | POA: Diagnosis not present

## 2023-12-15 DIAGNOSIS — M47892 Other spondylosis, cervical region: Secondary | ICD-10-CM | POA: Diagnosis not present

## 2023-12-16 DIAGNOSIS — I10 Essential (primary) hypertension: Secondary | ICD-10-CM | POA: Diagnosis not present

## 2023-12-16 DIAGNOSIS — D696 Thrombocytopenia, unspecified: Secondary | ICD-10-CM | POA: Diagnosis not present

## 2023-12-16 DIAGNOSIS — R531 Weakness: Secondary | ICD-10-CM | POA: Diagnosis not present

## 2023-12-16 DIAGNOSIS — U071 COVID-19: Secondary | ICD-10-CM | POA: Diagnosis not present

## 2023-12-16 DIAGNOSIS — N179 Acute kidney failure, unspecified: Secondary | ICD-10-CM | POA: Diagnosis not present

## 2023-12-16 DIAGNOSIS — E876 Hypokalemia: Secondary | ICD-10-CM | POA: Diagnosis not present

## 2023-12-24 DIAGNOSIS — M47892 Other spondylosis, cervical region: Secondary | ICD-10-CM | POA: Diagnosis not present

## 2023-12-24 DIAGNOSIS — U071 COVID-19: Secondary | ICD-10-CM | POA: Diagnosis not present

## 2023-12-24 DIAGNOSIS — D696 Thrombocytopenia, unspecified: Secondary | ICD-10-CM | POA: Diagnosis not present

## 2023-12-24 DIAGNOSIS — Z7409 Other reduced mobility: Secondary | ICD-10-CM | POA: Diagnosis not present

## 2023-12-24 DIAGNOSIS — N179 Acute kidney failure, unspecified: Secondary | ICD-10-CM | POA: Diagnosis not present

## 2023-12-24 DIAGNOSIS — I10 Essential (primary) hypertension: Secondary | ICD-10-CM | POA: Diagnosis not present

## 2023-12-24 DIAGNOSIS — Z741 Need for assistance with personal care: Secondary | ICD-10-CM | POA: Diagnosis not present

## 2023-12-24 DIAGNOSIS — R531 Weakness: Secondary | ICD-10-CM | POA: Diagnosis not present

## 2023-12-24 DIAGNOSIS — M6281 Muscle weakness (generalized): Secondary | ICD-10-CM | POA: Diagnosis not present

## 2023-12-24 DIAGNOSIS — G308 Other Alzheimer's disease: Secondary | ICD-10-CM | POA: Diagnosis not present

## 2023-12-25 DIAGNOSIS — M79605 Pain in left leg: Secondary | ICD-10-CM | POA: Diagnosis not present

## 2023-12-25 DIAGNOSIS — R531 Weakness: Secondary | ICD-10-CM | POA: Diagnosis not present

## 2023-12-25 DIAGNOSIS — R609 Edema, unspecified: Secondary | ICD-10-CM | POA: Diagnosis not present

## 2023-12-29 DIAGNOSIS — G308 Other Alzheimer's disease: Secondary | ICD-10-CM | POA: Diagnosis not present

## 2023-12-29 DIAGNOSIS — I10 Essential (primary) hypertension: Secondary | ICD-10-CM | POA: Diagnosis not present

## 2023-12-31 DIAGNOSIS — R600 Localized edema: Secondary | ICD-10-CM | POA: Diagnosis not present

## 2023-12-31 DIAGNOSIS — R531 Weakness: Secondary | ICD-10-CM | POA: Diagnosis not present

## 2023-12-31 DIAGNOSIS — I10 Essential (primary) hypertension: Secondary | ICD-10-CM | POA: Diagnosis not present

## 2023-12-31 DIAGNOSIS — M47892 Other spondylosis, cervical region: Secondary | ICD-10-CM | POA: Diagnosis not present

## 2024-01-02 DIAGNOSIS — M79605 Pain in left leg: Secondary | ICD-10-CM | POA: Diagnosis not present

## 2024-01-02 DIAGNOSIS — R600 Localized edema: Secondary | ICD-10-CM | POA: Diagnosis not present

## 2024-01-02 DIAGNOSIS — R531 Weakness: Secondary | ICD-10-CM | POA: Diagnosis not present

## 2024-01-02 DIAGNOSIS — I10 Essential (primary) hypertension: Secondary | ICD-10-CM | POA: Diagnosis not present

## 2024-01-04 DIAGNOSIS — R531 Weakness: Secondary | ICD-10-CM | POA: Diagnosis not present

## 2024-01-04 DIAGNOSIS — M79605 Pain in left leg: Secondary | ICD-10-CM | POA: Diagnosis not present

## 2024-01-04 DIAGNOSIS — U071 COVID-19: Secondary | ICD-10-CM | POA: Diagnosis not present

## 2024-01-04 DIAGNOSIS — Z85118 Personal history of other malignant neoplasm of bronchus and lung: Secondary | ICD-10-CM | POA: Diagnosis not present

## 2024-01-04 DIAGNOSIS — Z556 Problems related to health literacy: Secondary | ICD-10-CM | POA: Diagnosis not present

## 2024-01-04 DIAGNOSIS — Z95828 Presence of other vascular implants and grafts: Secondary | ICD-10-CM | POA: Diagnosis not present

## 2024-01-04 DIAGNOSIS — E785 Hyperlipidemia, unspecified: Secondary | ICD-10-CM | POA: Diagnosis not present

## 2024-01-04 DIAGNOSIS — Z9221 Personal history of antineoplastic chemotherapy: Secondary | ICD-10-CM | POA: Diagnosis not present

## 2024-01-04 DIAGNOSIS — Z951 Presence of aortocoronary bypass graft: Secondary | ICD-10-CM | POA: Diagnosis not present

## 2024-01-04 DIAGNOSIS — R600 Localized edema: Secondary | ICD-10-CM | POA: Diagnosis not present

## 2024-01-04 DIAGNOSIS — G308 Other Alzheimer's disease: Secondary | ICD-10-CM | POA: Diagnosis not present

## 2024-01-04 DIAGNOSIS — N179 Acute kidney failure, unspecified: Secondary | ICD-10-CM | POA: Diagnosis not present

## 2024-01-04 DIAGNOSIS — I10 Essential (primary) hypertension: Secondary | ICD-10-CM | POA: Diagnosis not present

## 2024-01-04 DIAGNOSIS — Z923 Personal history of irradiation: Secondary | ICD-10-CM | POA: Diagnosis not present

## 2024-01-04 DIAGNOSIS — M47892 Other spondylosis, cervical region: Secondary | ICD-10-CM | POA: Diagnosis not present

## 2024-01-04 DIAGNOSIS — Z902 Acquired absence of lung [part of]: Secondary | ICD-10-CM | POA: Diagnosis not present

## 2024-01-04 DIAGNOSIS — D6959 Other secondary thrombocytopenia: Secondary | ICD-10-CM | POA: Diagnosis not present

## 2024-01-05 DIAGNOSIS — M47892 Other spondylosis, cervical region: Secondary | ICD-10-CM | POA: Diagnosis not present

## 2024-01-05 DIAGNOSIS — R531 Weakness: Secondary | ICD-10-CM | POA: Diagnosis not present

## 2024-01-05 DIAGNOSIS — U071 COVID-19: Secondary | ICD-10-CM | POA: Diagnosis not present

## 2024-01-05 DIAGNOSIS — Z923 Personal history of irradiation: Secondary | ICD-10-CM | POA: Diagnosis not present

## 2024-01-05 DIAGNOSIS — Z95828 Presence of other vascular implants and grafts: Secondary | ICD-10-CM | POA: Diagnosis not present

## 2024-01-05 DIAGNOSIS — Z951 Presence of aortocoronary bypass graft: Secondary | ICD-10-CM | POA: Diagnosis not present

## 2024-01-05 DIAGNOSIS — G308 Other Alzheimer's disease: Secondary | ICD-10-CM | POA: Diagnosis not present

## 2024-01-05 DIAGNOSIS — I10 Essential (primary) hypertension: Secondary | ICD-10-CM | POA: Diagnosis not present

## 2024-01-05 DIAGNOSIS — N179 Acute kidney failure, unspecified: Secondary | ICD-10-CM | POA: Diagnosis not present

## 2024-01-05 DIAGNOSIS — R600 Localized edema: Secondary | ICD-10-CM | POA: Diagnosis not present

## 2024-01-05 DIAGNOSIS — Z85118 Personal history of other malignant neoplasm of bronchus and lung: Secondary | ICD-10-CM | POA: Diagnosis not present

## 2024-01-05 DIAGNOSIS — D6959 Other secondary thrombocytopenia: Secondary | ICD-10-CM | POA: Diagnosis not present

## 2024-01-05 DIAGNOSIS — E785 Hyperlipidemia, unspecified: Secondary | ICD-10-CM | POA: Diagnosis not present

## 2024-01-05 DIAGNOSIS — Z902 Acquired absence of lung [part of]: Secondary | ICD-10-CM | POA: Diagnosis not present

## 2024-01-05 DIAGNOSIS — M79605 Pain in left leg: Secondary | ICD-10-CM | POA: Diagnosis not present

## 2024-01-05 DIAGNOSIS — Z9221 Personal history of antineoplastic chemotherapy: Secondary | ICD-10-CM | POA: Diagnosis not present

## 2024-01-05 DIAGNOSIS — Z556 Problems related to health literacy: Secondary | ICD-10-CM | POA: Diagnosis not present

## 2024-01-09 DIAGNOSIS — I10 Essential (primary) hypertension: Secondary | ICD-10-CM | POA: Diagnosis not present

## 2024-01-09 DIAGNOSIS — Z95828 Presence of other vascular implants and grafts: Secondary | ICD-10-CM | POA: Diagnosis not present

## 2024-01-09 DIAGNOSIS — Z923 Personal history of irradiation: Secondary | ICD-10-CM | POA: Diagnosis not present

## 2024-01-09 DIAGNOSIS — E785 Hyperlipidemia, unspecified: Secondary | ICD-10-CM | POA: Diagnosis not present

## 2024-01-09 DIAGNOSIS — M47892 Other spondylosis, cervical region: Secondary | ICD-10-CM | POA: Diagnosis not present

## 2024-01-09 DIAGNOSIS — Z556 Problems related to health literacy: Secondary | ICD-10-CM | POA: Diagnosis not present

## 2024-01-09 DIAGNOSIS — G308 Other Alzheimer's disease: Secondary | ICD-10-CM | POA: Diagnosis not present

## 2024-01-09 DIAGNOSIS — Z9221 Personal history of antineoplastic chemotherapy: Secondary | ICD-10-CM | POA: Diagnosis not present

## 2024-01-09 DIAGNOSIS — Z951 Presence of aortocoronary bypass graft: Secondary | ICD-10-CM | POA: Diagnosis not present

## 2024-01-09 DIAGNOSIS — Z902 Acquired absence of lung [part of]: Secondary | ICD-10-CM | POA: Diagnosis not present

## 2024-01-09 DIAGNOSIS — D6959 Other secondary thrombocytopenia: Secondary | ICD-10-CM | POA: Diagnosis not present

## 2024-01-09 DIAGNOSIS — U071 COVID-19: Secondary | ICD-10-CM | POA: Diagnosis not present

## 2024-01-09 DIAGNOSIS — R531 Weakness: Secondary | ICD-10-CM | POA: Diagnosis not present

## 2024-01-09 DIAGNOSIS — Z85118 Personal history of other malignant neoplasm of bronchus and lung: Secondary | ICD-10-CM | POA: Diagnosis not present

## 2024-01-09 DIAGNOSIS — R600 Localized edema: Secondary | ICD-10-CM | POA: Diagnosis not present

## 2024-01-09 DIAGNOSIS — N179 Acute kidney failure, unspecified: Secondary | ICD-10-CM | POA: Diagnosis not present

## 2024-01-09 DIAGNOSIS — M79605 Pain in left leg: Secondary | ICD-10-CM | POA: Diagnosis not present

## 2024-01-10 DIAGNOSIS — R531 Weakness: Secondary | ICD-10-CM | POA: Diagnosis not present

## 2024-01-10 DIAGNOSIS — Z85118 Personal history of other malignant neoplasm of bronchus and lung: Secondary | ICD-10-CM | POA: Diagnosis not present

## 2024-01-10 DIAGNOSIS — Z923 Personal history of irradiation: Secondary | ICD-10-CM | POA: Diagnosis not present

## 2024-01-10 DIAGNOSIS — U071 COVID-19: Secondary | ICD-10-CM | POA: Diagnosis not present

## 2024-01-10 DIAGNOSIS — Z9221 Personal history of antineoplastic chemotherapy: Secondary | ICD-10-CM | POA: Diagnosis not present

## 2024-01-10 DIAGNOSIS — Z95828 Presence of other vascular implants and grafts: Secondary | ICD-10-CM | POA: Diagnosis not present

## 2024-01-10 DIAGNOSIS — M79605 Pain in left leg: Secondary | ICD-10-CM | POA: Diagnosis not present

## 2024-01-10 DIAGNOSIS — Z556 Problems related to health literacy: Secondary | ICD-10-CM | POA: Diagnosis not present

## 2024-01-10 DIAGNOSIS — D6959 Other secondary thrombocytopenia: Secondary | ICD-10-CM | POA: Diagnosis not present

## 2024-01-10 DIAGNOSIS — E785 Hyperlipidemia, unspecified: Secondary | ICD-10-CM | POA: Diagnosis not present

## 2024-01-10 DIAGNOSIS — R600 Localized edema: Secondary | ICD-10-CM | POA: Diagnosis not present

## 2024-01-10 DIAGNOSIS — I10 Essential (primary) hypertension: Secondary | ICD-10-CM | POA: Diagnosis not present

## 2024-01-10 DIAGNOSIS — Z902 Acquired absence of lung [part of]: Secondary | ICD-10-CM | POA: Diagnosis not present

## 2024-01-10 DIAGNOSIS — M47892 Other spondylosis, cervical region: Secondary | ICD-10-CM | POA: Diagnosis not present

## 2024-01-10 DIAGNOSIS — N179 Acute kidney failure, unspecified: Secondary | ICD-10-CM | POA: Diagnosis not present

## 2024-01-10 DIAGNOSIS — G308 Other Alzheimer's disease: Secondary | ICD-10-CM | POA: Diagnosis not present

## 2024-01-10 DIAGNOSIS — Z951 Presence of aortocoronary bypass graft: Secondary | ICD-10-CM | POA: Diagnosis not present

## 2024-01-12 DIAGNOSIS — Z902 Acquired absence of lung [part of]: Secondary | ICD-10-CM | POA: Diagnosis not present

## 2024-01-12 DIAGNOSIS — Z556 Problems related to health literacy: Secondary | ICD-10-CM | POA: Diagnosis not present

## 2024-01-12 DIAGNOSIS — U071 COVID-19: Secondary | ICD-10-CM | POA: Diagnosis not present

## 2024-01-12 DIAGNOSIS — E785 Hyperlipidemia, unspecified: Secondary | ICD-10-CM | POA: Diagnosis not present

## 2024-01-12 DIAGNOSIS — Z9221 Personal history of antineoplastic chemotherapy: Secondary | ICD-10-CM | POA: Diagnosis not present

## 2024-01-12 DIAGNOSIS — Z85118 Personal history of other malignant neoplasm of bronchus and lung: Secondary | ICD-10-CM | POA: Diagnosis not present

## 2024-01-12 DIAGNOSIS — M79605 Pain in left leg: Secondary | ICD-10-CM | POA: Diagnosis not present

## 2024-01-12 DIAGNOSIS — I10 Essential (primary) hypertension: Secondary | ICD-10-CM | POA: Diagnosis not present

## 2024-01-12 DIAGNOSIS — Z95828 Presence of other vascular implants and grafts: Secondary | ICD-10-CM | POA: Diagnosis not present

## 2024-01-12 DIAGNOSIS — Z951 Presence of aortocoronary bypass graft: Secondary | ICD-10-CM | POA: Diagnosis not present

## 2024-01-12 DIAGNOSIS — R600 Localized edema: Secondary | ICD-10-CM | POA: Diagnosis not present

## 2024-01-12 DIAGNOSIS — M47892 Other spondylosis, cervical region: Secondary | ICD-10-CM | POA: Diagnosis not present

## 2024-01-12 DIAGNOSIS — D6959 Other secondary thrombocytopenia: Secondary | ICD-10-CM | POA: Diagnosis not present

## 2024-01-12 DIAGNOSIS — N179 Acute kidney failure, unspecified: Secondary | ICD-10-CM | POA: Diagnosis not present

## 2024-01-12 DIAGNOSIS — Z923 Personal history of irradiation: Secondary | ICD-10-CM | POA: Diagnosis not present

## 2024-01-12 DIAGNOSIS — G308 Other Alzheimer's disease: Secondary | ICD-10-CM | POA: Diagnosis not present

## 2024-01-12 DIAGNOSIS — R531 Weakness: Secondary | ICD-10-CM | POA: Diagnosis not present

## 2024-01-15 ENCOUNTER — Ambulatory Visit: Payer: Medicare Other | Admitting: Podiatry

## 2024-01-16 DIAGNOSIS — D6959 Other secondary thrombocytopenia: Secondary | ICD-10-CM | POA: Diagnosis not present

## 2024-01-16 DIAGNOSIS — Z902 Acquired absence of lung [part of]: Secondary | ICD-10-CM | POA: Diagnosis not present

## 2024-01-16 DIAGNOSIS — U071 COVID-19: Secondary | ICD-10-CM | POA: Diagnosis not present

## 2024-01-16 DIAGNOSIS — E785 Hyperlipidemia, unspecified: Secondary | ICD-10-CM | POA: Diagnosis not present

## 2024-01-16 DIAGNOSIS — I10 Essential (primary) hypertension: Secondary | ICD-10-CM | POA: Diagnosis not present

## 2024-01-16 DIAGNOSIS — Z951 Presence of aortocoronary bypass graft: Secondary | ICD-10-CM | POA: Diagnosis not present

## 2024-01-16 DIAGNOSIS — R600 Localized edema: Secondary | ICD-10-CM | POA: Diagnosis not present

## 2024-01-16 DIAGNOSIS — N179 Acute kidney failure, unspecified: Secondary | ICD-10-CM | POA: Diagnosis not present

## 2024-01-16 DIAGNOSIS — M79605 Pain in left leg: Secondary | ICD-10-CM | POA: Diagnosis not present

## 2024-01-16 DIAGNOSIS — Z923 Personal history of irradiation: Secondary | ICD-10-CM | POA: Diagnosis not present

## 2024-01-16 DIAGNOSIS — Z556 Problems related to health literacy: Secondary | ICD-10-CM | POA: Diagnosis not present

## 2024-01-16 DIAGNOSIS — M47892 Other spondylosis, cervical region: Secondary | ICD-10-CM | POA: Diagnosis not present

## 2024-01-16 DIAGNOSIS — Z95828 Presence of other vascular implants and grafts: Secondary | ICD-10-CM | POA: Diagnosis not present

## 2024-01-16 DIAGNOSIS — R531 Weakness: Secondary | ICD-10-CM | POA: Diagnosis not present

## 2024-01-16 DIAGNOSIS — G308 Other Alzheimer's disease: Secondary | ICD-10-CM | POA: Diagnosis not present

## 2024-01-16 DIAGNOSIS — Z9221 Personal history of antineoplastic chemotherapy: Secondary | ICD-10-CM | POA: Diagnosis not present

## 2024-01-16 DIAGNOSIS — Z85118 Personal history of other malignant neoplasm of bronchus and lung: Secondary | ICD-10-CM | POA: Diagnosis not present

## 2024-01-21 ENCOUNTER — Other Ambulatory Visit: Payer: Self-pay | Admitting: Physician Assistant

## 2024-01-30 DIAGNOSIS — E7800 Pure hypercholesterolemia, unspecified: Secondary | ICD-10-CM | POA: Diagnosis not present

## 2024-01-30 DIAGNOSIS — I959 Hypotension, unspecified: Secondary | ICD-10-CM | POA: Diagnosis not present

## 2024-01-30 DIAGNOSIS — I10 Essential (primary) hypertension: Secondary | ICD-10-CM | POA: Diagnosis not present

## 2024-02-06 ENCOUNTER — Ambulatory Visit: Payer: Medicare Other | Admitting: Podiatry

## 2024-02-06 ENCOUNTER — Encounter: Payer: Self-pay | Admitting: Podiatry

## 2024-02-06 DIAGNOSIS — M79675 Pain in left toe(s): Secondary | ICD-10-CM

## 2024-02-06 DIAGNOSIS — B351 Tinea unguium: Secondary | ICD-10-CM

## 2024-02-06 DIAGNOSIS — M79674 Pain in right toe(s): Secondary | ICD-10-CM

## 2024-02-06 DIAGNOSIS — I10 Essential (primary) hypertension: Secondary | ICD-10-CM | POA: Diagnosis not present

## 2024-02-06 DIAGNOSIS — I2510 Atherosclerotic heart disease of native coronary artery without angina pectoris: Secondary | ICD-10-CM | POA: Diagnosis not present

## 2024-02-06 NOTE — Progress Notes (Signed)
 This patient presents to the office with his wife for painful second toe right foot.  He says history of wearing a toe cap for corn on second toe right foot.He presents to the office for evaluation and treatment.  General Appearance  Alert, conversant and in no acute stress.  Vascular  Dorsalis pedis and posterior tibial  pulses are palpable  bilaterally.  Capillary return is within normal limits  bilaterally. Temperature is within normal limits  bilaterally.  Neurologic  Senn-Weinstein monofilament wire test within normal limits  bilaterally. Muscle power within normal limits bilaterally.  Nails Thick disfigured discolored nails with subungual debris   second and third toes right foot.toes  No evidence of bacterial infection or drainage bilaterally.  Orthopedic  No limitations of motion  feet .  No crepitus or effusions noted.  No bony pathology or digital deformities noted. Hammer toe second toe right foot.  Skin  normotropic skin with no porokeratosis noted bilaterally.   Clavi second toe right foot.  Clavi due to hammer toe right foot second toe.  ROV 2.  Debride toenail  second toe right foot.  Instructions for use of toe cap was given.    RTC 3 months   Helane Gunther DPM

## 2024-02-19 DIAGNOSIS — Z23 Encounter for immunization: Secondary | ICD-10-CM | POA: Diagnosis not present

## 2024-02-19 DIAGNOSIS — I2510 Atherosclerotic heart disease of native coronary artery without angina pectoris: Secondary | ICD-10-CM | POA: Diagnosis not present

## 2024-02-19 DIAGNOSIS — Z0000 Encounter for general adult medical examination without abnormal findings: Secondary | ICD-10-CM | POA: Diagnosis not present

## 2024-02-19 DIAGNOSIS — I10 Essential (primary) hypertension: Secondary | ICD-10-CM | POA: Diagnosis not present

## 2024-03-01 DIAGNOSIS — H401133 Primary open-angle glaucoma, bilateral, severe stage: Secondary | ICD-10-CM | POA: Diagnosis not present

## 2024-03-01 DIAGNOSIS — H353134 Nonexudative age-related macular degeneration, bilateral, advanced atrophic with subfoveal involvement: Secondary | ICD-10-CM | POA: Diagnosis not present

## 2024-03-10 DIAGNOSIS — C4442 Squamous cell carcinoma of skin of scalp and neck: Secondary | ICD-10-CM | POA: Diagnosis not present

## 2024-03-10 DIAGNOSIS — D485 Neoplasm of uncertain behavior of skin: Secondary | ICD-10-CM | POA: Diagnosis not present

## 2024-03-24 DIAGNOSIS — D492 Neoplasm of unspecified behavior of bone, soft tissue, and skin: Secondary | ICD-10-CM | POA: Diagnosis not present

## 2024-03-24 DIAGNOSIS — Z85820 Personal history of malignant melanoma of skin: Secondary | ICD-10-CM | POA: Diagnosis not present

## 2024-03-24 DIAGNOSIS — L821 Other seborrheic keratosis: Secondary | ICD-10-CM | POA: Diagnosis not present

## 2024-03-24 DIAGNOSIS — D225 Melanocytic nevi of trunk: Secondary | ICD-10-CM | POA: Diagnosis not present

## 2024-03-24 DIAGNOSIS — L570 Actinic keratosis: Secondary | ICD-10-CM | POA: Diagnosis not present

## 2024-03-24 DIAGNOSIS — L708 Other acne: Secondary | ICD-10-CM | POA: Diagnosis not present

## 2024-03-24 DIAGNOSIS — L814 Other melanin hyperpigmentation: Secondary | ICD-10-CM | POA: Diagnosis not present

## 2024-03-24 DIAGNOSIS — Z85828 Personal history of other malignant neoplasm of skin: Secondary | ICD-10-CM | POA: Diagnosis not present

## 2024-03-24 DIAGNOSIS — D0439 Carcinoma in situ of skin of other parts of face: Secondary | ICD-10-CM | POA: Diagnosis not present

## 2024-03-24 DIAGNOSIS — Z08 Encounter for follow-up examination after completed treatment for malignant neoplasm: Secondary | ICD-10-CM | POA: Diagnosis not present

## 2024-03-24 DIAGNOSIS — C44329 Squamous cell carcinoma of skin of other parts of face: Secondary | ICD-10-CM | POA: Diagnosis not present

## 2024-03-25 ENCOUNTER — Ambulatory Visit: Payer: Medicare Other | Admitting: Physician Assistant

## 2024-03-25 ENCOUNTER — Encounter: Payer: Self-pay | Admitting: Physician Assistant

## 2024-03-25 VITALS — BP 117/58 | HR 78 | Resp 20 | Ht 67.0 in

## 2024-03-25 DIAGNOSIS — F0280 Dementia in other diseases classified elsewhere without behavioral disturbance: Secondary | ICD-10-CM | POA: Diagnosis not present

## 2024-03-25 DIAGNOSIS — G309 Alzheimer's disease, unspecified: Secondary | ICD-10-CM

## 2024-03-25 DIAGNOSIS — F0150 Vascular dementia without behavioral disturbance: Secondary | ICD-10-CM | POA: Diagnosis not present

## 2024-03-25 NOTE — Progress Notes (Signed)
 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 RH male with a history of  dementia likely due to Alzheimer's and vascular disease seen today in follow up for memory loss. Patient is currently on memantine  10 mg twice daily and donepezil  5 mg daily (unable to increase donepezil  due to bradycardia).  Unfortunately, cognitive decline is present. Mood is good. Discussed with his wife that, if further decline is noted, may consider discontinuing ACHI given his cardiac history. She agrees with the plan, understanding that at some point neither antidementia medication may be therapeutic.      Follow up in 6  months. Continue donepezil  5 mg daily and memantine  10 mg twice daily, side effects discussed Recommend good control of her cardiovascular risk factors Continue to control mood as per PCP     Subjective:    This patient is accompanied in the office by his wife  who supplements the history.  Previous records as well as any outside records available were reviewed prior to todays visit. Patient was last seen on 09/18/2023 with  [facility] blind of 6/22    Any changes in memory since last visit? " Was with Covid in Dec 2024 and we do not know if that had an effect on the memory but his memory is worse"-she says.  He has more difficulty remembering conversations, new information, names.  He still likes to watch TV shows especially  Westerns.  He is not  active anymore. repeats oneself?  Endorsed Disoriented when walking into a room?  Sometimes he does not remember where he is  leaving objects?  May misplace things but not in unusual places   Wandering behavior?  denies.  He cannot walk outside alone due to decreased vision Any personality changes since last visit?  denies   Any worsening depression?:  Denies.   Hallucinations or paranoia?  Denies.   Seizures? denies    Any sleep changes?  Sleeps well. Denies vivid dreams,  REM behavior or sleepwalking   Sleep apnea?   Denies.   Any hygiene concerns? Denies.  Independent of bathing and dressing?  Endorsed  Does the patient needs help with medications?  Wife is in charge   Who is in charge of the finances?  Wife is in charge     Any changes in appetite?  denies     Patient have trouble swallowing? Denies.   Does the patient cook? No Any headaches?   denies   Chronic back pain  denies   Ambulates with difficulty?  Wife assists him, uses a walker, or a wheelchair if going out. he did recently PT for strength and balance due to deconditioning with  good results.    Recent falls or head injuries? denies     Unilateral weakness, numbness or tingling? denies   Any tremors?  Denies   Any anosmia?  Denies   Any incontinence of urine?  Endorsed, "he may need a changing underwear sometimes ". Any bowel dysfunction?   Denies      Patient lives with his wife  Does the patient drive? No longer drives       Initial visit 09/26/21 The patient is seen in neurologic consultation at the request of Renda Carpen, MD for the evaluation of memory.  The patient is accompanied by his wife who supplements the history. This is a 83 y.o. year old male who has had memory issues  for about 3-1/2 years, worse over the last 2 years, especially during the COVID pandemic.  He repeats the same questions, sometimes he "does not know where he is ".  He also repeats the same sentences.  He lives with his wife, who noticed these changes as well.  His mood is good, denies depression or irritability.  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  He "reads very little "because he is eyesight is very poor.  He has issues with insomnia, sleeping 1 hour at the time, "gets up and goes to the bathroom, and a good night he will wake up once ".  He sleeps about 6 hours.  He denies any vivid dreams, or sleepwalking.  He denies any hallucinations or paranoia.   He denies leaving objects in unusual places.  He is independent of bathing and dressing.  Wife administers the medication and manages the finances.  His appetite is "okay", eating 3 meals a day, denies trouble swallowing.  He does not cook, but can make a sandwich. Ambulates  without a walker, unless needed, denies any falls or head injuries.  He stopped driving in the [address] due to eyesight difficulties.  He denies any headaches, dizziness, focal numbness or tingling, unilateral weakness or tremors.  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  No history of seizures.  Denies constipation or diarrhea.  He has a history of urine incontinence.  He denies a history of sleep apnea, alcohol or tobacco.  Family history remarkable for aunt with dementia of unknown type.     MRI brain wo contrast 09/01/21  1. No acute intracranial abnormality.2. Progressive, moderate cerebral atrophy.3. Small chronic left parietal infarct.  B12 and TSH at the PCPs office were normal PREVIOUS MEDICATIONS:   CURRENT MEDICATIONS:  Outpatient Encounter Medications as of 03/25/2024  Medication Sig   amLODipine (NORVASC) 10 MG tablet Take 10 mg by mouth daily.   donepezil  (ARICEPT ) 5 MG tablet Take 1 tablet (5 mg total) by mouth at bedtime.   dorzolamide (TRUSOPT) 2 % ophthalmic solution Place 1 drop into both eyes 2 (two) times daily as needed.   dorzolamide-timolol (COSOPT) 22.3-6.8 MG/ML ophthalmic solution Place 1 drop into the left eye at bedtime.   doxazosin (CARDURA) 8 MG tablet Take 8 mg by mouth at bedtime.     lisinopril (PRINIVIL,ZESTRIL) 40 MG tablet Take 40 mg by mouth 2 (two) times daily.   LUTEIN PO Take by mouth 2 (two) times daily.   Melatonin 1 MG CAPS Take by mouth at bedtime.   memantine  (NAMENDA ) 10 MG tablet TAKE 1 TABLET(10 MG) BY MOUTH TWICE DAILY   metoprolol (LOPRESSOR) 50 MG tablet Take 50 mg by mouth 2 (two) times daily.     simvastatin (ZOCOR) 20 MG tablet Take 20 mg by mouth every  evening.   Ascorbic Acid (VITAMIN C) 500 MG tablet Take 500 mg by mouth daily. (Patient not taking: Reported on 03/25/2024)   B Complex Vitamins (B COMPLEX 100 PO) Take by mouth daily. (Patient not taking: Reported on 03/25/2024)   beta carotene 25000 UNIT capsule Take 25,000 Units by mouth daily. (Patient not taking: Reported on 03/25/2024)   Bilberry 500 MG CAPS Take 500 mg by mouth daily. (Patient not taking: Reported on 03/25/2024)   bimatoprost (LUMIGAN) 0.03 % ophthalmic drops Place 1 drop into both eyes at bedtime. (Patient not taking: Reported on 03/25/2024)   Calcium-Magnesium-Zinc (CAL-MAG-ZINC PO) Take by mouth  as directed. (Patient not taking: Reported on 03/25/2024)   Chelated Zinc 50 MG TABS Take by mouth daily. (Patient not taking: Reported on 03/25/2024)   Cinnamon 500 MG capsule Take 1,000 mg by mouth daily. (Patient not taking: Reported on 03/25/2024)   Coenzyme Q10 (CO Q 10) 100 MG CAPS Take by mouth daily. (Patient not taking: Reported on 03/25/2024)   fish oil-omega-3 fatty acids 1000 MG capsule Take 2 g by mouth 3 (three) times daily. (Patient not taking: Reported on 03/25/2024)   folic acid (FOLVITE) 1 MG tablet Take 1 mg by mouth daily. (Patient not taking: Reported on 03/25/2024)   Garlic 1000 MG CAPS Take by mouth daily. (Patient not taking: Reported on 03/25/2024)   Ginkgo Biloba 40 MG TABS Take by mouth daily. (Patient not taking: Reported on 03/25/2024)   Multiple Vitamin (MULTIVITAMIN) tablet Take 1 tablet by mouth daily. (Patient not taking: Reported on 03/25/2024)   potassium chloride SA (K-DUR,KLOR-CON) 20 MEQ tablet  (Patient not taking: Reported on 03/25/2024)   torsemide (DEMADEX) 20 MG tablet 20 mg daily.  (Patient not taking: Reported on 03/25/2024)   No facility-administered encounter medications on file as of 03/25/2024.        No data to display            09/18/2023   12:00 PM 03/19/2023   11:00 AM 09/23/2022    1:00 PM 09/17/2022   11:00 AM 09/17/2021   10:00  AM  Montreal Cognitive Assessment   Attention: Read list of digits (0/2) 2 2 2 2 2   Attention: Read list of letters (0/1) 1 1 1 1 1   Attention: Serial 7 subtraction starting at 100 (0/3) 2 2 3 3 3   Language: Repeat phrase (0/2) 1 0 1 1 1   Language : Fluency (0/1) 0 0 0 0 1  Abstraction (0/2) 0 0 1 1 2   Delayed Recall (0/5) 0 2 0 0 0  Orientation (0/6) 0 1 1 1 2     Objective:     PHYSICAL EXAMINATION:    VITALS:   Vitals:   03/25/24 1240  BP: (!) 117/58  Pulse: 78  Resp: 20  SpO2: 98%  Height: 5\' 7"  (1.702 m)    GEN:  The patient appears stated age and is in NAD. HEENT:  Normocephalic, multiple areas on skin f melanoma resection, especially on L forehead  Neurological examination:  General: NAD, well-groomed, appears stated age. Orientation: The patient is awake, less alert than prior.  Not oriented to person,  place and date Cranial nerves: There is good facial symmetry.The speech is not fluent but clear. No aphasia or dysarthria. Fund of knowledge is reduced. Recent and remote memory are impaired. Attention and concentration are reduced.  Able to name objects to poor vision and unable to repeat phrases.  Hearing is decreased to conversational tone.   Sensation: Sensation is intact to light touch throughout Motor: Strength is at least antigravity x4. DTR's 1/4 in UE/LE     Movement examination: Tone: There is normal tone in the UE/LE Abnormal movements:  no tremor.  No myoclonus.  No asterixis.   Coordination:  There is no decremation with RAM's. Normal finger to nose  Gait and Station: The patient has difficulty arising out of a deep-seated chair without the use of the hands. Unable to test gait and stride.    Thank you for allowing us  the opportunity to participate in the care of this nice patient. Please do not hesitate to contact us   for any questions or concerns.   Total time spent on today's visit was 28 minutes dedicated to this patient today, preparing to see  patient, examining the patient, ordering tests and/or medications and counseling the patient, documenting clinical information in the EHR or other health record, independently interpreting results and communicating results to the patient/family, discussing treatment and goals, answering patient's questions and coordinating care.  Cc:  Ransom Byers, MD  Tex Filbert 03/25/2024 5:19 PM

## 2024-03-25 NOTE — Patient Instructions (Addendum)
 It was a pleasure to see you today at our office.   Recommendations:  Continue Memantine  10 mg tablets 1 tablet twice daily.  Continue donepezil   5 mg nightly and memantine  10 mg twice a day. If memory continues to decline, donepezil  may be discontinued Follow up in 6 months   RECOMMENDATIONS FOR ALL PATIENTS WITH MEMORY PROBLEMS: 1. Continue to exercise (Recommend 30 minutes of walking everyday, or 3 hours every week) 2. Increase social interactions - continue going to [place] and enjoy social gatherings with friends and family 3. Eat healthy, avoid fried foods and eat more fruits and vegetables 4. Maintain adequate blood pressure, blood sugar, and blood cholesterol level. Reducing the risk of stroke and cardiovascular disease also helps promoting better memory. 5. Avoid stressful situations. Live a simple life and avoid aggravations. Organize your time and prepare for the next day in anticipation. 6. Sleep well, avoid any interruptions of sleep and avoid any distractions in the bedroom that may interfere with adequate sleep quality 7. Avoid sugar, avoid sweets as there is a strong link between excessive sugar intake, diabetes, and cognitive impairment We discussed the Mediterranean diet, which has been shown to help patients reduce the risk of progressive memory disorders and reduces cardiovascular risk. This includes eating fish, eat fruits and green leafy vegetables, nuts like almonds and hazelnuts, walnuts, and also use olive oil. Avoid fast foods and fried foods as much as possible. Avoid sweets and sugar as sugar use has been linked to worsening of memory function.  There is always a concern of gradual progression of memory problems. If this is the case, then we may need to adjust level of care according to patient needs. Support, both to the patient and caregiver, should then be put into place.    FALL PRECAUTIONS: Be cautious when walking. Scan the area for obstacles that may increase  the risk of trips and falls. When getting up in the mornings, sit up at the edge of the bed for a few minutes before getting out of bed. Consider elevating the bed at the head end to avoid drop of blood pressure when getting up. Walk always in a well-lit room (use night lights in the walls). Avoid area rugs or power cords from appliances in the middle of the walkways. Use a walker or a cane if necessary and consider physical therapy for balance exercise. Get your eyesight checked regularly.     HOME SAFETY: Consider the safety of the kitchen when operating appliances like stoves, microwave oven, and blender. Consider having supervision and share cooking responsibilities until no longer able to participate in those. Accidents with firearms and other hazards in the house should be identified and addressed as well.   ABILITY TO BE LEFT ALONE: If patient is unable to contact 911 operator, consider using LifeLine, or when the need is there, arrange for someone to stay with patients. Smoking is a fire hazard, consider supervision or cessation. Risk of wandering should be assessed by caregiver and if detected at any point, supervision and safe proof recommendations should be instituted.  MEDICATION SUPERVISION: Inability to self-administer medication needs to be constantly addressed. Implement a mechanism to ensure safe administration of the medications.       Mediterranean Diet A Mediterranean diet refers to food and lifestyle choices that are based on the traditions of countries located on the [employer] [place]. This way of eating has been shown to help prevent certain conditions and improve outcomes for people who have chronic  diseases, like kidney disease and heart disease. What are tips for following this plan? Lifestyle  Cook and eat meals together with your family, when possible. Drink enough fluid to keep your urine clear or pale yellow. Be physically active every day. This includes: Aerobic  exercise like running or swimming. Leisure activities like gardening, walking, or housework. Get 7-8 hours of sleep each night. If recommended by your health care provider, drink red wine in moderation. This means 1 glass a day for nonpregnant women and 2 glasses a day for men. A glass of wine equals 5 oz (150 mL). Reading food labels  Check the serving size of packaged foods. For foods such as rice and pasta, the serving size refers to the amount of cooked product, not dry. Check the total fat in packaged foods. Avoid foods that have saturated fat or trans fats. Check the ingredients list for added sugars, such as corn syrup. Shopping  At the grocery store, buy most of your food from the areas near the walls of the store. This includes: Fresh fruits and vegetables (produce). Grains, beans, nuts, and seeds. Some of these may be available in unpackaged forms or large amounts (in bulk). Fresh seafood. Poultry and eggs. Low-fat dairy products. Buy whole ingredients instead of prepackaged foods. Buy fresh fruits and vegetables in-season from local farmers markets. Buy frozen fruits and vegetables in resealable bags. If you do not have access to quality fresh seafood, buy precooked frozen shrimp or canned fish, such as tuna, salmon, or sardines. Buy small amounts of raw or cooked vegetables, salads, or olives from the deli or salad bar at your store. Stock your pantry so you always have certain foods on hand, such as olive oil, canned tuna, canned tomatoes, rice, pasta, and beans. Cooking  Cook foods with extra-virgin olive oil instead of using butter or other vegetable oils. Have meat as a side dish, and have vegetables or grains as your main dish. This means having meat in small portions or adding small amounts of meat to foods like pasta or stew. Use beans or vegetables instead of meat in common dishes like chili or lasagna. Experiment with different cooking methods. Try roasting or broiling  vegetables instead of steaming or sauteing them. Add frozen vegetables to soups, stews, pasta, or rice. Add nuts or seeds for added healthy fat at each meal. You can add these to yogurt, salads, or vegetable dishes. Marinate fish or vegetables using olive oil, lemon juice, garlic, and fresh herbs. Meal planning  Plan to eat 1 vegetarian meal one day each week. Try to work up to 2 vegetarian meals, if possible. Eat seafood 2 or more times a week. Have healthy snacks readily available, such as: Vegetable sticks with hummus. Greek yogurt. Fruit and nut trail mix. Eat balanced meals throughout the week. This includes: Fruit: 2-3 servings a day Vegetables: 4-5 servings a day Low-fat dairy: 2 servings a day Fish, poultry, or lean meat: 1 serving a day Beans and legumes: 2 or more servings a week Nuts and seeds: 1-2 servings a day Whole grains: 6-8 servings a day Extra-virgin olive oil: 3-4 servings a day Limit red meat and sweets to only a few servings a month What are my food choices? Mediterranean diet Recommended Grains: Whole-grain pasta. Brown rice. Bulgar wheat. Polenta. Couscous. Whole-wheat bread. Dwyane Glad. Vegetables: Artichokes. Beets. Broccoli. Cabbage. Carrots. Eggplant. Green beans. Chard. Kale. Spinach. Onions. Leeks. Peas. Squash. Tomatoes. Peppers. Radishes. Fruits: Apples. Apricots. Avocado. Berries. Bananas. Cherries. Dates. Figs.  Grapes. Lemons. Melon. Oranges. Peaches. Plums. Pomegranate. Meats and other protein foods: Beans. Almonds. Sunflower seeds. Pine nuts. Peanuts. Cod. Salmon. Scallops. Shrimp. Tuna. Tilapia. Clams. Oysters. Eggs. Dairy: Low-fat milk. Cheese. Greek yogurt. Beverages: Water. Red wine. Herbal tea. Fats and oils: Extra virgin olive oil. Avocado oil. Grape seed oil. Sweets and desserts: [place] yogurt with honey. Baked apples. Poached pears. Trail mix. Seasoning and other foods: Basil. Cilantro. Coriander. Cumin. Mint. Parsley. Sage. Rosemary.  Tarragon. Garlic. Oregano. Thyme. Pepper. Balsalmic vinegar. Tahini. Hummus. Tomato sauce. Olives. Mushrooms. Limit these Grains: Prepackaged pasta or rice dishes. Prepackaged cereal with added sugar. Vegetables: Deep fried potatoes (french fries). Fruits: Fruit canned in syrup. Meats and other protein foods: Beef. Pork. Lamb. Poultry with skin. Hot dogs. Helene Loader. Dairy: Ice cream. Sour cream. Whole milk. Beverages: Juice. Sugar-sweetened soft drinks. Beer. Liquor and spirits. Fats and oils: Butter. Canola oil. Vegetable oil. Beef fat (tallow). Lard. Sweets and desserts: Cookies. Cakes. Pies. Candy. Seasoning and other foods: Mayonnaise. Premade sauces and marinades. The items listed may not be a complete list. Talk with your dietitian about what dietary choices are right for you. Summary The Mediterranean diet includes both food and lifestyle choices. Eat a variety of fresh fruits and vegetables, beans, nuts, seeds, and whole grains. Limit the amount of red meat and sweets that you eat. Talk with your health care provider about whether it is safe for you to drink red wine in moderation. This means 1 glass a day for nonpregnant women and 2 glasses a day for men. A glass of wine equals 5 oz (150 mL). This information is not intended to replace advice given to you by your health care provider. Make sure you discuss any questions you have with your health care provider. Document Released: 07/11/2016 Document Revised: 08/13/2016 Document Reviewed: 07/11/2016 Elsevier Interactive Patient Education  2017 [employer].    Continue

## 2024-04-05 DIAGNOSIS — I10 Essential (primary) hypertension: Secondary | ICD-10-CM | POA: Diagnosis not present

## 2024-04-07 DIAGNOSIS — C44329 Squamous cell carcinoma of skin of other parts of face: Secondary | ICD-10-CM | POA: Diagnosis not present

## 2024-04-18 ENCOUNTER — Other Ambulatory Visit: Payer: Self-pay | Admitting: Physician Assistant

## 2024-04-19 DIAGNOSIS — D0439 Carcinoma in situ of skin of other parts of face: Secondary | ICD-10-CM | POA: Diagnosis not present

## 2024-05-10 ENCOUNTER — Encounter: Payer: Self-pay | Admitting: Podiatry

## 2024-05-10 ENCOUNTER — Ambulatory Visit: Admitting: Podiatry

## 2024-05-10 DIAGNOSIS — B351 Tinea unguium: Secondary | ICD-10-CM

## 2024-05-10 DIAGNOSIS — M79674 Pain in right toe(s): Secondary | ICD-10-CM | POA: Diagnosis not present

## 2024-05-10 DIAGNOSIS — M79675 Pain in left toe(s): Secondary | ICD-10-CM

## 2024-05-10 NOTE — Progress Notes (Signed)
 This patient presents to the office with his wife for long thick painful nails..  He says the nails are painful walking and wearing his shoes.  He presents to the office for evaluation and treatment.  General Appearance  Alert, conversant and in no acute stress.  Vascular  Dorsalis pedis and posterior tibial  pulses are  weakly palpable  bilaterally.  Capillary return is within normal limits  bilaterally. Temperature is within normal limits  bilaterally.  Neurologic  Senn-Weinstein monofilament wire test within normal limits  bilaterally. Muscle power within normal limits bilaterally.  Nails Thick disfigured discolored nails with subungual debris   second and third toes right foot.toes  No evidence of bacterial infection or drainage bilaterally.  Orthopedic  No limitations of motion  feet .  No crepitus or effusions noted.  No bony pathology or digital deformities noted. Hammer toe second toe   Skin  normotropic skin with no porokeratosis noted bilaterally.     Onychomycosis  ROV 2.  Debride toenail  second toe right foot.  Instructions for use of toe cap was given.    RTC 3 months   Ruffin Cotton DPM

## 2024-05-30 ENCOUNTER — Emergency Department (HOSPITAL_COMMUNITY)

## 2024-05-30 ENCOUNTER — Inpatient Hospital Stay (HOSPITAL_COMMUNITY)
Admission: EM | Admit: 2024-05-30 | Discharge: 2024-06-04 | DRG: 146 | Disposition: A | Discharge status: Hospice | Attending: Family Medicine | Admitting: Family Medicine

## 2024-05-30 ENCOUNTER — Encounter (HOSPITAL_COMMUNITY): Payer: Self-pay

## 2024-05-30 DIAGNOSIS — R627 Adult failure to thrive: Secondary | ICD-10-CM | POA: Diagnosis present

## 2024-05-30 DIAGNOSIS — C320 Malignant neoplasm of glottis: Secondary | ICD-10-CM | POA: Diagnosis present

## 2024-05-30 DIAGNOSIS — Z85820 Personal history of malignant melanoma of skin: Secondary | ICD-10-CM

## 2024-05-30 DIAGNOSIS — J90 Pleural effusion, not elsewhere classified: Secondary | ICD-10-CM | POA: Diagnosis not present

## 2024-05-30 DIAGNOSIS — I10 Essential (primary) hypertension: Secondary | ICD-10-CM | POA: Diagnosis not present

## 2024-05-30 DIAGNOSIS — C7802 Secondary malignant neoplasm of left lung: Secondary | ICD-10-CM | POA: Diagnosis not present

## 2024-05-30 DIAGNOSIS — F0150 Vascular dementia without behavioral disturbance: Secondary | ICD-10-CM | POA: Diagnosis not present

## 2024-05-30 DIAGNOSIS — E870 Hyperosmolality and hypernatremia: Secondary | ICD-10-CM | POA: Diagnosis present

## 2024-05-30 DIAGNOSIS — C7971 Secondary malignant neoplasm of right adrenal gland: Secondary | ICD-10-CM | POA: Diagnosis not present

## 2024-05-30 DIAGNOSIS — I6522 Occlusion and stenosis of left carotid artery: Secondary | ICD-10-CM | POA: Diagnosis not present

## 2024-05-30 DIAGNOSIS — K8020 Calculus of gallbladder without cholecystitis without obstruction: Secondary | ICD-10-CM | POA: Diagnosis not present

## 2024-05-30 DIAGNOSIS — C7951 Secondary malignant neoplasm of bone: Secondary | ICD-10-CM | POA: Diagnosis present

## 2024-05-30 DIAGNOSIS — Z66 Do not resuscitate: Secondary | ICD-10-CM | POA: Diagnosis present

## 2024-05-30 DIAGNOSIS — R112 Nausea with vomiting, unspecified: Secondary | ICD-10-CM | POA: Diagnosis not present

## 2024-05-30 DIAGNOSIS — F0280 Dementia in other diseases classified elsewhere without behavioral disturbance: Secondary | ICD-10-CM | POA: Diagnosis not present

## 2024-05-30 DIAGNOSIS — C7801 Secondary malignant neoplasm of right lung: Secondary | ICD-10-CM | POA: Diagnosis present

## 2024-05-30 DIAGNOSIS — I6782 Cerebral ischemia: Secondary | ICD-10-CM | POA: Diagnosis not present

## 2024-05-30 DIAGNOSIS — E785 Hyperlipidemia, unspecified: Secondary | ICD-10-CM | POA: Diagnosis present

## 2024-05-30 DIAGNOSIS — R0789 Other chest pain: Secondary | ICD-10-CM | POA: Diagnosis not present

## 2024-05-30 DIAGNOSIS — N400 Enlarged prostate without lower urinary tract symptoms: Secondary | ICD-10-CM | POA: Diagnosis present

## 2024-05-30 DIAGNOSIS — I472 Ventricular tachycardia, unspecified: Secondary | ICD-10-CM | POA: Diagnosis not present

## 2024-05-30 DIAGNOSIS — E860 Dehydration: Secondary | ICD-10-CM | POA: Diagnosis not present

## 2024-05-30 DIAGNOSIS — Z79899 Other long term (current) drug therapy: Secondary | ICD-10-CM

## 2024-05-30 DIAGNOSIS — I672 Cerebral atherosclerosis: Secondary | ICD-10-CM | POA: Diagnosis not present

## 2024-05-30 DIAGNOSIS — Z604 Social exclusion and rejection: Secondary | ICD-10-CM | POA: Diagnosis present

## 2024-05-30 DIAGNOSIS — C7800 Secondary malignant neoplasm of unspecified lung: Secondary | ICD-10-CM | POA: Diagnosis not present

## 2024-05-30 DIAGNOSIS — Z9221 Personal history of antineoplastic chemotherapy: Secondary | ICD-10-CM

## 2024-05-30 DIAGNOSIS — I723 Aneurysm of iliac artery: Secondary | ICD-10-CM | POA: Diagnosis not present

## 2024-05-30 DIAGNOSIS — Z881 Allergy status to other antibiotic agents status: Secondary | ICD-10-CM

## 2024-05-30 DIAGNOSIS — I2510 Atherosclerotic heart disease of native coronary artery without angina pectoris: Secondary | ICD-10-CM | POA: Diagnosis not present

## 2024-05-30 DIAGNOSIS — Z823 Family history of stroke: Secondary | ICD-10-CM

## 2024-05-30 DIAGNOSIS — C139 Malignant neoplasm of hypopharynx, unspecified: Secondary | ICD-10-CM | POA: Diagnosis not present

## 2024-05-30 DIAGNOSIS — R1310 Dysphagia, unspecified: Secondary | ICD-10-CM | POA: Diagnosis not present

## 2024-05-30 DIAGNOSIS — R531 Weakness: Secondary | ICD-10-CM | POA: Diagnosis not present

## 2024-05-30 DIAGNOSIS — J189 Pneumonia, unspecified organism: Principal | ICD-10-CM | POA: Diagnosis present

## 2024-05-30 DIAGNOSIS — Z85118 Personal history of other malignant neoplasm of bronchus and lung: Secondary | ICD-10-CM

## 2024-05-30 DIAGNOSIS — Z923 Personal history of irradiation: Secondary | ICD-10-CM

## 2024-05-30 DIAGNOSIS — S0990XA Unspecified injury of head, initial encounter: Secondary | ICD-10-CM | POA: Diagnosis not present

## 2024-05-30 DIAGNOSIS — C799 Secondary malignant neoplasm of unspecified site: Secondary | ICD-10-CM

## 2024-05-30 DIAGNOSIS — Z87891 Personal history of nicotine dependence: Secondary | ICD-10-CM | POA: Diagnosis not present

## 2024-05-30 DIAGNOSIS — N179 Acute kidney failure, unspecified: Secondary | ICD-10-CM | POA: Diagnosis present

## 2024-05-30 DIAGNOSIS — G309 Alzheimer's disease, unspecified: Secondary | ICD-10-CM | POA: Diagnosis present

## 2024-05-30 DIAGNOSIS — R296 Repeated falls: Secondary | ICD-10-CM | POA: Diagnosis present

## 2024-05-30 DIAGNOSIS — E876 Hypokalemia: Secondary | ICD-10-CM | POA: Diagnosis present

## 2024-05-30 DIAGNOSIS — E042 Nontoxic multinodular goiter: Secondary | ICD-10-CM | POA: Diagnosis not present

## 2024-05-30 DIAGNOSIS — M799 Soft tissue disorder, unspecified: Secondary | ICD-10-CM | POA: Diagnosis not present

## 2024-05-30 DIAGNOSIS — R079 Chest pain, unspecified: Secondary | ICD-10-CM | POA: Diagnosis not present

## 2024-05-30 DIAGNOSIS — I517 Cardiomegaly: Secondary | ICD-10-CM | POA: Diagnosis not present

## 2024-05-30 DIAGNOSIS — R918 Other nonspecific abnormal finding of lung field: Secondary | ICD-10-CM | POA: Diagnosis not present

## 2024-05-30 DIAGNOSIS — Z951 Presence of aortocoronary bypass graft: Secondary | ICD-10-CM

## 2024-05-30 DIAGNOSIS — R599 Enlarged lymph nodes, unspecified: Secondary | ICD-10-CM | POA: Diagnosis not present

## 2024-05-30 DIAGNOSIS — S199XXA Unspecified injury of neck, initial encounter: Secondary | ICD-10-CM | POA: Diagnosis not present

## 2024-05-30 LAB — COMPREHENSIVE METABOLIC PANEL WITH GFR
ALT: 15 U/L (ref 0–44)
AST: 27 U/L (ref 15–41)
Albumin: 3.2 g/dL — ABNORMAL LOW (ref 3.5–5.0)
Alkaline Phosphatase: 79 U/L (ref 38–126)
Anion gap: 11 (ref 5–15)
BUN: 39 mg/dL — ABNORMAL HIGH (ref 8–23)
CO2: 24 mmol/L (ref 22–32)
Calcium: 9 mg/dL (ref 8.9–10.3)
Chloride: 111 mmol/L (ref 98–111)
Creatinine, Ser: 1.73 mg/dL — ABNORMAL HIGH (ref 0.61–1.24)
GFR, Estimated: 38 mL/min — ABNORMAL LOW (ref >60)
Glucose, Bld: 92 mg/dL (ref 70–99)
Potassium: 3.7 mmol/L (ref 3.5–5.1)
Sodium: 146 mmol/L — ABNORMAL HIGH (ref 135–145)
Total Bilirubin: 0.5 mg/dL (ref 0.0–1.2)
Total Protein: 7.1 g/dL (ref 6.5–8.1)

## 2024-05-30 LAB — URINALYSIS, ROUTINE W REFLEX MICROSCOPIC
Bilirubin Urine: NEGATIVE
Glucose, UA: NEGATIVE mg/dL
Hgb urine dipstick: NEGATIVE
Ketones, ur: NEGATIVE mg/dL
Leukocytes,Ua: NEGATIVE
Nitrite: NEGATIVE
Protein, ur: NEGATIVE mg/dL
Specific Gravity, Urine: 1.012 (ref 1.005–1.030)
pH: 5 (ref 5.0–8.0)

## 2024-05-30 LAB — I-STAT VENOUS BLOOD GAS, ED
Acid-Base Excess: 0 mmol/L (ref 0.0–2.0)
Bicarbonate: 25.2 mmol/L (ref 20.0–28.0)
Calcium, Ion: 1.16 mmol/L (ref 1.15–1.40)
HCT: 33 % — ABNORMAL LOW (ref 39.0–52.0)
Hemoglobin: 11.2 g/dL — ABNORMAL LOW (ref 13.0–17.0)
O2 Saturation: 99 %
Potassium: 3.6 mmol/L (ref 3.5–5.1)
Sodium: 147 mmol/L — ABNORMAL HIGH (ref 135–145)
TCO2: 26 mmol/L (ref 22–32)
pCO2, Ven: 40.7 mmHg — ABNORMAL LOW (ref 44–60)
pH, Ven: 7.401 (ref 7.25–7.43)
pO2, Ven: 156 mmHg — ABNORMAL HIGH (ref 32–45)

## 2024-05-30 LAB — CBC
HCT: 35.3 % — ABNORMAL LOW (ref 39.0–52.0)
Hemoglobin: 10.7 g/dL — ABNORMAL LOW (ref 13.0–17.0)
MCH: 29.4 pg (ref 26.0–34.0)
MCHC: 30.3 g/dL (ref 30.0–36.0)
MCV: 97 fL (ref 80.0–100.0)
Platelets: 150 10*3/uL (ref 150–400)
RBC: 3.64 MIL/uL — ABNORMAL LOW (ref 4.22–5.81)
RDW: 13 % (ref 11.5–15.5)
WBC: 9.9 10*3/uL (ref 4.0–10.5)
nRBC: 0 % (ref 0.0–0.2)

## 2024-05-30 LAB — I-STAT CHEM 8, ED
BUN: 37 mg/dL — ABNORMAL HIGH (ref 8–23)
Calcium, Ion: 1.17 mmol/L (ref 1.15–1.40)
Chloride: 109 mmol/L (ref 98–111)
Creatinine, Ser: 1.8 mg/dL — ABNORMAL HIGH (ref 0.61–1.24)
Glucose, Bld: 95 mg/dL (ref 70–99)
HCT: 34 % — ABNORMAL LOW (ref 39.0–52.0)
Hemoglobin: 11.6 g/dL — ABNORMAL LOW (ref 13.0–17.0)
Potassium: 3.6 mmol/L (ref 3.5–5.1)
Sodium: 146 mmol/L — ABNORMAL HIGH (ref 135–145)
TCO2: 24 mmol/L (ref 22–32)

## 2024-05-30 LAB — TROPONIN I (HIGH SENSITIVITY)
Troponin I (High Sensitivity): 80 ng/L — ABNORMAL HIGH (ref <18)
Troponin I (High Sensitivity): 72 ng/L — ABNORMAL HIGH (ref <18)

## 2024-05-30 LAB — LIPASE, BLOOD: Lipase: 39 U/L (ref 11–51)

## 2024-05-30 LAB — I-STAT CG4 LACTIC ACID, ED: Lactic Acid, Venous: 2 mmol/L (ref 0.5–1.9)

## 2024-05-30 LAB — PROTIME-INR
INR: 1.1 (ref 0.8–1.2)
Prothrombin Time: 14.4 s (ref 11.4–15.2)

## 2024-05-30 MED ORDER — SODIUM CHLORIDE 0.9 % IV BOLUS
500.0000 mL | Freq: Once | INTRAVENOUS | Status: AC
  Administered 2024-05-30: 500 mL via INTRAVENOUS

## 2024-05-30 MED ORDER — SODIUM CHLORIDE 0.9 % IV SOLN
3.0000 g | Freq: Once | INTRAVENOUS | Status: AC
  Administered 2024-05-30: 3 g via INTRAVENOUS
  Filled 2024-05-30: qty 8

## 2024-05-30 NOTE — ED Notes (Signed)
 Wife left bedside to go home and rest. Requested we call with any changes to him.

## 2024-05-30 NOTE — ED Triage Notes (Signed)
 Pt comes from home via [facility] from home for CP that started about an hour ago, pain has resolved now, pt had a fall yesterday,  but refused transport. CP is tender to palpation. Denies SOB or dizziness. PTA received 324 ASA

## 2024-05-30 NOTE — ED Provider Notes (Signed)
 [place] EMERGENCY DEPARTMENT AT [facility] Provider Note   CSN: [id number] Arrival date & time: 05/30/24  2054     Patient presents with: Chest Pain   Rodney Christensen is a 83 y.o. male.  {Add pertinent medical, surgical, social history, OB history to HPI:3923} 83 year old male brought in by EMS from home, wife at bedside and provides history.  Wife states that about 3 weeks ago the patient slid out of bed.  She had to call EMS to help get him back up, he was assessed at that time and felt to be okay.  Wife is noted that he has had a gradual decline since that time.  Yesterday, he had reported being constipated and she gave him a Dulcolax.  He had a bowel movement while in the shower and again getting out of the shower.  Patient then slid down his wife's legs to the floor, did not hit head, no LOC.  Wife again called EMS who helped patient get up and assessed him and felt like he was fine.  Today, patient reported chest heaviness to his wife which prompted her to call 911.  She reports normal p.o. intake which is not a lot of p.o. intake for this patient but unchanged from baseline.  She has felt his breathing has been labored for the past few days and has also noted that his skin is clammy at times.  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  Also history of lung cancer.       Prior to Admission medications   Medication Sig Start Date End Date Taking? Authorizing Provider  amLODipine (NORVASC) 10 MG tablet Take 10 mg by mouth daily.    [provider]  Ascorbic Acid (VITAMIN C) 500 MG tablet Take 500 mg by mouth daily.    [provider]  B Complex Vitamins (B COMPLEX 100 PO) Take by mouth daily.    [provider]  beta carotene 25000 UNIT capsule Take 25,000 Units by mouth daily.    [provider]  Bilberry 500 MG CAPS  Take 500 mg by mouth daily.    [provider]  bimatoprost (LUMIGAN) 0.03 % ophthalmic drops Place 1 drop into both eyes at bedtime.    [provider]  Calcium-Magnesium-Zinc (CAL-MAG-ZINC PO) Take by mouth as directed.    [provider]  Chelated Zinc 50 MG TABS Take by mouth daily.    [provider]  Cinnamon 500 MG capsule Take 1,000 mg by mouth daily.    [provider]  Coenzyme Q10 (CO Q 10) 100 MG CAPS Take by mouth daily.    [provider]  donepezil  (ARICEPT ) 5 MG tablet TAKE 1 TABLET(5 MG) BY MOUTH AT BEDTIME 04/19/24   Wertman, Sara E, PA-C  dorzolamide (TRUSOPT) 2 % ophthalmic solution Place 1 drop into both eyes 2 (two) times daily as needed.    [provider]  dorzolamide-timolol (COSOPT) 22.3-6.8 MG/ML ophthalmic solution Place 1 drop into the left eye at bedtime. 06/17/15   [provider]  doxazosin (CARDURA) 8 MG tablet Take 8 mg by mouth at bedtime.      [provider]  fish oil-omega-3 fatty acids 1000 MG capsule Take 2 g by mouth 3 (three) times daily.    [provider]  folic acid (FOLVITE) 1 MG tablet Take 1 mg by mouth daily.  [provider]  Garlic 1000 MG CAPS Take by mouth daily.    [provider]  Ginkgo Biloba 40 MG TABS Take by mouth daily.    [provider]  lisinopril (PRINIVIL,ZESTRIL) 40 MG tablet Take 40 mg by mouth 2 (two) times daily.    [provider]  LUTEIN PO Take by mouth 2 (two) times daily.    [provider]  Melatonin 1 MG CAPS Take by mouth at bedtime.    [provider]  memantine  (NAMENDA ) 10 MG tablet TAKE 1 TABLET(10 MG) BY MOUTH TWICE DAILY 01/22/24   Wertman, Sara E, PA-C  metoprolol (LOPRESSOR) 50 MG tablet Take 50 mg by mouth 2 (two) times daily.      [provider]  Multiple Vitamin (MULTIVITAMIN) tablet Take 1 tablet by mouth daily.    [provider]  potassium  chloride SA (K-DUR,KLOR-CON) 20 MEQ tablet  02/22/11   [provider]  simvastatin (ZOCOR) 20 MG tablet Take 20 mg by mouth every evening.    [provider]  torsemide (DEMADEX) 20 MG tablet 20 mg daily. 02/22/11   [provider]    Allergies: Cephalexin and Latanoprost    Review of Systems Level 5 caveat for Alzheimer Updated Vital Signs BP (!) 90/56   Pulse 89   Temp 98.4 F (36.9 C) (Oral)   Resp 16   SpO2 96%   Physical Exam Vitals and nursing note reviewed.  Constitutional:      General: He is not in acute distress.    Appearance: He is well-developed. He is not diaphoretic.  HENT:     Head: Normocephalic and atraumatic.   Cardiovascular:     Rate and Rhythm: Normal rate and regular rhythm.     Heart sounds: Normal heart sounds.  Pulmonary:     Effort: Pulmonary effort is normal.     Breath sounds: Normal breath sounds.  Chest:     Chest wall: No tenderness.  Abdominal:     Palpations: Abdomen is soft.     Tenderness: There is generalized abdominal tenderness.   Musculoskeletal:     Cervical back: Neck supple. No bony tenderness.     Thoracic back: No bony tenderness.     Lumbar back: No bony tenderness.     Right lower leg: No tenderness. No edema.     Left lower leg: No tenderness. No edema.   Skin:    General: Skin is warm and dry.     Findings: No erythema or rash.   Neurological:     Mental Status: He is alert and oriented to person, place, and time.   Psychiatric:        Behavior: Behavior normal.     (all labs ordered are listed, but only abnormal results are displayed) Labs Reviewed  CBC  TROPONIN I (HIGH SENSITIVITY)    EKG: None  Radiology: DG Chest 2 View Result Date: 05/30/2024 CLINICAL DATA:  Remote history left lower lobectomy for carcinoma with post treatment changes. Presents with chest pain. Fell yesterday but did not seek medical attention. EXAM: CHEST - 2 VIEW COMPARISON:  Chest CT with contrast  06/20/2020 FINDINGS: The lungs are mildly emphysematous. Some volume loss again noted on the left with left lower lobectomy. There are post treatment changes in the dorsal left mid lung best seen on the lateral. There is increased opacity is suspected in the left base concerning for pneumonia or aspiration. The remaining lungs are clear. No substantial  pleural effusion is seen. Stable mild cardiomegaly with CABG changes. Central vessel markings are normal caliber. Stable mediastinum with aortic atherosclerosis. Osteopenia and bridging enthesopathy thoracic spine with no new osseous findings. IMPRESSION: 1. Increased opacity in the left base concerning for pneumonia or aspiration. 2. Emphysema, left lower lobectomy and post treatment changes 3. Stable mild cardiomegaly with CABG changes. 4. Aortic atherosclerosis. 5. Osteopenia and bridging enthesopathy thoracic spine. Electronically Signed   By: Francis Quam M.D.   On: 05/30/2024 22:11    {Document cardiac monitor, telemetry assessment procedure when appropriate:32947} Procedures   Medications Ordered in the ED - No data to display    {Click here for ABCD2, HEART and other calculators REFRESH Note before signing:1}                              Medical Decision Making Amount and/or Complexity of Data Reviewed Labs: ordered. Radiology: ordered.   This patient presents to the ED for concern of ***, this involves an extensive number of treatment options, and is a complaint that carries with it a high risk of complications and morbidity.  The differential diagnosis includes ***   Co morbidities / Chronic conditions that complicate the patient evaluation  ***   Additional history obtained:  Additional history obtained from EMR External records from outside source obtained and reviewed including ***   Lab Tests:  I Ordered, and personally interpreted labs.  The pertinent results include:  ***   Imaging Studies ordered:  I ordered  imaging studies including ***  I independently visualized and interpreted imaging which showed *** I agree with the radiologist interpretation   Cardiac Monitoring: / EKG:  The patient was maintained on a cardiac monitor.  I personally viewed and interpreted the cardiac monitored which showed an underlying rhythm of: ***   Problem List / ED Course / Critical interventions / Medication management  *** I ordered medication including ***   Reevaluation of the patient after these medicines showed that the patient *** I have reviewed the patients home medicines and have made adjustments as needed   Consultations Obtained:  I requested consultation with the ***,  and discussed lab and imaging findings as well as pertinent plan - they recommend: ***   Social Determinants of Health:  ***   Test / Admission - Considered:  ***   {Document critical care time when appropriate  Document review of labs and clinical decision tools ie CHADS2VASC2, etc  Document your independent review of radiology images and any outside records  Document your discussion with family members, caretakers and with consultants  Document social determinants of health affecting pt's care  Document your decision making why or why not admission, treatments were needed:32947:::1}   Final diagnoses:  None    ED Discharge Orders     None

## 2024-05-31 ENCOUNTER — Encounter (HOSPITAL_COMMUNITY): Payer: Self-pay | Admitting: Family Medicine

## 2024-05-31 DIAGNOSIS — N179 Acute kidney failure, unspecified: Secondary | ICD-10-CM | POA: Diagnosis not present

## 2024-05-31 DIAGNOSIS — Z85118 Personal history of other malignant neoplasm of bronchus and lung: Secondary | ICD-10-CM | POA: Diagnosis not present

## 2024-05-31 DIAGNOSIS — G309 Alzheimer's disease, unspecified: Secondary | ICD-10-CM

## 2024-05-31 DIAGNOSIS — E860 Dehydration: Secondary | ICD-10-CM | POA: Diagnosis present

## 2024-05-31 DIAGNOSIS — R627 Adult failure to thrive: Secondary | ICD-10-CM | POA: Diagnosis present

## 2024-05-31 DIAGNOSIS — I472 Ventricular tachycardia, unspecified: Secondary | ICD-10-CM | POA: Diagnosis present

## 2024-05-31 DIAGNOSIS — C7801 Secondary malignant neoplasm of right lung: Secondary | ICD-10-CM | POA: Diagnosis not present

## 2024-05-31 DIAGNOSIS — Z85820 Personal history of malignant melanoma of skin: Secondary | ICD-10-CM | POA: Diagnosis not present

## 2024-05-31 DIAGNOSIS — C139 Malignant neoplasm of hypopharynx, unspecified: Secondary | ICD-10-CM | POA: Diagnosis present

## 2024-05-31 DIAGNOSIS — Z881 Allergy status to other antibiotic agents status: Secondary | ICD-10-CM | POA: Diagnosis not present

## 2024-05-31 DIAGNOSIS — R599 Enlarged lymph nodes, unspecified: Secondary | ICD-10-CM | POA: Diagnosis not present

## 2024-05-31 DIAGNOSIS — F0150 Vascular dementia without behavioral disturbance: Secondary | ICD-10-CM | POA: Diagnosis present

## 2024-05-31 DIAGNOSIS — F0280 Dementia in other diseases classified elsewhere without behavioral disturbance: Secondary | ICD-10-CM | POA: Diagnosis present

## 2024-05-31 DIAGNOSIS — R531 Weakness: Secondary | ICD-10-CM | POA: Diagnosis present

## 2024-05-31 DIAGNOSIS — Z510 Encounter for antineoplastic radiation therapy: Secondary | ICD-10-CM | POA: Diagnosis not present

## 2024-05-31 DIAGNOSIS — E785 Hyperlipidemia, unspecified: Secondary | ICD-10-CM | POA: Diagnosis present

## 2024-05-31 DIAGNOSIS — S0990XA Unspecified injury of head, initial encounter: Secondary | ICD-10-CM | POA: Diagnosis not present

## 2024-05-31 DIAGNOSIS — J189 Pneumonia, unspecified organism: Secondary | ICD-10-CM | POA: Diagnosis present

## 2024-05-31 DIAGNOSIS — Z66 Do not resuscitate: Secondary | ICD-10-CM | POA: Diagnosis present

## 2024-05-31 DIAGNOSIS — C770 Secondary and unspecified malignant neoplasm of lymph nodes of head, face and neck: Secondary | ICD-10-CM | POA: Diagnosis not present

## 2024-05-31 DIAGNOSIS — C7802 Secondary malignant neoplasm of left lung: Secondary | ICD-10-CM | POA: Diagnosis not present

## 2024-05-31 DIAGNOSIS — C320 Malignant neoplasm of glottis: Secondary | ICD-10-CM | POA: Diagnosis not present

## 2024-05-31 DIAGNOSIS — N400 Enlarged prostate without lower urinary tract symptoms: Secondary | ICD-10-CM | POA: Diagnosis present

## 2024-05-31 DIAGNOSIS — J90 Pleural effusion, not elsewhere classified: Secondary | ICD-10-CM | POA: Diagnosis not present

## 2024-05-31 DIAGNOSIS — E870 Hyperosmolality and hypernatremia: Secondary | ICD-10-CM | POA: Diagnosis present

## 2024-05-31 DIAGNOSIS — K8020 Calculus of gallbladder without cholecystitis without obstruction: Secondary | ICD-10-CM | POA: Diagnosis not present

## 2024-05-31 DIAGNOSIS — J392 Other diseases of pharynx: Secondary | ICD-10-CM | POA: Diagnosis not present

## 2024-05-31 DIAGNOSIS — I10 Essential (primary) hypertension: Secondary | ICD-10-CM | POA: Diagnosis not present

## 2024-05-31 DIAGNOSIS — Z87891 Personal history of nicotine dependence: Secondary | ICD-10-CM | POA: Diagnosis not present

## 2024-05-31 DIAGNOSIS — C7951 Secondary malignant neoplasm of bone: Secondary | ICD-10-CM | POA: Diagnosis present

## 2024-05-31 DIAGNOSIS — N1831 Chronic kidney disease, stage 3a: Secondary | ICD-10-CM | POA: Diagnosis not present

## 2024-05-31 DIAGNOSIS — R404 Transient alteration of awareness: Secondary | ICD-10-CM | POA: Diagnosis not present

## 2024-05-31 DIAGNOSIS — E042 Nontoxic multinodular goiter: Secondary | ICD-10-CM | POA: Diagnosis not present

## 2024-05-31 DIAGNOSIS — C7971 Secondary malignant neoplasm of right adrenal gland: Secondary | ICD-10-CM | POA: Diagnosis present

## 2024-05-31 DIAGNOSIS — Z7401 Bed confinement status: Secondary | ICD-10-CM | POA: Diagnosis not present

## 2024-05-31 DIAGNOSIS — C799 Secondary malignant neoplasm of unspecified site: Secondary | ICD-10-CM | POA: Diagnosis present

## 2024-05-31 DIAGNOSIS — I2510 Atherosclerotic heart disease of native coronary artery without angina pectoris: Secondary | ICD-10-CM | POA: Diagnosis not present

## 2024-05-31 DIAGNOSIS — I723 Aneurysm of iliac artery: Secondary | ICD-10-CM | POA: Diagnosis not present

## 2024-05-31 DIAGNOSIS — E876 Hypokalemia: Secondary | ICD-10-CM | POA: Diagnosis present

## 2024-05-31 DIAGNOSIS — I6522 Occlusion and stenosis of left carotid artery: Secondary | ICD-10-CM | POA: Diagnosis not present

## 2024-05-31 LAB — BASIC METABOLIC PANEL WITH GFR
Anion gap: 12 (ref 5–15)
BUN: 36 mg/dL — ABNORMAL HIGH (ref 8–23)
CO2: 21 mmol/L — ABNORMAL LOW (ref 22–32)
Calcium: 8.9 mg/dL (ref 8.9–10.3)
Chloride: 113 mmol/L — ABNORMAL HIGH (ref 98–111)
Creatinine, Ser: 1.44 mg/dL — ABNORMAL HIGH (ref 0.61–1.24)
GFR, Estimated: 48 mL/min — ABNORMAL LOW (ref >60)
Glucose, Bld: 91 mg/dL (ref 70–99)
Potassium: 3.7 mmol/L (ref 3.5–5.1)
Sodium: 146 mmol/L — ABNORMAL HIGH (ref 135–145)

## 2024-05-31 LAB — PROCALCITONIN: Procalcitonin: 0.1 ng/mL

## 2024-05-31 LAB — CBC
HCT: 35.2 % — ABNORMAL LOW (ref 39.0–52.0)
Hemoglobin: 11 g/dL — ABNORMAL LOW (ref 13.0–17.0)
MCH: 29.9 pg (ref 26.0–34.0)
MCHC: 31.3 g/dL (ref 30.0–36.0)
MCV: 95.7 fL (ref 80.0–100.0)
Platelets: 139 10*3/uL — ABNORMAL LOW (ref 150–400)
RBC: 3.68 MIL/uL — ABNORMAL LOW (ref 4.22–5.81)
RDW: 13 % (ref 11.5–15.5)
WBC: 11.9 10*3/uL — ABNORMAL HIGH (ref 4.0–10.5)
nRBC: 0 % (ref 0.0–0.2)

## 2024-05-31 LAB — I-STAT CG4 LACTIC ACID, ED: Lactic Acid, Venous: 1.3 mmol/L (ref 0.5–1.9)

## 2024-05-31 LAB — MAGNESIUM: Magnesium: 2.4 mg/dL (ref 1.7–2.4)

## 2024-05-31 MED ORDER — DEXTROSE 5 % IV SOLN: INTRAVENOUS | Status: AC

## 2024-05-31 MED ORDER — HEPARIN SODIUM (PORCINE) 5000 UNIT/ML IJ SOLN
5000.0000 [IU] | Freq: Three times a day (TID) | INTRAMUSCULAR | Status: DC
  Administered 2024-05-31 – 2024-06-04 (×10): 5000 [IU] via SUBCUTANEOUS
  Filled 2024-05-31 (×11): qty 1

## 2024-05-31 MED ORDER — ONDANSETRON HCL 4 MG PO TABS: 4.0000 mg | ORAL_TABLET | Freq: Four times a day (QID) | ORAL | Status: DC | PRN

## 2024-05-31 MED ORDER — SODIUM CHLORIDE 0.9 % IV SOLN
3.0000 g | Freq: Three times a day (TID) | INTRAVENOUS | Status: DC
  Administered 2024-05-31 – 2024-06-04 (×14): 3 g via INTRAVENOUS
  Filled 2024-05-31 (×16): qty 8

## 2024-05-31 MED ORDER — SODIUM CHLORIDE 0.9% FLUSH
3.0000 mL | Freq: Two times a day (BID) | INTRAVENOUS | Status: DC
  Administered 2024-05-31 – 2024-06-04 (×7): 3 mL via INTRAVENOUS

## 2024-05-31 MED ORDER — ACETAMINOPHEN 650 MG RE SUPP
650.0000 mg | Freq: Four times a day (QID) | RECTAL | Status: DC | PRN
Start: 2024-05-31 — End: 2024-06-04

## 2024-05-31 MED ORDER — ACETAMINOPHEN 325 MG PO TABS
650.0000 mg | ORAL_TABLET | Freq: Four times a day (QID) | ORAL | Status: DC | PRN
Start: 2024-05-31 — End: 2024-06-04
  Filled 2024-05-31: qty 2

## 2024-05-31 MED ORDER — HYDRALAZINE HCL 20 MG/ML IJ SOLN
5.0000 mg | Freq: Four times a day (QID) | INTRAMUSCULAR | Status: DC | PRN
  Administered 2024-05-31: 5 mg via INTRAVENOUS
  Filled 2024-05-31 (×2): qty 1

## 2024-05-31 MED ORDER — IOHEXOL 350 MG/ML SOLN
75.0000 mL | Freq: Once | INTRAVENOUS | Status: AC | PRN
  Administered 2024-05-31: 75 mL via INTRAVENOUS

## 2024-05-31 MED ORDER — FENTANYL CITRATE PF 50 MCG/ML IJ SOSY
12.5000 ug | PREFILLED_SYRINGE | INTRAMUSCULAR | Status: DC | PRN
  Administered 2024-06-01 – 2024-06-04 (×5): 12.5 ug via INTRAVENOUS
  Filled 2024-05-31 (×5): qty 1

## 2024-05-31 MED ORDER — ONDANSETRON HCL 4 MG/2ML IJ SOLN: 4.0000 mg | Freq: Four times a day (QID) | INTRAMUSCULAR | Status: DC | PRN

## 2024-05-31 MED ORDER — LACTATED RINGERS IV SOLN: INTRAVENOUS | Status: DC

## 2024-05-31 NOTE — Progress Notes (Signed)
 PROGRESS NOTE    Rodney Christensen  FMW:[id number] DOB: 10/26/1941 DOA: 05/30/2024 PCP: Chrystal Lamarr RAMAN, MD   Brief Narrative: 83 year old with past medical history significant for hypertension, BPH, dementia, non-small cell lung cancer status post chemoradiation and left lower lobe lobectomy 2012, CAD status post CABG presents with fatigue, generalized weakness, frequent fall, labored breathing and chest pain.  Symptoms started 3 weeks ago after he fell.  Patient fell again the night of admission and was having chest discomfort.  He was brought by EMS for further evaluation.  Evaluation in the ED: Patient was noted to be dehydrated sodium 146, creatinine 1.7 lactic acid 2.0 troponin 80, imaging most concerning for large Left glottic, laryngeal, and hypopharyngeal mass, bulky adenopathy, numerous bilateral pulmonary nodules, right adrenal gland nodule, lytic lesion involving the L4 vertebral body, and left lower lobe consolidation.    Assessment & Plan:   Principal Problem:   Metastatic disease (HCC) Active Problems:   Essential hypertension   CAD, NATIVE VESSEL   Mixed Alzheimer's and vascular dementia (HCC)   AKI (acute kidney injury) (HCC)   Pneumonia   1-Large left neck mass, bulky adenopathy, bilateral pulmonary nodules, adrenal nodules, and lytic L4 lesion noted on imaging. History of non-small cell lung cancer status post chemoradiation and left lower lobe lobectomy 2012,  - Will consult ENT, Dr Jesus consulted -Will also consult Dr Sherrod, his primary oncologist. -NPO, awaiting EN evaluation.   Pneumonia: -Ct chest : numerous pulmonary nodule, consolidation and bronchiolectatic changes.  -Presents with cough, productive. -Continue with IV Unasyn.   AKI:  -Presents with cr 1,8. Previous baseline 1.1 -Continue with IV fluids.  -down to 1.4  Hypernatremia:  -Change IV fluid D 5  Hypertension: -PRN hydralazine.  -hold lisinopril, Demadex due to AKI  CAD:  -Hold  lisinopril, metoprolol, statins while NPO  Dementia:  -Resume Namenda  when tolerate oral.        Estimated body mass index is 26.78 kg/m as calculated from the following:   Height as of 03/25/24: 5' 7 (1.702 m).   Weight as of 09/18/23: 77.6 kg.   DVT prophylaxis: Heparin  Code Status: Full code Family Communication:No family at bedside Disposition Plan:  Status is: Inpatient Remains inpatient appropriate because: management of PNA, AKI    Consultants:  ENT, Dr Jesus.  Dr Sherrod  Procedures:    Antimicrobials:    Subjective: He is alert, he has some secretion mouth, nurse will suction it.  He denies pain.  He report cough.   Objective: Vitals:   05/31/24 0630 05/31/24 0645 05/31/24 0700 05/31/24 0715  BP: (!) 175/70 (!) 155/63 (!) 142/64 (!) 180/80  Pulse: 68 63 64 71  Resp: 18 11 16 17   Temp:      TempSrc:      SpO2: 96% 95% 99% 99%   No intake or output data in the 24 hours ending 05/31/24 0733 There were no vitals filed for this visit.  Examination:  General exam: Appears calm and comfortable  Respiratory system: BL ronchus. SABRA Respiratory effort normal. Cardiovascular system: S1 & S2 heard, RRR.  Gastrointestinal system: Abdomen is nondistended, soft and nontender.  Central nervous system: Alert  Extremities: Symmetric 5 x 5 power.   Data Reviewed: I have personally reviewed following labs and imaging studies  CBC: Recent Labs  Lab 05/30/24 2209 05/30/24 2220 05/31/24 0409  WBC 9.9  --  11.9*  HGB 10.7* 11.2*  11.6* 11.0*  HCT 35.3* 33.0*  34.0* 35.2*  MCV  97.0  --  95.7  PLT 150  --  139*   Basic Metabolic Panel: Recent Labs  Lab 05/30/24 2209 05/30/24 2220 05/31/24 0409  NA 146* 147*  146* 146*  K 3.7 3.6  3.6 3.7  CL 111 109 113*  CO2 24  --  21*  GLUCOSE 92 95 91  BUN 39* 37* 36*  CREATININE 1.73* 1.80* 1.44*  CALCIUM 9.0  --  8.9  MG  --   --  2.4   GFR: CrCl cannot be calculated (Unknown ideal weight.). Liver  Function Tests: Recent Labs  Lab 05/30/24 2209  AST 27  ALT 15  ALKPHOS 79  BILITOT 0.5  PROT 7.1  ALBUMIN 3.2*   Recent Labs  Lab 05/30/24 2209  LIPASE 39   No results for input(s): AMMONIA in the last 168 hours. Coagulation Profile: Recent Labs  Lab 05/30/24 2313  INR 1.1   Cardiac Enzymes: No results for input(s): CKTOTAL, CKMB, CKMBINDEX, TROPONINI in the last 168 hours. BNP (last 3 results) No results for input(s): PROBNP in the last 8760 hours. HbA1C: No results for input(s): HGBA1C in the last 72 hours. CBG: No results for input(s): GLUCAP in the last 168 hours. Lipid Profile: No results for input(s): CHOL, HDL, LDLCALC, TRIG, CHOLHDL, LDLDIRECT in the last 72 hours. Thyroid  Function Tests: No results for input(s): TSH, T4TOTAL, FREET4, T3FREE, THYROIDAB in the last 72 hours. Anemia Panel: No results for input(s): VITAMINB12, FOLATE, FERRITIN, TIBC, IRON, RETICCTPCT in the last 72 hours. Sepsis Labs: Recent Labs  Lab 05/30/24 2220 05/31/24 0025 05/31/24 0409  PROCALCITON  --   --  0.10  LATICACIDVEN 2.0* 1.3  --     Recent Results (from the past 240 hours)  Culture, blood (routine x 2)     Status: None (Preliminary result)   Collection Time: 05/30/24 11:11 PM   Specimen: BLOOD  Result Value Ref Range Status   Specimen Description BLOOD RIGHT ARM  Final   Special Requests   Final    BOTTLES DRAWN AEROBIC ONLY Blood Culture results may not be optimal due to an inadequate volume of blood received in culture bottles   Culture   Final    NO GROWTH < 12 HOURS Performed at [facility], [address], [place], [place] [address]    Report Status PENDING  Incomplete  Culture, blood (routine x 2)     Status: None (Preliminary result)   Collection Time: 05/31/24 12:23 AM   Specimen: BLOOD  Result Value Ref Range Status   Specimen Description BLOOD BLOOD RIGHT HAND  Final   Special Requests   Final     BOTTLES DRAWN AEROBIC AND ANAEROBIC Blood Culture adequate volume   Culture   Final    NO GROWTH < 12 HOURS Performed at [facility], [address], [place], [place] [address]    Report Status PENDING  Incomplete         Radiology Studies: CT Soft Tissue Neck W Contrast Result Date: 05/31/2024 CLINICAL DATA:  abnormal non con neck CT. EXAM: CT NECK WITH CONTRAST TECHNIQUE: Multidetector CT imaging of the neck was performed using the standard protocol following the bolus administration of intravenous contrast. RADIATION DOSE REDUCTION: This exam was performed according to the departmental dose-optimization program which includes automated exposure control, adjustment of the mA and/or kV according to patient size and/or use of iterative reconstruction technique. CONTRAST:  75mL OMNIPAQUE  IOHEXOL  350 MG/ML SOLN COMPARISON:  None Available. FINDINGS:  Pharynx and larynx: Bulky/large 4.1 x 3.5 x 3.7 cm glottic laryngeal and hypopharyngeal mass with both supraglottic and subglottic extension clips. The mass abuts the arytenoid cartilage and extends anteriorly to involve the left true cord with extension through and erosion of the he invasion of the paraglottic fat and invasion through the thyroid  cartilage. Salivary glands: Absent left submandibular gland. Right submandibular gland and parotid glands are within normal limits. Clips in the left submandibular region and left neck. Thyroid : Subcentimeter thyroid  nodules which do not require further imaging follow-up (ref: J Am Coll Radiol. 2015 Feb;12(2): 143-50). Lymph nodes: Enlarged, bulky 2.9 cm left level II Clips in the left neck. Multiple additional surrounding enlarged left level II and rounded level III lymph nodes. Vascular: Severe stenosis of the left internal carotid artery proximally, not well characterized on this study. Extensive atherosclerosis. Limited intracranial: Negative. Visualized orbits: Negative. Mastoids and visualized  paranasal sinuses: Multiple retention cysts. No mastoid effusions. Skeleton: Further evaluated on same day CT of the cervical spine. Upper chest: Evaluated on same day CT of the chest/abdomen/pelvis. IMPRESSION: 1. Large 4.1 cm left glottic laryngeal and hypopharyngeal mass with both supraglottic and subglottic extension, involvement of the left true cord and paraglottic fat, and invasion through the left thyroid  cartilage. Recommend ENT consultation. 2. Bulky enlarged 2.9 cm left level II, compatible with nodal metastasis. Multiple additional surrounding enlarged left level II and rounded level III lymph nodes, suspicious for additional metastases. A PET-CT could provide more definitive nodal staging if clinically warranted. 3. Severe stenosis of the left internal carotid artery proximally, not well characterized on this study. A CTA of the neck could further evaluate/quantify. Preliminary findings discussed with Dr. Haze via telephone at 1:22 a.m. Electronically Signed   By: Gilmore GORMAN Molt M.D.   On: 05/31/2024 01:26   CT CHEST ABDOMEN PELVIS W CONTRAST Result Date: 05/31/2024 CLINICAL DATA:  Sepsis. Suspicious pulmonary nodule seen on CT cervical spine earlier today EXAM: CT CHEST, ABDOMEN, AND PELVIS WITH CONTRAST TECHNIQUE: Multidetector CT imaging of the chest, abdomen and pelvis was performed following the standard protocol during bolus administration of intravenous contrast. RADIATION DOSE REDUCTION: This exam was performed according to the departmental dose-optimization program which includes automated exposure control, adjustment of the mA and/or kV according to patient size and/or use of iterative reconstruction technique. CONTRAST:  75mL OMNIPAQUE  IOHEXOL  350 MG/ML SOLN COMPARISON:  CT cervical spine earlier today; CT chest 06/20/2020; PET/CT 02/18/2011 FINDINGS: CT CHEST FINDINGS Cardiovascular: Coronary artery and aortic atherosclerotic calcification. No pericardial effusion. Normal caliber  thoracic aorta. CABG. Mediastinum/Nodes: Wispy secretions in the trachea. Small hiatal hernia. No thoracic adenopathy. Left-sided soft tissue nodule posterior to the hyoid bone. See dedicated CT neck for details. Lungs/Pleura: Numerous bilateral pulmonary nodules suspicious for metastatic disease. The largest on the right is in the right lower lobe and measures 2.2 x 2.3 cm (series 5/image 104) the largest in the left is in the left upper lobe and measures 2.1 x 1.3 cm (5/74). Unchanged fullness in the left infrahilar region due to postradiation changes. Consolidation and bronchiectatic changes in the treated left lung following left lower lobectomy. Small left-sided pleural effusion. Small left pleural effusion. No pneumothorax. Musculoskeletal: No acute fracture or destructive osseous lesion. Sternotomy. CT ABDOMEN PELVIS FINDINGS Hepatobiliary: Cholelithiasis. No evidence of acute cholecystitis. No biliary dilation. No focal hepatic lesion. Pancreas: Unremarkable. Spleen: Unremarkable. Adrenals/Urinary Tract: New 1.7 x 1.3 cm nodule in the right adrenal gland concerning for metastasis. Unremarkable left adrenal gland. Cortical renal scarring  both kidneys. No urinary calculi or hydronephrosis. Unremarkable bladder. Stomach/Bowel: Normal caliber large and small bowel. No bowel wall thickening. Small hiatal hernia. Stomach is otherwise within normal limits. Vascular/Lymphatic: Advanced aortic atherosclerotic calcification. 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 No lymphadenopathy. Reproductive: Enlarged prostate indenting the floor of the bladder. Other: No free intraperitoneal fluid or air. Musculoskeletal: No acute fracture. Lytic lesion with surrounding sclerosis and cortical breakthrough and extraosseous soft tissue component in the L4 vertebral body consistent with metastasis. This measures 2.2 x 2.0 cm. IMPRESSION: 1. Numerous bilateral pulmonary nodules consistent with metastatic disease. 2. New 1.7 cm  nodule in the right adrenal gland concerning for metastasis. 3. Lytic lesion with surrounding sclerosis and cortical breakthrough and extraosseous soft tissue component in the L4 vertebral body consistent with metastasis. 4. Chronic post surgical and radiation changes to the left lower lung. Small left pleural effusion. 5. Soft tissue nodule in the left neck at the level of the hyoid bone suspicious for primary malignancy or metastasis. See separate report of CT neck for details. 6. Cholelithiasis. 7. 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 8. Aortic Atherosclerosis (ICD10-I70.0). Electronically Signed   By: Norman Gatlin M.D.   On: 05/31/2024 00:29   CT Cervical Spine Wo Contrast Result Date: 05/30/2024 CLINICAL DATA:  Neck trauma (Age >= 65y) EXAM: CT CERVICAL SPINE WITHOUT CONTRAST TECHNIQUE: Multidetector CT imaging of the cervical spine was performed without intravenous contrast. Multiplanar CT image reconstructions were also generated. RADIATION DOSE REDUCTION: This exam was performed according to the departmental dose-optimization program which includes automated exposure control, adjustment of the mA and/or kV according to patient size and/or use of iterative reconstruction technique. COMPARISON:  None Available. FINDINGS: Alignment: Normal. Skull base and vertebrae: No acute fracture. Vertebral body heights are maintained. The dens and skull base are intact. Soft tissues and spinal canal: No prevertebral fluid or swelling. No visible canal hematoma. Disc levels: Multilevel degenerative change throughout the cervical spine. Disc space narrowing and spurring at multiple levels with facet hypertrophy. Upper chest: Postsurgical change at the carotid bulb with asymmetric rounded soft tissue density, series 5, image 54. Abnormal soft tissue density in the left neck just posterior to the hyoid bone causing mass effect on the airway, series 5, image 78. There are multiple apical pulmonary nodules, for  example in the left upper lobe measuring 6 mm series 4, image 99. Right upper lobe only partially included in the field of view 8 mm series 4, image 102. Nodules are new from 2021 chest CT. Other: Right carotid calcifications. IMPRESSION: 1. No acute fracture or subluxation of the cervical spine. 2. Multilevel degenerative change. 3. Postsurgical change at the carotid bulb with asymmetric rounded soft tissue density. Abnormal soft tissue density in the left neck just posterior to the hyoid bone causing mass effect on the airway. Findings may represent adenopathy. Recommend contrast-enhanced neck CT for further assessment. 4. Multiple apical pulmonary nodules, new from 2021 chest CT, suspicious for metastatic disease. Chest CT recommended for complete assessment. Electronically Signed   By: Andrea Gasman M.D.   On: 05/30/2024 23:13   DG Chest Port 1 View Result Date: 05/30/2024 CLINICAL DATA:  Chest pain EXAM: PORTABLE CHEST 1 VIEW COMPARISON:  05/30/2024 FINDINGS: Stable small laterally loculated left pleural effusion. Lung volumes are small and pulmonary insufflation has diminished since prior examination. Resultant vascular crowding at the hila. No confluent pulmonary infiltrate. No pneumothorax. No pleural effusion on the right. Coronary artery bypass grafting has been performed. Cardiac  size within normal limits. No acute bone abnormality IMPRESSION: 1. Pulmonary hypoinflation. 2. Stable small laterally loculated left pleural effusion. Electronically Signed   By: Dorethia Molt M.D.   On: 05/30/2024 23:11   CT Head Wo Contrast Result Date: 05/30/2024 CLINICAL DATA:  Head trauma EXAM: CT HEAD WITHOUT CONTRAST TECHNIQUE: Contiguous axial images were obtained from the base of the skull through the vertex without intravenous contrast. RADIATION DOSE REDUCTION: This exam was performed according to the departmental dose-optimization program which includes automated exposure control, adjustment of the mA  and/or kV according to patient size and/or use of iterative reconstruction technique. COMPARISON:  MRI head 09/01/2021 and CT head 04/27/2006 FINDINGS: Brain: No intracranial hemorrhage, mass effect, or evidence of acute infarct. No hydrocephalus. No extra-axial fluid collection. Generalized cerebral atrophy and chronic small vessel ischemic disease. Vascular: No hyperdense vessel. Intracranial arterial calcification. Skull: No fracture or focal lesion. Sinuses/Orbits: No acute finding. Other: None. IMPRESSION: No acute intracranial abnormality. Electronically Signed   By: Norman Gatlin M.D.   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 Presents with chest pain. Fell yesterday but did not seek medical attention. EXAM: CHEST - 2 VIEW COMPARISON:  Chest CT with contrast 06/20/2020 FINDINGS: The lungs are mildly emphysematous. Some volume loss again noted on the left with left lower lobectomy. There are post treatment changes in the dorsal left mid lung best seen on the lateral. There is increased opacity is suspected in the left base concerning for pneumonia or aspiration. The remaining lungs are clear. No substantial pleural effusion is seen. Stable mild cardiomegaly with CABG changes. Central vessel markings are normal caliber. Stable mediastinum with aortic atherosclerosis. Osteopenia and bridging enthesopathy thoracic spine with no new osseous findings. IMPRESSION: 1. Increased opacity in the left base concerning for pneumonia or aspiration. 2. Emphysema, left lower lobectomy and post treatment changes 3. Stable mild cardiomegaly with CABG changes. 4. Aortic atherosclerosis. 5. Osteopenia and bridging enthesopathy thoracic spine. Electronically Signed   By: Francis Quam M.D.   On: 05/30/2024 22:11        Scheduled Meds:  heparin  5,000 Units Subcutaneous Q8H   sodium chloride  flush  3  mL Intravenous Q12H   Continuous Infusions:  ampicillin-sulbactam (UNASYN) IV Stopped (05/31/24 0622)   lactated ringers 100 mL/hr at 05/31/24 0340     LOS: 0 days    Time spent: 35 minutes    Suhas Estis A Alaena Strader, MD [facility]   If 7PM-7AM, please contact night-coverage [email or web address]  05/31/2024, 7:33 AM

## 2024-05-31 NOTE — Plan of Care (Signed)

## 2024-05-31 NOTE — H&P (Signed)
 History and Physical    Rodney Christensen FMW:[id number] DOB: Apr 22, 1941 DOA: 05/30/2024  PCP: Chrystal Lamarr RAMAN, MD   Patient coming from: Home   Chief Complaint: Fatigue, general weakness, falls, chest pain, labored breathing   HPI: Rodney Christensen is a pleasant 83 y.o. male with medical history significant for hypertension, BPH, dementia, non-small cell lung cancer status post chemoradiation and left lower lobectomy in 2012, and CAD status post CABG, now presenting with fatigue, generalized weakness, falls, labored breathing, and chest pain.  Patient's wife notes that the patient has exhibited worsening fatigue, generalized weakness, and labored breathing since a fall 3 weeks ago.  He fell again last night and was complaining of chest discomfort.  His wife called EMS and he was given 324 mg of aspirin prior to arrival in the ED.  ED Course: Upon arrival to the ED, patient is found to be afebrile and saturating mid 90s on room air with normal HR and stable BP.  Labs are most notable for sodium 146, creatinine 1.73, normal WBC, lactic acid 2.0, and troponin 80.  Imaging is most concerning for large left glottic, laryngeal, and hypopharyngeal mass, bulky adenopathy, numerous bilateral pulmonary nodules, right adrenal gland nodule, lytic lesion involving the L4 vertebral body, and left lower lobe consolidation.  Blood cultures were collected and the patient was given IV fluids and antibiotics.  Review of Systems:  ROS limited by patient's clinical condition.  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 His smoking use included cigarettes. He started smoking about 67 years ago. He has a 62.5 pack-year smoking history. He has never used smokeless tobacco. He reports current alcohol use. He reports that he does not use drugs.  Allergies  Allergen Reactions   Cephalexin     REACTION: Hives   Latanoprost     REACTION: Reaction not known.    Family History  Problem Relation Age of Onset   Stroke Mother      Prior to Admission medications   Medication Sig Start Date End Date Taking? Authorizing Provider  metoprolol succinate (TOPROL-XL) 25 MG 24 hr tablet Take 25 mg by mouth daily. 05/03/24  Yes [provider]  Potassium Chloride ER 20 MEQ TBCR Take 1 tablet by mouth daily. 05/03/24  Yes [provider]  amLODipine (NORVASC) 10 MG tablet Take 10 mg by mouth daily.    [provider]  Ascorbic Acid (VITAMIN C) 500 MG tablet Take 500 mg by mouth daily.    [provider]  B Complex Vitamins (B COMPLEX 100 PO) Take by mouth daily.    [provider]  beta carotene 25000 UNIT capsule Take 25,000 Units by  mouth daily.    [provider]  Bilberry 500 MG CAPS Take 500 mg by mouth daily.    [provider]  bimatoprost (LUMIGAN) 0.03 % ophthalmic drops Place 1 drop into both eyes at bedtime.    [provider]  Calcium-Magnesium-Zinc (CAL-MAG-ZINC PO) Take by mouth as directed.    [provider]  Chelated Zinc 50 MG TABS Take by mouth daily.    [provider]  Cinnamon 500 MG capsule Take 1,000 mg by mouth daily.    [provider]  Coenzyme Q10 (CO Q 10) 100 MG CAPS Take by  mouth daily.    [provider]  donepezil  (ARICEPT ) 5 MG tablet TAKE 1 TABLET(5 MG) BY MOUTH AT BEDTIME 04/19/24   Wertman, Sara E, PA-C  dorzolamide (TRUSOPT) 2 % ophthalmic solution Place 1 drop into both eyes 2 (two) times daily as needed.    [provider]  dorzolamide-timolol (COSOPT) 22.3-6.8 MG/ML ophthalmic solution Place 1 drop into the left eye at bedtime. 06/17/15   [provider]  doxazosin (CARDURA) 8 MG tablet Take 8 mg by mouth at bedtime.      [provider]  fish oil-omega-3 fatty acids 1000 MG capsule Take 2 g by mouth 3 (three) times daily.    [provider]  folic acid (FOLVITE) 1 MG tablet Take 1 mg by mouth daily.    [provider]  Garlic 1000 MG CAPS Take by mouth daily.    [provider]  Ginkgo Biloba 40 MG TABS Take by mouth daily.    [provider]  lisinopril (PRINIVIL,ZESTRIL) 40 MG tablet Take 40 mg by mouth 2 (two) times daily.    [provider]  LUTEIN PO Take by mouth 2 (two) times daily.    [provider]  Melatonin 1 MG CAPS Take by mouth at bedtime.    [provider]  memantine  (NAMENDA ) 10 MG tablet TAKE 1 TABLET(10 MG) BY MOUTH TWICE DAILY 01/22/24   Wertman, Sara E, PA-C  metoprolol (LOPRESSOR) 50 MG tablet Take 50 mg by mouth 2 (two) times daily.      [provider]  Multiple Vitamin (MULTIVITAMIN) tablet Take 1 tablet by mouth daily.    [provider]  potassium chloride SA (K-DUR,KLOR-CON) 20 MEQ tablet  02/22/11   [provider]  simvastatin (ZOCOR) 20 MG tablet Take 20 mg by mouth every evening.    [provider]  torsemide (DEMADEX) 20 MG tablet 20 mg daily. 02/22/11   [provider]    Physical Exam: Vitals:   05/31/24 0145 05/31/24 0200 05/31/24 0215 05/31/24 0230  BP: (!) 175/91 (!) 187/96 (!) 151/83 (!) 165/88  Pulse: 87 90 87 86  Resp: 18 16 16 15   Temp:      TempSrc:      SpO2: 98% 97%  96% 95%    Constitutional: NAD, no diaphoresis   Eyes: PERTLA, lids and conjunctivae normal ENMT: Mucous membranes are moist. Posterior pharynx clear of any exudate or lesions.   Neck: surgical scar left neck. Firm non-tender, non-mobile left neck nodule.   Respiratory: Rhonchi at left base. No wheezing.  Cardiovascular: S1 & S2 heard, regular rate and rhythm. No extremity edema.  Abdomen: No tenderness, soft. Bowel sounds active.  Musculoskeletal: no clubbing / cyanosis. No joint deformity upper and lower extremities.   Skin: no significant rashes, lesions, ulcers. Warm, dry, well-perfused. Neurologic: No gross facial asymmetry. Moving all extremities. Alert  and oriented to person.  Psychiatric: Calm. Cooperative.    Labs and Imaging on Admission: I have personally reviewed following labs and imaging studies  CBC: Recent Labs  Lab 05/30/24 2209 05/30/24 2220  WBC 9.9  --   HGB 10.7* 11.2*  11.6*  HCT 35.3* 33.0*  34.0*  MCV 97.0  --   PLT 150  --    Basic Metabolic Panel: Recent Labs  Lab 05/30/24 2209 05/30/24 2220  NA 146* 147*  146*  K 3.7 3.6  3.6  CL 111 109  CO2 24  --   GLUCOSE 92 95  BUN 39* 37*  CREATININE 1.73* 1.80*  CALCIUM 9.0  --    GFR: CrCl cannot be calculated (Unknown ideal weight.). Liver Function Tests: Recent Labs  Lab 05/30/24 2209  AST 27  ALT 15  ALKPHOS 79  BILITOT 0.5  PROT 7.1  ALBUMIN 3.2*   Recent Labs  Lab 05/30/24 2209  LIPASE 39   No results for input(s): AMMONIA in the last 168 hours. Coagulation Profile: Recent Labs  Lab 05/30/24 2313  INR 1.1   Cardiac Enzymes: No results for input(s): CKTOTAL, CKMB, CKMBINDEX, TROPONINI in the last 168 hours. BNP (last 3 results) No results for input(s): PROBNP in the last 8760 hours. HbA1C: No results for input(s): HGBA1C in the last 72 hours. CBG: No results for input(s): GLUCAP in the last 168 hours. Lipid Profile: No results for input(s): CHOL,  HDL, LDLCALC, TRIG, CHOLHDL, LDLDIRECT in the last 72 hours. Thyroid  Function Tests: No results for input(s): TSH, T4TOTAL, FREET4, T3FREE, THYROIDAB in the last 72 hours. Anemia Panel: No results for input(s): VITAMINB12, FOLATE, FERRITIN, TIBC, IRON, RETICCTPCT in the last 72 hours. Urine analysis:    Component Value Date/Time   COLORURINE YELLOW 05/30/2024 2216   APPEARANCEUR CLEAR 05/30/2024 2216   LABSPEC 1.012 05/30/2024 2216   PHURINE 5.0 05/30/2024 2216   GLUCOSEU NEGATIVE 05/30/2024 2216   HGBUR NEGATIVE 05/30/2024 2216   BILIRUBINUR NEGATIVE 05/30/2024 2216   KETONESUR NEGATIVE 05/30/2024 2216   PROTEINUR NEGATIVE 05/30/2024 2216   UROBILINOGEN 0.2 06/07/2011 1542   NITRITE NEGATIVE 05/30/2024 2216   LEUKOCYTESUR NEGATIVE 05/30/2024 2216   Sepsis Labs: @LABRCNTIP (procalcitonin:4,lacticidven:4) )No results found for this or any previous visit (from the past 240 hours).   Radiological Exams on Admission: CT Soft Tissue Neck W Contrast Result Date: 05/31/2024 CLINICAL DATA:  abnormal non con neck CT. EXAM: CT NECK WITH CONTRAST TECHNIQUE: Multidetector CT imaging of the neck was performed using the standard protocol following the bolus administration of intravenous contrast. RADIATION DOSE REDUCTION: This exam was performed according to the departmental dose-optimization program which includes automated exposure control, adjustment of the mA and/or kV according to patient size and/or use of iterative reconstruction technique. CONTRAST:  75mL OMNIPAQUE  IOHEXOL  350 MG/ML SOLN COMPARISON:  None Available. FINDINGS: Pharynx and larynx: Bulky/large 4.1 x 3.5 x 3.7 cm glottic laryngeal and hypopharyngeal mass with both supraglottic and subglottic extension clips. The mass abuts the arytenoid cartilage and extends anteriorly to involve the left true cord with extension through and erosion of the he invasion of the paraglottic fat and invasion through the  thyroid  cartilage. Salivary glands: Absent left submandibular gland. Right submandibular gland and parotid glands are within normal limits. Clips in the left submandibular region and left neck. Thyroid : Subcentimeter thyroid  nodules which do not require further imaging follow-up (ref: J Am Coll Radiol. 2015 Feb;12(2): 143-50). Lymph nodes: Enlarged, bulky 2.9 cm left level II Clips in  the left neck. Multiple additional surrounding enlarged left level II and rounded level III lymph nodes. Vascular: Severe stenosis of the left internal carotid artery proximally, not well characterized on this study. Extensive atherosclerosis. Limited intracranial: Negative. Visualized orbits: Negative. Mastoids and visualized paranasal sinuses: Multiple retention cysts. No mastoid effusions. Skeleton: Further evaluated on same day CT of the cervical spine. Upper chest: Evaluated on same day CT of the chest/abdomen/pelvis. IMPRESSION: 1. Large 4.1 cm left glottic laryngeal and hypopharyngeal mass with both supraglottic and subglottic extension, involvement of the left true cord and paraglottic fat, and invasion through the left thyroid  cartilage. Recommend ENT consultation. 2. Bulky enlarged 2.9 cm left level II, compatible with nodal metastasis. Multiple additional surrounding enlarged left level II and rounded level III lymph nodes, suspicious for additional metastases. A PET-CT could provide more definitive nodal staging if clinically warranted. 3. Severe stenosis of the left internal carotid artery proximally, not well characterized on this study. A CTA of the neck could further evaluate/quantify. Preliminary findings discussed with Dr. Haze via telephone at 1:22 a.m. Electronically Signed   By: Gilmore GORMAN Molt M.D.   On: 05/31/2024 01:26   CT CHEST ABDOMEN PELVIS W CONTRAST Result Date: 05/31/2024 CLINICAL DATA:  Sepsis. Suspicious pulmonary nodule seen on CT cervical spine earlier today EXAM: CT CHEST, ABDOMEN, AND PELVIS  WITH CONTRAST TECHNIQUE: Multidetector CT imaging of the chest, abdomen and pelvis was performed following the standard protocol during bolus administration of intravenous contrast. RADIATION DOSE REDUCTION: This exam was performed according to the departmental dose-optimization program which includes automated exposure control, adjustment of the mA and/or kV according to patient size and/or use of iterative reconstruction technique. CONTRAST:  75mL OMNIPAQUE  IOHEXOL  350 MG/ML SOLN COMPARISON:  CT cervical spine earlier today; CT chest 06/20/2020; PET/CT 02/18/2011 FINDINGS: CT CHEST FINDINGS Cardiovascular: Coronary artery and aortic atherosclerotic calcification. No pericardial effusion. Normal caliber thoracic aorta. CABG. Mediastinum/Nodes: Wispy secretions in the trachea. Small hiatal hernia. No thoracic adenopathy. Left-sided soft tissue nodule posterior to the hyoid bone. See dedicated CT neck for details. Lungs/Pleura: Numerous bilateral pulmonary nodules suspicious for metastatic disease. The largest on the right is in the right lower lobe and measures 2.2 x 2.3 cm (series 5/image 104) the largest in the left is in the left upper lobe and measures 2.1 x 1.3 cm (5/74). Unchanged fullness in the left infrahilar region due to postradiation changes. Consolidation and bronchiectatic changes in the treated left lung following left lower lobectomy. Small left-sided pleural effusion. Small left pleural effusion. No pneumothorax. Musculoskeletal: No acute fracture or destructive osseous lesion. Sternotomy. CT ABDOMEN PELVIS FINDINGS Hepatobiliary: Cholelithiasis. No evidence of acute cholecystitis. No biliary dilation. No focal hepatic lesion. Pancreas: Unremarkable. Spleen: Unremarkable. Adrenals/Urinary Tract: New 1.7 x 1.3 cm nodule in the right adrenal gland concerning for metastasis. Unremarkable left adrenal gland. Cortical renal scarring both kidneys. No urinary calculi or hydronephrosis. Unremarkable  bladder. Stomach/Bowel: Normal caliber large and small bowel. No bowel wall thickening. Small hiatal hernia. Stomach is otherwise within normal limits. Vascular/Lymphatic: Advanced aortic atherosclerotic calcification. 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 No lymphadenopathy. Reproductive: Enlarged prostate indenting the floor of the bladder. Other: No free intraperitoneal fluid or air. Musculoskeletal: No acute fracture. Lytic lesion with surrounding sclerosis and cortical breakthrough and extraosseous soft tissue component in the L4 vertebral body consistent with metastasis. This measures 2.2 x 2.0 cm. IMPRESSION: 1. Numerous bilateral pulmonary nodules consistent with metastatic disease. 2. New 1.7 cm nodule in the right adrenal gland concerning  for metastasis. 3. Lytic lesion with surrounding sclerosis and cortical breakthrough and extraosseous soft tissue component in the L4 vertebral body consistent with metastasis. 4. Chronic post surgical and radiation changes to the left lower lung. Small left pleural effusion. 5. Soft tissue nodule in the left neck at the level of the hyoid bone suspicious for primary malignancy or metastasis. See separate report of CT neck for details. 6. Cholelithiasis. 7. 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 8. Aortic Atherosclerosis (ICD10-I70.0). Electronically Signed   By: Norman Gatlin M.D.   On: 05/31/2024 00:29   CT Cervical Spine Wo Contrast Result Date: 05/30/2024 CLINICAL DATA:  Neck trauma (Age >= 65y) EXAM: CT CERVICAL SPINE WITHOUT CONTRAST TECHNIQUE: Multidetector CT imaging of the cervical spine was performed without intravenous contrast. Multiplanar CT image reconstructions were also generated. RADIATION DOSE REDUCTION: This exam was performed according to the departmental dose-optimization program which includes automated exposure control, adjustment of the mA and/or kV according to patient size and/or use of iterative reconstruction technique.  COMPARISON:  None Available. FINDINGS: Alignment: Normal. Skull base and vertebrae: No acute fracture. Vertebral body heights are maintained. The dens and skull base are intact. Soft tissues and spinal canal: No prevertebral fluid or swelling. No visible canal hematoma. Disc levels: Multilevel degenerative change throughout the cervical spine. Disc space narrowing and spurring at multiple levels with facet hypertrophy. Upper chest: Postsurgical change at the carotid bulb with asymmetric rounded soft tissue density, series 5, image 54. Abnormal soft tissue density in the left neck just posterior to the hyoid bone causing mass effect on the airway, series 5, image 78. There are multiple apical pulmonary nodules, for example in the left upper lobe measuring 6 mm series 4, image 99. Right upper lobe only partially included in the field of view 8 mm series 4, image 102. Nodules are new from 2021 chest CT. Other: Right carotid calcifications. IMPRESSION: 1. No acute fracture or subluxation of the cervical spine. 2. Multilevel degenerative change. 3. Postsurgical change at the carotid bulb with asymmetric rounded soft tissue density. Abnormal soft tissue density in the left neck just posterior to the hyoid bone causing mass effect on the airway. Findings may represent adenopathy. Recommend contrast-enhanced neck CT for further assessment. 4. Multiple apical pulmonary nodules, new from 2021 chest CT, suspicious for metastatic disease. Chest CT recommended for complete assessment. Electronically Signed   By: Andrea Gasman M.D.   On: 05/30/2024 23:13   DG Chest Port 1 View Result Date: 05/30/2024 CLINICAL DATA:  Chest pain EXAM: PORTABLE CHEST 1 VIEW COMPARISON:  05/30/2024 FINDINGS: Stable small laterally loculated left pleural effusion. Lung volumes are small and pulmonary insufflation has diminished since prior examination. Resultant vascular crowding at the hila. No confluent pulmonary infiltrate. No pneumothorax.  No pleural effusion on the right. Coronary artery bypass grafting has been performed. Cardiac size within normal limits. No acute bone abnormality IMPRESSION: 1. Pulmonary hypoinflation. 2. Stable small laterally loculated left pleural effusion. Electronically Signed   By: Dorethia Molt M.D.   On: 05/30/2024 23:11   CT Head Wo Contrast Result Date: 05/30/2024 CLINICAL DATA:  Head trauma EXAM: CT HEAD WITHOUT CONTRAST TECHNIQUE: Contiguous axial images were obtained from the base of the skull through the vertex without intravenous contrast. RADIATION DOSE REDUCTION: This exam was performed according to the departmental dose-optimization program which includes automated exposure control, adjustment of the mA and/or kV according to patient size and/or use of iterative reconstruction technique. COMPARISON:  MRI head 09/01/2021 and  CT head 04/27/2006 FINDINGS: Brain: No intracranial hemorrhage, mass effect, or evidence of acute infarct. No hydrocephalus. No extra-axial fluid collection. Generalized cerebral atrophy and chronic small vessel ischemic disease. Vascular: No hyperdense vessel. Intracranial arterial calcification. Skull: No fracture or focal lesion. Sinuses/Orbits: No acute finding. Other: None. IMPRESSION: No acute intracranial abnormality. Electronically Signed   By: Norman Gatlin M.D.   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 Presents with chest pain. Fell yesterday but did not seek medical attention. EXAM: CHEST - 2 VIEW COMPARISON:  Chest CT with contrast 06/20/2020 FINDINGS: The lungs are mildly emphysematous. Some volume loss again noted on the left with left lower lobectomy. There are post treatment changes in the dorsal left mid lung best seen on the lateral. There is increased opacity is suspected in the left base concerning for pneumonia or aspiration. The remaining lungs  are clear. No substantial pleural effusion is seen. Stable mild cardiomegaly with CABG changes. Central vessel markings are normal caliber. Stable mediastinum with aortic atherosclerosis. Osteopenia and bridging enthesopathy thoracic spine with no new osseous findings. IMPRESSION: 1. Increased opacity in the left base concerning for pneumonia or aspiration. 2. Emphysema, left lower lobectomy and post treatment changes 3. Stable mild cardiomegaly with CABG changes. 4. Aortic atherosclerosis. 5. Osteopenia and bridging enthesopathy thoracic spine. Electronically Signed   By: Francis Quam M.D.   On: 05/30/2024 22:11    EKG: Independently reviewed. Sinus or ectopic atrial rhythm.   Assessment/Plan   1. Metastatic disease  - Large left neck mass, bulky adenopathy, b/l pulmonary nodules, adrenal nodule, and lytic L4 lesion noted on imaging in ED  - Consult ENT in am    2. Pneuomonia  - Culture sputum, check strep pneumo and legionella antigens, continue empiric antibiotics, follow cultures and clinical course -   3. AKI  - No hydronephrosis on CT in ED  - Continue IVF hydration, renally-dose medications, repeat chem panel   4. Hypertension  - Treat as-needed only for now    5. CAD  - He reported chest pain prior to arrival but denies that in the ED  - Troponin is mildly elevated and decreasing, not suggestive of ACS   6. Dementia  - Delirium precautions     DVT prophylaxis: sq heparin  Code Status: Full  Level of Care: Level of care: Telemetry Medical Family Communication: wife updated from ED   Disposition Plan:  Patient is from: home  Anticipated d/c is to: TBD Anticipated d/c date is: 06/03/24  Patient currently: Pending treatment of pneumonia and AKI, likely ENT consutation, and disposition planning  Consults called: None  Admission status: Inpatient    Evalene GORMAN Sprinkles, MD [facility]  05/31/2024, 2:56 AM

## 2024-05-31 NOTE — Consult Note (Signed)
 Reason for Consult: Laryngeal mass Referring Physician: Madelyne Owen LABOR, MD  Rodney Christensen is an 83 y.o. male.  HPI: Patient with significant Alzheimer's dementia, wife in the room, has had a lot of excess mucus in his throat recently but no other significant specific complaints.  Past history of lung cancer treated with chemotherapy and radiation.  Admitted, found to have pneumonia, also found to have a large laryngeal/hypopharyngeal mass, with metastatic nodes, and multiple pulmonary nodules.  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 His smoking use included cigarettes. He started smoking about 67 years ago. He has a 62.5 pack-year smoking history. He has never used smokeless tobacco. He reports current alcohol use. He reports that he does not use drugs.  Allergies:  Allergies  Allergen Reactions   Cephalexin Hives   Latanoprost Other (See Comments)    Unknown    Medications: Reviewed  Results for orders placed or performed during the hospital encounter of 05/30/24 (from the past 48 hours)  CBC     Status: Abnormal   Collection Time: 05/30/24 10:09 PM  Result Value Ref Range   WBC 9.9 4.0 - 10.5 K/uL    RBC 3.64 (L) 4.22 - 5.81 MIL/uL   Hemoglobin 10.7 (L) 13.0 - 17.0 g/dL   HCT 64.6 (L) 60.9 - 47.9 %   MCV 97.0 80.0 - 100.0 fL   MCH 29.4 26.0 - 34.0 pg   MCHC 30.3 30.0 - 36.0 g/dL   RDW 86.9 88.4 - 84.4 %   Platelets 150 150 - 400 K/uL   nRBC 0.0 0.0 - 0.2 %    Comment: Performed at [facility], [address], [place], [place] [address]  Troponin I (High Sensitivity)     Status: Abnormal   Collection Time: 05/30/24 10:09 PM  Result Value Ref Range   Troponin I (High Sensitivity) 80 (H) <18 ng/L    Comment: (NOTE) Elevated high sensitivity troponin I (hsTnI) values and significant  changes across serial measurements may suggest ACS but many other  chronic and acute conditions are known to elevate hsTnI results.  Refer to the Links section for chest pain algorithms and additional  guidance. Performed at [facility], [address] [place], [place] [address]   Comprehensive metabolic panel with GFR     Status: Abnormal   Collection Time: 05/30/24 10:09 PM  Result Value Ref Range   Sodium 146 (H) 135 - 145 mmol/L   Potassium 3.7 3.5 - 5.1 mmol/L   Chloride 111 98 - 111 mmol/L   CO2 24 22 - 32  mmol/L   Glucose, Bld 92 70 - 99 mg/dL    Comment: Glucose reference range applies only to samples taken after fasting for at least 8 hours.   BUN 39 (H) 8 - 23 mg/dL   Creatinine, Ser 8.26 (H) 0.61 - 1.24 mg/dL   Calcium 9.0 8.9 - 89.6 mg/dL   Total Protein 7.1 6.5 - 8.1 g/dL   Albumin 3.2 (L) 3.5 - 5.0 g/dL   AST 27 15 - 41 U/L   ALT 15 0 - 44 U/L   Alkaline Phosphatase 79 38 - 126 U/L   Total Bilirubin 0.5 0.0 - 1.2 mg/dL   GFR, Estimated 38 (L) >60 mL/min    Comment: (NOTE) Calculated using the CKD-EPI Creatinine Equation (2021)    Anion gap 11 5 - 15    Comment: Performed at [facility], [address], [place], [place] [address]  Lipase, blood     Status: None   Collection Time: 05/30/24 10:09 PM  Result Value Ref Range   Lipase 39 11 - 51 U/L     Comment: Performed at [facility], [address], [place], [place] [address]  Urinalysis, Routine w reflex microscopic -Urine, Clean Catch     Status: None   Collection Time: 05/30/24 10:16 PM  Result Value Ref Range   Color, Urine YELLOW YELLOW   APPearance CLEAR CLEAR   Specific Gravity, Urine 1.012 1.005 - 1.030   pH 5.0 5.0 - 8.0   Glucose, UA NEGATIVE NEGATIVE mg/dL   Hgb urine dipstick NEGATIVE NEGATIVE   Bilirubin Urine NEGATIVE NEGATIVE   Ketones, ur NEGATIVE NEGATIVE mg/dL   Protein, ur NEGATIVE NEGATIVE mg/dL   Nitrite NEGATIVE NEGATIVE   Leukocytes,Ua NEGATIVE NEGATIVE    Comment: Performed at [place] [facility], [address], [place], [place] [address]  I-Stat Lactic Acid     Status: Abnormal   Collection Time: 05/30/24 10:20 PM  Result Value Ref Range   Lactic Acid, Venous 2.0 (HH) 0.5 - 1.9 mmol/L  I-stat chem 8, ED     Status: Abnormal   Collection Time: 05/30/24 10:20 PM  Result Value Ref Range   Sodium 146 (H) 135 - 145 mmol/L   Potassium 3.6 3.5 - 5.1 mmol/L   Chloride 109 98 - 111 mmol/L   BUN 37 (H) 8 - 23 mg/dL   Creatinine, Ser 8.19 (H) 0.61 - 1.24 mg/dL   Glucose, Bld 95 70 - 99 mg/dL    Comment: Glucose reference range applies only to samples taken after fasting for at least 8 hours.   Calcium, Ion 1.17 1.15 - 1.40 mmol/L   TCO2 24 22 - 32 mmol/L   Hemoglobin 11.6 (L) 13.0 - 17.0 g/dL   HCT 65.9 (L) 60.9 - 47.9 %  I-Stat venous blood gas, ED     Status: Abnormal   Collection Time: 05/30/24 10:20 PM  Result Value Ref Range   pH, Ven 7.401 7.25 - 7.43   pCO2, Ven 40.7 (L) 44 - 60 mmHg   pO2, Ven 156 (H) 32 - 45 mmHg   Bicarbonate 25.2 20.0 - 28.0 mmol/L   TCO2 26 22 - 32 mmol/L   O2 Saturation 99 %   Acid-Base Excess 0.0 0.0 - 2.0 mmol/L   Sodium 147 (H) 135 - 145 mmol/L   Potassium 3.6 3.5 - 5.1 mmol/L   Calcium, Ion 1.16 1.15 - 1.40 mmol/L   HCT 33.0 (L) 39.0 - 52.0 %   Hemoglobin  11.2 (L) 13.0 - 17.0 g/dL   Sample type VENOUS    Culture, blood (routine x 2)     Status: None (Preliminary result)   Collection Time: 05/30/24 11:11 PM   Specimen: BLOOD  Result Value Ref Range   Specimen Description BLOOD RIGHT ARM    Special Requests      BOTTLES DRAWN AEROBIC ONLY Blood Culture results may not be optimal due to an inadequate volume of blood received in culture bottles   Culture      NO GROWTH < 12 HOURS Performed at [facility], [address] [place], [place] [address]    Report Status PENDING   Protime-INR     Status: None   Collection Time: 05/30/24 11:13 PM  Result Value Ref Range   Prothrombin Time 14.4 11.4 - 15.2 seconds   INR 1.1 0.8 - 1.2    Comment: (NOTE) INR goal varies based on device and disease states. Performed at [facility], [address] [place], [place] [address]   Troponin I (High Sensitivity)     Status: Abnormal   Collection Time: 05/30/24 11:13 PM  Result Value Ref Range   Troponin I (High Sensitivity) 72 (H) <18 ng/L    Comment: (NOTE) Elevated high sensitivity troponin I (hsTnI) values and significant  changes across serial measurements may suggest ACS but many other  chronic and acute conditions are known to elevate hsTnI results.  Refer to the Links section for chest pain algorithms and additional  guidance. Performed at [facility], [address] [place], [place] [address]   Culture, blood (routine x 2)     Status: None (Preliminary result)   Collection Time: 05/31/24 12:23 AM   Specimen: BLOOD  Result Value Ref Range   Specimen Description BLOOD BLOOD RIGHT HAND    Special Requests      BOTTLES DRAWN AEROBIC AND ANAEROBIC Blood Culture adequate volume   Culture      NO GROWTH < 12 HOURS Performed at [facility], [address], [place], [place] [address]    Report Status PENDING   I-Stat Lactic Acid     Status: None   Collection Time: 05/31/24 12:25 AM  Result Value Ref Range   Lactic Acid, Venous 1.3 0.5 - 1.9 mmol/L  Basic  metabolic panel     Status: Abnormal   Collection Time: 05/31/24  4:09 AM  Result Value Ref Range   Sodium 146 (H) 135 - 145 mmol/L   Potassium 3.7 3.5 - 5.1 mmol/L   Chloride 113 (H) 98 - 111 mmol/L   CO2 21 (L) 22 - 32 mmol/L   Glucose, Bld 91 70 - 99 mg/dL    Comment: Glucose reference range applies only to samples taken after fasting for at least 8 hours.   BUN 36 (H) 8 - 23 mg/dL   Creatinine, Ser 8.55 (H) 0.61 - 1.24 mg/dL   Calcium 8.9 8.9 - 89.6 mg/dL   GFR, Estimated 48 (L) >60 mL/min    Comment: (NOTE) Calculated using the CKD-EPI Creatinine Equation (2021)    Anion gap 12 5 - 15    Comment: Performed at [facility], [address], [place], [place] [address]  Magnesium     Status: None   Collection Time: 05/31/24  4:09 AM  Result Value Ref Range   Magnesium 2.4 1.7 - 2.4 mg/dL    Comment: Performed at [facility], [address] [place], [place] [address]  CBC     Status: Abnormal   Collection Time: 05/31/24  4:09 AM  Result Value Ref Range   WBC 11.9 (H) 4.0 - 10.5 K/uL   RBC 3.68 (L) 4.22 - 5.81 MIL/uL   Hemoglobin 11.0 (L) 13.0 - 17.0 g/dL   HCT 64.7 (L) 60.9 - 47.9 %   MCV 95.7 80.0 - 100.0 fL   MCH 29.9 26.0 - 34.0 pg   MCHC 31.3 30.0 - 36.0 g/dL   RDW 86.9 88.4 - 84.4 %   Platelets 139 (L) 150 - 400 K/uL   nRBC 0.0 0.0 - 0.2 %    Comment: Performed at [facility], [address], [place], [place] [address]  Procalcitonin     Status: None   Collection Time: 05/31/24  4:09 AM  Result Value Ref Range   Procalcitonin 0.10 ng/mL    Comment:        Interpretation: PCT (Procalcitonin) <= 0.5 ng/mL: Systemic infection (sepsis) is not likely. Local bacterial infection is possible. (NOTE)       Sepsis PCT Algorithm           Lower Respiratory Tract                                      Infection PCT Algorithm    ----------------------------     ----------------------------         PCT < 0.25 ng/mL                PCT < 0.10 ng/mL           Strongly encourage             Strongly discourage   discontinuation of antibiotics    initiation of antibiotics    ----------------------------     -----------------------------       PCT 0.25 - 0.50 ng/mL            PCT 0.10 - 0.25 ng/mL               OR       >80% decrease in PCT            Discourage initiation of                                            antibiotics      Encourage discontinuation           of antibiotics    ----------------------------     -----------------------------         PCT >= 0.50 ng/mL              PCT 0.26 - 0.50 ng/mL               AND        <80% decrease in PCT             Encourage initiation of                                             antibiotics       Encourage continuation  of antibiotics    ----------------------------     -----------------------------        PCT >= 0.50 ng/mL                  PCT > 0.50 ng/mL               AND         increase in PCT                  Strongly encourage                                      initiation of antibiotics    Strongly encourage escalation           of antibiotics                                     -----------------------------                                           PCT <= 0.25 ng/mL                                                 OR                                        > 80% decrease in PCT                                      Discontinue / Do not initiate                                             antibiotics  Performed at [facility], [address], [place], [place] [address]     CT Soft Tissue Neck W Contrast Result Date: 05/31/2024 CLINICAL DATA:  abnormal non con neck CT. EXAM: CT NECK WITH CONTRAST TECHNIQUE: Multidetector CT imaging of the neck was performed using the standard protocol following the bolus administration of intravenous contrast. RADIATION DOSE REDUCTION: This exam was performed according to the departmental dose-optimization program which includes  automated exposure control, adjustment of the mA and/or kV according to patient size and/or use of iterative reconstruction technique. CONTRAST:  75mL OMNIPAQUE  IOHEXOL  350 MG/ML SOLN COMPARISON:  None Available. FINDINGS: Pharynx and larynx: Bulky/large 4.1 x 3.5 x 3.7 cm glottic laryngeal and hypopharyngeal mass with both supraglottic and subglottic extension clips. The mass abuts the arytenoid cartilage and extends anteriorly to involve the left true cord with extension through and erosion of the he invasion of the paraglottic fat and invasion through the thyroid  cartilage. Salivary glands: Absent left submandibular gland. Right submandibular gland and parotid glands are within normal limits. Clips in the left submandibular region and left neck. Thyroid : Subcentimeter  thyroid  nodules which do not require further imaging follow-up (ref: J Am Coll Radiol. 2015 Feb;12(2): 143-50). Lymph nodes: Enlarged, bulky 2.9 cm left level II Clips in the left neck. Multiple additional surrounding enlarged left level II and rounded level III lymph nodes. Vascular: Severe stenosis of the left internal carotid artery proximally, not well characterized on this study. Extensive atherosclerosis. Limited intracranial: Negative. Visualized orbits: Negative. Mastoids and visualized paranasal sinuses: Multiple retention cysts. No mastoid effusions. Skeleton: Further evaluated on same day CT of the cervical spine. Upper chest: Evaluated on same day CT of the chest/abdomen/pelvis. IMPRESSION: 1. Large 4.1 cm left glottic laryngeal and hypopharyngeal mass with both supraglottic and subglottic extension, involvement of the left true cord and paraglottic fat, and invasion through the left thyroid  cartilage. Recommend ENT consultation. 2. Bulky enlarged 2.9 cm left level II, compatible with nodal metastasis. Multiple additional surrounding enlarged left level II and rounded level III lymph nodes, suspicious for additional metastases. A PET-CT  could provide more definitive nodal staging if clinically warranted. 3. Severe stenosis of the left internal carotid artery proximally, not well characterized on this study. A CTA of the neck could further evaluate/quantify. Preliminary findings discussed with Dr. Haze via telephone at 1:22 a.m. Electronically Signed   By: Gilmore GORMAN Molt M.D.   On: 05/31/2024 01:26   CT CHEST ABDOMEN PELVIS W CONTRAST Result Date: 05/31/2024 CLINICAL DATA:  Sepsis. Suspicious pulmonary nodule seen on CT cervical spine earlier today EXAM: CT CHEST, ABDOMEN, AND PELVIS WITH CONTRAST TECHNIQUE: Multidetector CT imaging of the chest, abdomen and pelvis was performed following the standard protocol during bolus administration of intravenous contrast. RADIATION DOSE REDUCTION: This exam was performed according to the departmental dose-optimization program which includes automated exposure control, adjustment of the mA and/or kV according to patient size and/or use of iterative reconstruction technique. CONTRAST:  75mL OMNIPAQUE  IOHEXOL  350 MG/ML SOLN COMPARISON:  CT cervical spine earlier today; CT chest 06/20/2020; PET/CT 02/18/2011 FINDINGS: CT CHEST FINDINGS Cardiovascular: Coronary artery and aortic atherosclerotic calcification. No pericardial effusion. Normal caliber thoracic aorta. CABG. Mediastinum/Nodes: Wispy secretions in the trachea. Small hiatal hernia. No thoracic adenopathy. Left-sided soft tissue nodule posterior to the hyoid bone. See dedicated CT neck for details. Lungs/Pleura: Numerous bilateral pulmonary nodules suspicious for metastatic disease. The largest on the right is in the right lower lobe and measures 2.2 x 2.3 cm (series 5/image 104) the largest in the left is in the left upper lobe and measures 2.1 x 1.3 cm (5/74). Unchanged fullness in the left infrahilar region due to postradiation changes. Consolidation and bronchiectatic changes in the treated left lung following left lower lobectomy. Small  left-sided pleural effusion. Small left pleural effusion. No pneumothorax. Musculoskeletal: No acute fracture or destructive osseous lesion. Sternotomy. CT ABDOMEN PELVIS FINDINGS Hepatobiliary: Cholelithiasis. No evidence of acute cholecystitis. No biliary dilation. No focal hepatic lesion. Pancreas: Unremarkable. Spleen: Unremarkable. Adrenals/Urinary Tract: New 1.7 x 1.3 cm nodule in the right adrenal gland concerning for metastasis. Unremarkable left adrenal gland. Cortical renal scarring both kidneys. No urinary calculi or hydronephrosis. Unremarkable bladder. Stomach/Bowel: Normal caliber large and small bowel. No bowel wall thickening. Small hiatal hernia. Stomach is otherwise within normal limits. Vascular/Lymphatic: Advanced aortic atherosclerotic calcification. 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 No lymphadenopathy. Reproductive: Enlarged prostate indenting the floor of the bladder. Other: No free intraperitoneal fluid or air. Musculoskeletal: No acute fracture. Lytic lesion with surrounding sclerosis and cortical breakthrough and extraosseous soft tissue component in the L4 vertebral body consistent with  metastasis. This measures 2.2 x 2.0 cm. IMPRESSION: 1. Numerous bilateral pulmonary nodules consistent with metastatic disease. 2. New 1.7 cm nodule in the right adrenal gland concerning for metastasis. 3. Lytic lesion with surrounding sclerosis and cortical breakthrough and extraosseous soft tissue component in the L4 vertebral body consistent with metastasis. 4. Chronic post surgical and radiation changes to the left lower lung. Small left pleural effusion. 5. Soft tissue nodule in the left neck at the level of the hyoid bone suspicious for primary malignancy or metastasis. See separate report of CT neck for details. 6. Cholelithiasis. 7. 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 8. Aortic Atherosclerosis (ICD10-I70.0). Electronically Signed   By: Norman Gatlin M.D.   On: 05/31/2024 00:29    CT Cervical Spine Wo Contrast Result Date: 05/30/2024 CLINICAL DATA:  Neck trauma (Age >= 65y) EXAM: CT CERVICAL SPINE WITHOUT CONTRAST TECHNIQUE: Multidetector CT imaging of the cervical spine was performed without intravenous contrast. Multiplanar CT image reconstructions were also generated. RADIATION DOSE REDUCTION: This exam was performed according to the departmental dose-optimization program which includes automated exposure control, adjustment of the mA and/or kV according to patient size and/or use of iterative reconstruction technique. COMPARISON:  None Available. FINDINGS: Alignment: Normal. Skull base and vertebrae: No acute fracture. Vertebral body heights are maintained. The dens and skull base are intact. Soft tissues and spinal canal: No prevertebral fluid or swelling. No visible canal hematoma. Disc levels: Multilevel degenerative change throughout the cervical spine. Disc space narrowing and spurring at multiple levels with facet hypertrophy. Upper chest: Postsurgical change at the carotid bulb with asymmetric rounded soft tissue density, series 5, image 54. Abnormal soft tissue density in the left neck just posterior to the hyoid bone causing mass effect on the airway, series 5, image 78. There are multiple apical pulmonary nodules, for example in the left upper lobe measuring 6 mm series 4, image 99. Right upper lobe only partially included in the field of view 8 mm series 4, image 102. Nodules are new from 2021 chest CT. Other: Right carotid calcifications. IMPRESSION: 1. No acute fracture or subluxation of the cervical spine. 2. Multilevel degenerative change. 3. Postsurgical change at the carotid bulb with asymmetric rounded soft tissue density. Abnormal soft tissue density in the left neck just posterior to the hyoid bone causing mass effect on the airway. Findings may represent adenopathy. Recommend contrast-enhanced neck CT for further assessment. 4. Multiple apical pulmonary nodules,  new from 2021 chest CT, suspicious for metastatic disease. Chest CT recommended for complete assessment. Electronically Signed   By: Andrea Gasman M.D.   On: 05/30/2024 23:13   DG Chest Port 1 View Result Date: 05/30/2024 CLINICAL DATA:  Chest pain EXAM: PORTABLE CHEST 1 VIEW COMPARISON:  05/30/2024 FINDINGS: Stable small laterally loculated left pleural effusion. Lung volumes are small and pulmonary insufflation has diminished since prior examination. Resultant vascular crowding at the hila. No confluent pulmonary infiltrate. No pneumothorax. No pleural effusion on the right. Coronary artery bypass grafting has been performed. Cardiac size within normal limits. No acute bone abnormality IMPRESSION: 1. Pulmonary hypoinflation. 2. Stable small laterally loculated left pleural effusion. Electronically Signed   By: Dorethia Molt M.D.   On: 05/30/2024 23:11   CT Head Wo Contrast Result Date: 05/30/2024 CLINICAL DATA:  Head trauma EXAM: CT HEAD WITHOUT CONTRAST TECHNIQUE: Contiguous axial images were obtained from the base of the skull through the vertex without intravenous contrast. RADIATION DOSE REDUCTION: This exam was performed according to the departmental dose-optimization  program which includes automated exposure control, adjustment of the mA and/or kV according to patient size and/or use of iterative reconstruction technique. COMPARISON:  MRI head 09/01/2021 and CT head 04/27/2006 FINDINGS: Brain: No intracranial hemorrhage, mass effect, or evidence of acute infarct. No hydrocephalus. No extra-axial fluid collection. Generalized cerebral atrophy and chronic small vessel ischemic disease. Vascular: No hyperdense vessel. Intracranial arterial calcification. Skull: No fracture or focal lesion. Sinuses/Orbits: No acute finding. Other: None. IMPRESSION: No acute intracranial abnormality. Electronically Signed   By: Norman Gatlin M.D.   On: 05/30/2024 23:08   DG Chest 2 View Result Date:  05/30/2024 CLINICAL DATA:  Remote history left lower lobectomy for carcinoma with post treatment changes. Presents with chest pain. Fell yesterday but did not seek medical attention. EXAM: CHEST - 2 VIEW COMPARISON:  Chest CT with contrast 06/20/2020 FINDINGS: The lungs are mildly emphysematous. Some volume loss again noted on the left with left lower lobectomy. There are post treatment changes in the dorsal left mid lung best seen on the lateral. There is increased opacity is suspected in the left base concerning for pneumonia or aspiration. The remaining lungs are clear. No substantial pleural effusion is seen. Stable mild cardiomegaly with CABG changes. Central vessel markings are normal caliber. Stable mediastinum with aortic atherosclerosis. Osteopenia and bridging enthesopathy thoracic spine with no new osseous findings. IMPRESSION: 1. Increased opacity in the left base concerning for pneumonia or aspiration. 2. Emphysema, left lower lobectomy and post treatment changes 3. Stable mild cardiomegaly with CABG changes. 4. Aortic atherosclerosis. 5. Osteopenia and bridging enthesopathy thoracic spine. Electronically Signed   By: Francis Quam M.D.   On: 05/30/2024 22:11    MND:Wzhjupcz except as listed in admit H&P  Blood pressure (!) 177/83, pulse 85, temperature 99.1 F (37.3 C), temperature source Oral, resp. rate 15, SpO2 99%.  PHYSICAL EXAM: Overall appearance:  Healthy appearing, in no distress, breathing well, voice is relatively normal but he is obviously demented and unable to answer any questions with meaningful responses. Head:  Normocephalic, atraumatic. Ears: External ears look healthy. Nose: External nose is healthy in appearance. Internal nasal exam free of any lesions or obstruction. Oral Cavity/Pharynx:  There are no mucosal lesions or masses identified. Larynx/Hypopharynx: Fiberoptic laryngoscopy attempted but not successful due to the dementia and difficulty with  cooperation. Neuro: Significant dementia.  Otherwise no identifiable neurologic deficits. Neck: Large firm left level 2/3 mass.  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  Evidence of possible pulmonary metastases as well.  I discussed with the wife that typically aggressive chemotherapy with radiation would be the standard or possibly surgical resection with laryngopharyngectomy and reconstruction and neck dissections.  Given his overall poor status and dementia, as well as the possibility of distant metastatic disease, surgery would certainly not be a reasonable option in this case.  Palliative chemo and/or radiation might be indicated.  Will discuss with oncology team.  At this time there is no evidence of airway obstruction.  G30.9 J18.9 J39.2 C77.0    Medical Decision Making: #/Complex Problems: 4  Data Reviewed:4  Management:4 (1-Straightforward, 2-Low, 3-Moderate, 4-High)   Ida Loader 05/31/2024, 2:08 PM

## 2024-05-31 NOTE — ED Notes (Signed)
 CCMD called.

## 2024-05-31 NOTE — ED Notes (Signed)
Courtesy call done.

## 2024-05-31 NOTE — ED Notes (Signed)
 Sent down 2 golds, light green, and lavender for AM labs

## 2024-05-31 NOTE — ED Notes (Signed)
 CCMD called and notifed

## 2024-06-01 ENCOUNTER — Other Ambulatory Visit: Payer: Self-pay

## 2024-06-01 ENCOUNTER — Ambulatory Visit
Admission: RE | Admit: 2024-06-01 | Discharge: 2024-06-01 | Disposition: A | Discharge status: Home / Self Care | Source: Ambulatory Visit | Attending: Radiation Oncology | Admitting: Radiation Oncology

## 2024-06-01 ENCOUNTER — Inpatient Hospital Stay (HOSPITAL_COMMUNITY)

## 2024-06-01 ENCOUNTER — Ambulatory Visit
Admit: 2024-06-01 | Discharge: 2024-06-01 | Disposition: A | Discharge status: Home / Self Care | Attending: Radiation Oncology | Admitting: Radiation Oncology

## 2024-06-01 DIAGNOSIS — C320 Malignant neoplasm of glottis: Secondary | ICD-10-CM | POA: Diagnosis not present

## 2024-06-01 LAB — CBC
HCT: 32.9 % — ABNORMAL LOW (ref 39.0–52.0)
Hemoglobin: 10.6 g/dL — ABNORMAL LOW (ref 13.0–17.0)
MCH: 30.1 pg (ref 26.0–34.0)
MCHC: 32.2 g/dL (ref 30.0–36.0)
MCV: 93.5 fL (ref 80.0–100.0)
Platelets: 149 10*3/uL — ABNORMAL LOW (ref 150–400)
RBC: 3.52 MIL/uL — ABNORMAL LOW (ref 4.22–5.81)
RDW: 12.9 % (ref 11.5–15.5)
WBC: 10.2 10*3/uL (ref 4.0–10.5)
nRBC: 0 % (ref 0.0–0.2)

## 2024-06-01 LAB — BASIC METABOLIC PANEL WITH GFR
Anion gap: 11 (ref 5–15)
BUN: 22 mg/dL (ref 8–23)
CO2: 25 mmol/L (ref 22–32)
Calcium: 8.9 mg/dL (ref 8.9–10.3)
Chloride: 109 mmol/L (ref 98–111)
Creatinine, Ser: 1.25 mg/dL — ABNORMAL HIGH (ref 0.61–1.24)
GFR, Estimated: 56 mL/min — ABNORMAL LOW (ref >60)
Glucose, Bld: 102 mg/dL — ABNORMAL HIGH (ref 70–99)
Potassium: 3.4 mmol/L — ABNORMAL LOW (ref 3.5–5.1)
Sodium: 145 mmol/L (ref 135–145)

## 2024-06-01 MED ORDER — POTASSIUM CHLORIDE 10 MEQ/100ML IV SOLN
10.0000 meq | INTRAVENOUS | Status: AC
  Filled 2024-06-01 (×2): qty 100

## 2024-06-01 MED ORDER — POTASSIUM CHLORIDE 10 MEQ/100ML IV SOLN
10.0000 meq | INTRAVENOUS | Status: AC
  Administered 2024-06-01 (×3): 10 meq via INTRAVENOUS
  Filled 2024-06-01 (×4): qty 100

## 2024-06-01 MED ORDER — DEXTROSE 5 % IV SOLN: INTRAVENOUS | Status: AC

## 2024-06-01 MED ORDER — HYDRALAZINE HCL 20 MG/ML IJ SOLN
10.0000 mg | Freq: Four times a day (QID) | INTRAMUSCULAR | Status: DC | PRN
  Administered 2024-06-01 – 2024-06-03 (×3): 10 mg via INTRAVENOUS
  Filled 2024-06-01 (×2): qty 1

## 2024-06-01 NOTE — Progress Notes (Signed)
 Radiation Oncology         [phone] ________________________________  Initial inpatient Consultation  Same Day CT Simulation  Name: Rodney Christensen MRN: [id number]  Date of Service: 05/30/2024 DOB: 1941-01-08  RR:[id number], Lamarr RAMAN, MD  No ref. 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 Pneumonia of left upper lobe due to infectious organism  J18.9     2. Metastatic malignant neoplasm, unspecified site (HCC)  C79.9     3. AKI (acute kidney injury) (HCC)  N17.9       HISTORY OF PRESENT ILLNESS: Rodney Christensen is a 83 y.o. male seen at the request of Dr. Loader.  He presented to the ED on 05/30/24 with recent falls, chest heaviness and failure to thrive.  He has a complex history which includes Alzheimer's, CAD with bypass, carotid endarterectomy, HTN, HLD, BPH, macular degeneration, lung cancer with lobectomy in remission since 2012.  CT of the neck on 05/30/24 showed a bulky/large 4.1 x 3.5 x 3.7 cm glottic laryngeal and hypopharyngeal mass with both supraglottic and subglottic extension clips. The mass abuts the arytenoid cartilage and extends anteriorly to involve the left true cord with extension through and erosion of the he invasion of the paraglottic fat and invasion through the thyroid  cartilage.   In addition the neck CT showed left neck adenopathy with a bulky 2.9 cm left level II Clips in the left neck. Multiple additional surrounding enlarged left level II and rounded level III lymph nodes.   CT Chest/Abd/Pelvis also showed numerous bilateral pulmonary nodules suspicious for metastatic disease.  Also seen was a new 1.7 cm nodule in the right adrenal gland concerning for metastasis.  Also noted was a lytic lesion with surrounding sclerosis and  cortical breakthrough and extraosseous soft tissue component in the L4 vertebral body consistent with metastasis.   In addition, there appears to be a destructive left sacral metastasis.   He has been referred today for consideration of possible palliative radiotherapy to the neck mass.  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 Pre-Op Chemo-RT using weekly Carbo-Taxol and 45 Gy through 04/08/11 followed by left lower lobectomy with mediastinal lymph node dissection 06/11/11 with no evidence for residual disease. Displayed is the 3-field radiation set-up    PAST MEDICAL HISTORY:  Past Medical History:  Diagnosis Date   BPH (benign prostatic hyperplasia)    CAD, NATIVE VESSEL 03/22/2010   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   Diverticulosis    History of radiation therapy 03/18/11 to 04/19/11   lower left lung   HYPERLIPIDEMIA-MIXED 03/17/2009   HYPERTENSION, UNSPECIFIED 03/17/2009   Macular degeneration    Skin cancer    melanoma-nose   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       PAST SURGICAL HISTORY: Past Surgical History:  Procedure Laterality Date   BRONCHOSCOPY  06/11/11   [facility]   CAROTID ENDARTERECTOMY     CORONARY ARTERY BYPASS GRAFT  10/10/2004   x5   Lt VATS,Lt thoacotomy,Lt lower lobectomy with  mediastinal node  dissection  06/11/11   [facility]    FAMILY HISTORY:  Family History  Problem Relation Age of Onset  Stroke Mother     SOCIAL HISTORY:  Social History   Socioeconomic History   Marital status: Married    Spouse name: Not on file   Number of children: 2   Years of education: 14   Highest education level: Not on file  Occupational History   Not on file  Tobacco Use   Smoking status: Former    Current packs/day: 0.00    Average packs/day: 2.5 packs/day for 25.0 years (62.5 ttl pk-yrs)    Types: Cigarettes    Start date: 09/14/1956     Quit date: 09/14/1981    Years since quitting: 42.7   Smokeless tobacco: Never  Vaping Use   Vaping status: Never Used  Substance and Sexual Activity   Alcohol use: Yes    Comment: occassional   Drug use: No   Sexual activity: Not on file  Other Topics Concern   Not on file  Social History Narrative   Right handed   Drinks caffeine   One story home   Retired   Lives with wife   Social Drivers of Corporate investment banker Strain: Not on file  Food Insecurity: No Food Insecurity (05/31/2024)   Hunger Vital Sign    Worried About Running Out of Food in the Last Year: Never true    Ran Out of Food in the Last Year: Never true  Transportation Needs: No Transportation Needs (05/31/2024)   PRAPARE - Administrator, Civil Service (Medical): No    Lack of Transportation (Non-Medical): No  Physical Activity: Not on file  Stress: Not on file  Social Connections: Socially Isolated (05/31/2024)   Social Connection and Isolation Panel    Frequency of Communication with Friends and Family: Never    Frequency of Social Gatherings with Friends and Family: Never    Attends Religious Services: Never    Database administrator or Organizations: No    Attends Banker Meetings: Never    Marital Status: Married  Catering manager Violence: Not At Risk (05/31/2024)   Humiliation, Afraid, Rape, and Kick questionnaire    Fear of Current or Ex-Partner: No    Emotionally Abused: No    Physically Abused: No    Sexually Abused: No    ALLERGIES: Cephalexin and Latanoprost  MEDICATIONS:  Current Facility-Administered Medications  Medication Dose Route Frequency Provider Last Rate Last Admin   acetaminophen (TYLENOL) tablet 650 mg  650 mg Oral Q6H PRN Opyd, Evalene RAMAN, MD       Or   acetaminophen (TYLENOL) suppository 650 mg  650 mg Rectal Q6H PRN Opyd, Timothy S, MD       Ampicillin-Sulbactam (UNASYN) 3 g in sodium chloride  0.9 % 100 mL IVPB  3 g Intravenous Q8H Opyd,  Timothy S, MD 200 mL/hr at 06/01/24 0551 3 g at 06/01/24 0551   dextrose 5 % solution   Intravenous Continuous Regalado, Belkys A, MD       fentaNYL (SUBLIMAZE) injection 12.5 mcg  12.5 mcg Intravenous Q2H PRN Regalado, Belkys A, MD       heparin injection 5,000 Units  5,000 Units Subcutaneous Q8H Opyd, Timothy S, MD   5,000 Units at 06/01/24 1402   hydrALAZINE (APRESOLINE) injection 5 mg  5 mg Intravenous Q6H PRN Regalado, Belkys A, MD   5 mg at 05/31/24 0936   ondansetron (ZOFRAN) tablet 4 mg  4 mg Oral Q6H PRN Opyd, Evalene RAMAN, MD       Or  ondansetron (ZOFRAN) injection 4 mg  4 mg Intravenous Q6H PRN Opyd, Timothy S, MD       potassium chloride 10 mEq in 100 mL IVPB  10 mEq Intravenous Q1 Hr x 3 Regalado, Belkys A, MD       sodium chloride  flush (NS) 0.9 % injection 3 mL  3 mL Intravenous Q12H Opyd, Evalene RAMAN, MD   3 mL at 05/31/24 2008    REVIEW OF SYSTEMS:  On review of systems, the HPI covers the highlights, and otherwise complete review of systems is obtained and is otherwise negative.    PHYSICAL EXAM:  Wt Readings from Last 3 Encounters:  09/18/23 171 lb (77.6 kg)  03/19/23 170 lb (77.1 kg)  09/17/22 178 lb (80.7 kg)   Temp Readings from Last 3 Encounters:  06/01/24 98.1 F (36.7 C)  06/22/20 97.9 F (36.6 C) (Temporal)  06/23/19 98.2 F (36.8 C) (Oral)   BP Readings from Last 3 Encounters:  06/01/24 (!) 121/51  03/25/24 (!) 117/58  09/18/23 101/61   Pulse Readings from Last 3 Encounters:  06/01/24 69  03/25/24 78  09/18/23 (!) 59   Pain Assessment Pain Score: Asleep/10  In general this is a ill appearing man in no acute distress. He is alert and oriented.  Respiratory effort is unlabored.  He does not have visible pain  KPS = 70  100 - Normal; no complaints; no evidence of disease. 90   - Able to carry on normal activity; minor signs or symptoms of disease. 80   - Normal activity with effort; some signs or symptoms of disease. 69   - Cares for self; unable  to carry on normal activity or to do active work. 60   - Requires occasional assistance, but is able to care for most of his personal needs. 50   - Requires considerable assistance and frequent medical care. 40   - Disabled; requires special care and assistance. 30   - Severely disabled; hospital admission is indicated although death not imminent. 20   - Very sick; hospital admission necessary; active supportive treatment necessary. 10   - Moribund; fatal processes progressing rapidly. 0     - Dead  Karnofsky DA, Abelmann WH, Craver LS and Burchenal [facility] [phone]) The use of the nitrogen mustards in the palliative treatment of carcinoma: with particular reference to bronchogenic carcinoma Cancer 1 [phone]  LABORATORY DATA:  Lab Results  Component Value Date   WBC 10.2 06/01/2024   HGB 10.6 (L) 06/01/2024   HCT 32.9 (L) 06/01/2024   MCV 93.5 06/01/2024   PLT 149 (L) 06/01/2024   Lab Results  Component Value Date   NA 145 06/01/2024   K 3.4 (L) 06/01/2024   CL 109 06/01/2024   CO2 25 06/01/2024   Lab Results  Component Value Date   ALT 15 05/30/2024   AST 27 05/30/2024   ALKPHOS 79 05/30/2024   BILITOT 0.5 05/30/2024     RADIOGRAPHY: DG Swallowing Func-Speech Pathology Result Date: 06/01/2024 Table formatting from the original result was not included. Modified Barium Swallow Study Patient Details Name: Rodney Christensen MRN: [id number] Date of Birth: October 11, 1941 Today's Date: 06/01/2024 HPI/PMH: HPI: 83 year old with past medical history significant for hypertension, BPH, dementia, non-small cell lung cancer status post chemoradiation and left lower lobe lobectomy 2012, CAD status post CABG presents with fatigue, generalized weakness, frequent fall, labored breathing and chest pain.  Symptoms started 3 weeks ago after he fell.  Patient fell again the night  of admission and was having chest discomfort.  He was brought by EMS for further evaluation.  Found to have large Left glottic, laryngeal, and  hypopharyngeal mass, bulky adenopathy, numerous bilateral pulmonary nodules, right adrenal gland nodule, lytic lesion involving the L4 vertebral body, and left lower lobe consolidation. Clinical Impression: Pt presents with a moderate-severe oropharyngeal dysphagia per results of MBSS completed today.  A safe oral diet cannot be recommended at this time. Oral deficits resulted in posterior spillage of liquids to the level of the pyriform sinuses or into the laryngeal vestibule before the swallow and inefficient oral transit with min-mod BOT residue observed following the majority of trials. Pharyngeal deficits included reduced hyolaryngeal elevation/excursion, absent epiglottic inversion, reduced velum seal with partial nasal regurgitation of thin liquids, reduced laryngeal vestibule closure and reduced pharyngeal stripping. Findings: -There was aspiration with ejection and suspected x1 instance of aspiration without ejection of thin liquid residue after the swallow. Pt's shoulder position limited view of airway. -There was shallow and deep penetration with and without ejection of nectar-thick liquids during and after the swallow. Aspiration was not observed but delayed aspiration is expected. -There was shallow, transient penetration of honey-thick liquids and pudding during/after the swallow. Aspiration was not observed but delayed aspiration is expected. -Pt did appear to be sensate to majority or all of penetration/aspiration events with throat clear or cough response. -The amount of pharyngeal residue increased with increased viscosity resulting in large pharyngeal residue post pudding trial. Education: SLP followed up at bedside and discussed MBSS results at length with pt's wife. Pt was present though anticipate poor-no comprehension of this education. We discussed options of comfort diet vs conservative nutrition measures. Pt's wife stated plan to speak with oncologist about pt's prognosis and intervention  option. We discussed plan to continue NPO except ice chips today with SLP follow up as goals of care are decided. Pt's wife verbalized good understanding of all education and all questions pertaining to swallowing were answered to her satisfaction today. Plan: SLP will follow up for support and education pending pt's goals of care.  Factors that may increase risk of adverse event in presence of aspiration Noe & Lianne 2021): Factors that may increase risk of adverse event in presence of aspiration Noe & Lianne 2021): Frail or deconditioned; Dependence for feeding and/or oral hygiene; Reduced cognitive function Recommendations/Plan: Swallowing Evaluation Recommendations Swallowing Evaluation Recommendations Recommendations: NPO; Ice chips PRN after oral care Medication Administration: Via alternative means Supervision: Full assist for feeding Postural changes: Stay upright 30-60 min after meals; Position pt fully upright for meals Oral care recommendations: Oral care QID (4x/day) Recommended consults: Consider Palliative care Treatment Plan Treatment Plan Treatment recommendations: Therapy as outlined in treatment plan below Follow-up recommendations: -- (TBD pending goals of care) Functional status assessment: Patient has had a recent decline in their functional status and/or demonstrates limited ability to make significant improvements in function in a reasonable and predictable amount of time. Treatment frequency: Min 2x/week (pending goals of care) Treatment duration: 2 weeks (pending goals of care) Interventions: Patient/family education (pending goals of care) Recommendations Recommendations for follow up therapy are one component of a multi-disciplinary discharge planning process, led by the attending physician.  Recommendations may be updated based on patient status, additional functional criteria and insurance authorization. Assessment: Orofacial Exam: Orofacial Exam Oral Cavity - Dentition: --  (unable to view) Orofacial Anatomy: WFL Oral Motor/Sensory Function: Unable to test Anatomy: Anatomy: -- (known laryngeal mass) Boluses Administered: Boluses Administered Boluses Administered: Thin liquids (  Level 0); Mildly thick liquids (Level 2, nectar thick); Moderately thick liquids (Level 3, honey thick); Puree  Oral Impairment Domain: Oral Impairment Domain Lip Closure: No labial escape Tongue control during bolus hold: Posterior escape of less than half of bolus Bolus preparation/mastication: -- (not assessed) Bolus transport/lingual motion: Delayed initiation of tongue motion (oral holding) Oral residue: Residue collection on oral structures Location of oral residue : Tongue Initiation of pharyngeal swallow : Pyriform sinuses  Pharyngeal Impairment Domain: Pharyngeal Impairment Domain Soft palate elevation: Escape to nasopharynx Laryngeal elevation: Minimal superior movement of thyroid  cartilage with minimal approximation of arytenoids to epiglottic petiole Anterior hyoid excursion: Partial anterior movement Epiglottic movement: No inversion Laryngeal vestibule closure: Incomplete, narrow column air/contrast in laryngeal vestibule Pharyngeal stripping wave : Present - diminished Pharyngeal contraction (A/P view only): N/A Pharyngoesophageal segment opening: Complete distension and complete duration, no obstruction of flow Tongue base retraction: Narrow column of contrast or air between tongue base and PPW Pharyngeal residue: Majority of contrast within or on pharyngeal structures Location of pharyngeal residue: Tongue base; Pharyngeal wall; Valleculae; Diffuse (>3 areas)  Esophageal Impairment Domain: Esophageal Impairment Domain Esophageal clearance upright position: -- (not assessed) Pill: Pill Consistency administered: -- (not assessed) Penetration/Aspiration Scale Score: Penetration/Aspiration Scale Score 2.  Material enters airway, remains ABOVE vocal cords then ejected out: Mildly thick liquids (Level  2, nectar thick); Puree; Moderately thick liquids (Level 3, honey thick) 3.  Material enters airway, remains ABOVE vocal cords and not ejected out: Thin liquids (Level 0); Mildly thick liquids (Level 2, nectar thick) 4.  Material enters airway, CONTACTS cords then ejected out: Thin liquids (Level 0); Mildly thick liquids (Level 2, nectar thick) 5.  Material enters airway, CONTACTS cords and not ejected out: Thin liquids (Level 0) 6.  Material enters airway, passes BELOW cords then ejected out: Thin liquids (Level 0) 7.  Material enters airway, passes BELOW cords and not ejected out despite cough attempt by patient: Thin liquids (Level 0) Compensatory Strategies: Compensatory Strategies Compensatory strategies: Yes Straw: Ineffective Ineffective Straw: Thin liquid (Level 0); Mildly thick liquid (Level 2, nectar thick) Chin tuck: Ineffective Ineffective Chin Tuck: Thin liquid (Level 0) Liquid wash: Effective Effective Liquid Wash: Mildly thick liquid (Level 2, nectar thick) Left head turn: Ineffective Ineffective Left Head Turn: Mildly thick liquid (Level 2, nectar thick) Right head turn: Ineffective Ineffective Right Head Turn: Mildly thick liquid (Level 2, nectar thick)   General Information: Caregiver present: Yes  Diet Prior to this Study: NPO   Temperature : Normal   Respiratory Status: WFL   Supplemental O2: None (Room air)   History of Recent Intubation: No  Behavior/Cognition: Alert; Requires cueing; Confused No data recorded Baseline vocal quality/speech: -- (intermittent wet voicing noted) Volitional Cough: -- (inconsistent) Volitional Swallow: -- (inconsistent) Exam Limitations: Limited visibility (due to shoulder position) Goal Planning: Prognosis for improved oropharyngeal function: Guarded Barriers to Reach Goals: Cognitive deficits; Severity of deficits; Overall medical prognosis No data recorded Patient/Family Stated Goal: none stated Consulted and agree with results and recommendations: Patient;  Nurse; Physician; Family member/caregiver Pain: No data recorded End of Session: Start Time:SLP Start Time (ACUTE ONLY): 0930 Stop Time: SLP Stop Time (ACUTE ONLY): 1000 Start Time:SLP Start Time (ACUTE ONLY): 1009 Stop Time: SLP Stop Time (ACUTE ONLY): 1038 Time Calculation:SLP Time Calculation (min) (ACUTE ONLY): 30 min Time Calculation:SLP Time Calculation (min) (ACUTE ONLY): 29 min Charges: SLP Evaluations $ SLP Speech Visit: 1 Visit SLP Evaluations $MBS Swallow: 1 Procedure $Swallowing Treatment: 1 Procedure SLP  visit diagnosis: SLP Visit Diagnosis: Dysphagia, oropharyngeal phase (R13.12) Past Medical History: Past Medical History: Diagnosis Date  BPH (benign prostatic hyperplasia)   CAD, NATIVE VESSEL 03/22/2010  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  Diverticulosis   History of radiation therapy 03/18/11 to 04/19/11  lower left lung  HYPERLIPIDEMIA-MIXED 03/17/2009  HYPERTENSION, UNSPECIFIED 03/17/2009  Macular degeneration   Skin cancer   melanoma-nose  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HCC)   Left lower lobe  Past Surgical History: Past Surgical History: Procedure Laterality Date  BRONCHOSCOPY  06/11/11  [facility]  CAROTID ENDARTERECTOMY    CORONARY ARTERY BYPASS GRAFT  10/10/2004  x5  Lt VATS,Lt thoacotomy,Lt lower lobectomy with  mediastinal node  dissection  06/11/11  [facility] Rodney Christensen 06/01/2024, 11:33 AM  CT Soft Tissue Neck W Contrast Result Date: 05/31/2024 CLINICAL DATA:  abnormal non con neck CT. EXAM: CT NECK WITH CONTRAST TECHNIQUE: Multidetector CT imaging of the neck was performed using the standard protocol following the bolus administration of intravenous contrast. RADIATION DOSE REDUCTION: This exam was performed according to the departmental dose-optimization program which includes automated exposure control, adjustment of the mA and/or kV according to patient size and/or use of iterative reconstruction technique. CONTRAST:  75mL OMNIPAQUE  IOHEXOL  350 MG/ML SOLN COMPARISON:  None Available.  FINDINGS: Pharynx and larynx: Bulky/large 4.1 x 3.5 x 3.7 cm glottic laryngeal and hypopharyngeal mass with both supraglottic and subglottic extension clips. The mass abuts the arytenoid cartilage and extends anteriorly to involve the left true cord with extension through and erosion of the he invasion of the paraglottic fat and invasion through the thyroid  cartilage. Salivary glands: Absent left submandibular gland. Right submandibular gland and parotid glands are within normal limits. Clips in the left submandibular region and left neck. Thyroid : Subcentimeter thyroid  nodules which do not require further imaging follow-up (ref: J Am Coll Radiol. 2015 Feb;12(2): 143-50). Lymph nodes: Enlarged, bulky 2.9 cm left level II Clips in the left neck. Multiple additional surrounding enlarged left level II and rounded level III lymph nodes. Vascular: Severe stenosis of the left internal carotid artery proximally, not well characterized on this study. Extensive atherosclerosis. Limited intracranial: Negative. Visualized orbits: Negative. Mastoids and visualized paranasal sinuses: Multiple retention cysts. No mastoid effusions. Skeleton: Further evaluated on same day CT of the cervical spine. Upper chest: Evaluated on same day CT of the chest/abdomen/pelvis. IMPRESSION: 1. Large 4.1 cm left glottic laryngeal and hypopharyngeal mass with both supraglottic and subglottic extension, involvement of the left true cord and paraglottic fat, and invasion through the left thyroid  cartilage. Recommend ENT consultation. 2. Bulky enlarged 2.9 cm left level II, compatible with nodal metastasis. Multiple additional surrounding enlarged left level II and rounded level III lymph nodes, suspicious for additional metastases. A PET-CT could provide more definitive nodal staging if clinically warranted. 3. Severe stenosis of the left internal carotid artery proximally, not well characterized on this study. A CTA of the neck could further  evaluate/quantify. Preliminary findings discussed with Dr. Haze via telephone at 1:22 a.m. Electronically Signed   By: Gilmore GORMAN Molt M.D.   On: 05/31/2024 01:26   CT CHEST ABDOMEN PELVIS W CONTRAST Result Date: 05/31/2024 CLINICAL DATA:  Sepsis. Suspicious pulmonary nodule seen on CT cervical spine earlier today EXAM: CT CHEST, ABDOMEN, AND PELVIS WITH CONTRAST TECHNIQUE: Multidetector CT imaging of the chest, abdomen and pelvis was performed following the standard protocol during bolus administration of intravenous contrast. RADIATION DOSE REDUCTION: This exam was performed according to the departmental dose-optimization program which includes automated exposure  control, adjustment of the mA and/or kV according to patient size and/or use of iterative reconstruction technique. CONTRAST:  75mL OMNIPAQUE  IOHEXOL  350 MG/ML SOLN COMPARISON:  CT cervical spine earlier today; CT chest 06/20/2020; PET/CT 02/18/2011 FINDINGS: CT CHEST FINDINGS Cardiovascular: Coronary artery and aortic atherosclerotic calcification. No pericardial effusion. Normal caliber thoracic aorta. CABG. Mediastinum/Nodes: Wispy secretions in the trachea. Small hiatal hernia. No thoracic adenopathy. Left-sided soft tissue nodule posterior to the hyoid bone. See dedicated CT neck for details. Lungs/Pleura: Numerous bilateral pulmonary nodules suspicious for metastatic disease. The largest on the right is in the right lower lobe and measures 2.2 x 2.3 cm (series 5/image 104) the largest in the left is in the left upper lobe and measures 2.1 x 1.3 cm (5/74). Unchanged fullness in the left infrahilar region due to postradiation changes. Consolidation and bronchiectatic changes in the treated left lung following left lower lobectomy. Small left-sided pleural effusion. Small left pleural effusion. No pneumothorax. Musculoskeletal: No acute fracture or destructive osseous lesion. Sternotomy. CT ABDOMEN PELVIS FINDINGS Hepatobiliary: Cholelithiasis.  No evidence of acute cholecystitis. No biliary dilation. No focal hepatic lesion. Pancreas: Unremarkable. Spleen: Unremarkable. Adrenals/Urinary Tract: New 1.7 x 1.3 cm nodule in the right adrenal gland concerning for metastasis. Unremarkable left adrenal gland. Cortical renal scarring both kidneys. No urinary calculi or hydronephrosis. Unremarkable bladder. Stomach/Bowel: Normal caliber large and small bowel. No bowel wall thickening. Small hiatal hernia. Stomach is otherwise within normal limits. Vascular/Lymphatic: Advanced aortic atherosclerotic calcification. 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 No lymphadenopathy. Reproductive: Enlarged prostate indenting the floor of the bladder. Other: No free intraperitoneal fluid or air. Musculoskeletal: No acute fracture. Lytic lesion with surrounding sclerosis and cortical breakthrough and extraosseous soft tissue component in the L4 vertebral body consistent with metastasis. This measures 2.2 x 2.0 cm. IMPRESSION: 1. Numerous bilateral pulmonary nodules consistent with metastatic disease. 2. New 1.7 cm nodule in the right adrenal gland concerning for metastasis. 3. Lytic lesion with surrounding sclerosis and cortical breakthrough and extraosseous soft tissue component in the L4 vertebral body consistent with metastasis. 4. Chronic post surgical and radiation changes to the left lower lung. Small left pleural effusion. 5. Soft tissue nodule in the left neck at the level of the hyoid bone suspicious for primary malignancy or metastasis. See separate report of CT neck for details. 6. Cholelithiasis. 7. 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 8. Aortic Atherosclerosis (ICD10-I70.0). Electronically Signed   By: Norman Gatlin M.D.   On: 05/31/2024 00:29   CT Cervical Spine Wo Contrast Result Date: 05/30/2024 CLINICAL DATA:  Neck trauma (Age >= 65y) EXAM: CT CERVICAL SPINE WITHOUT CONTRAST TECHNIQUE: Multidetector CT imaging of the cervical spine was  performed without intravenous contrast. Multiplanar CT image reconstructions were also generated. RADIATION DOSE REDUCTION: This exam was performed according to the departmental dose-optimization program which includes automated exposure control, adjustment of the mA and/or kV according to patient size and/or use of iterative reconstruction technique. COMPARISON:  None Available. FINDINGS: Alignment: Normal. Skull base and vertebrae: No acute fracture. Vertebral body heights are maintained. The dens and skull base are intact. Soft tissues and spinal canal: No prevertebral fluid or swelling. No visible canal hematoma. Disc levels: Multilevel degenerative change throughout the cervical spine. Disc space narrowing and spurring at multiple levels with facet hypertrophy. Upper chest: Postsurgical change at the carotid bulb with asymmetric rounded soft tissue density, series 5, image 54. Abnormal soft tissue density in the left neck just posterior to the hyoid bone causing mass  effect on the airway, series 5, image 78. There are multiple apical pulmonary nodules, for example in the left upper lobe measuring 6 mm series 4, image 99. Right upper lobe only partially included in the field of view 8 mm series 4, image 102. Nodules are new from 2021 chest CT. Other: Right carotid calcifications. IMPRESSION: 1. No acute fracture or subluxation of the cervical spine. 2. Multilevel degenerative change. 3. Postsurgical change at the carotid bulb with asymmetric rounded soft tissue density. Abnormal soft tissue density in the left neck just posterior to the hyoid bone causing mass effect on the airway. Findings may represent adenopathy. Recommend contrast-enhanced neck CT for further assessment. 4. Multiple apical pulmonary nodules, new from 2021 chest CT, suspicious for metastatic disease. Chest CT recommended for complete assessment. Electronically Signed   By: Andrea Gasman M.D.   On: 05/30/2024 23:13   DG Chest Port 1  View Result Date: 05/30/2024 CLINICAL DATA:  Chest pain EXAM: PORTABLE CHEST 1 VIEW COMPARISON:  05/30/2024 FINDINGS: Stable small laterally loculated left pleural effusion. Lung volumes are small and pulmonary insufflation has diminished since prior examination. Resultant vascular crowding at the hila. No confluent pulmonary infiltrate. No pneumothorax. No pleural effusion on the right. Coronary artery bypass grafting has been performed. Cardiac size within normal limits. No acute bone abnormality IMPRESSION: 1. Pulmonary hypoinflation. 2. Stable small laterally loculated left pleural effusion. Electronically Signed   By: Dorethia Molt M.D.   On: 05/30/2024 23:11   CT Head Wo Contrast Result Date: 05/30/2024 CLINICAL DATA:  Head trauma EXAM: CT HEAD WITHOUT CONTRAST TECHNIQUE: Contiguous axial images were obtained from the base of the skull through the vertex without intravenous contrast. RADIATION DOSE REDUCTION: This exam was performed according to the departmental dose-optimization program which includes automated exposure control, adjustment of the mA and/or kV according to patient size and/or use of iterative reconstruction technique. COMPARISON:  MRI head 09/01/2021 and CT head 04/27/2006 FINDINGS: Brain: No intracranial hemorrhage, mass effect, or evidence of acute infarct. No hydrocephalus. No extra-axial fluid collection. Generalized cerebral atrophy and chronic small vessel ischemic disease. Vascular: No hyperdense vessel. Intracranial arterial calcification. Skull: No fracture or focal lesion. Sinuses/Orbits: No acute finding. Other: None. IMPRESSION: No acute intracranial abnormality. Electronically Signed   By: Norman Gatlin M.D.   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 Presents with chest pain. Fell yesterday but did not seek medical attention. EXAM: CHEST - 2 VIEW  COMPARISON:  Chest CT with contrast 06/20/2020 FINDINGS: The lungs are mildly emphysematous. Some volume loss again noted on the left with left lower lobectomy. There are post treatment changes in the dorsal left mid lung best seen on the lateral. There is increased opacity is suspected in the left base concerning for pneumonia or aspiration. The remaining lungs are clear. No substantial pleural effusion is seen. Stable mild cardiomegaly with CABG changes. Central vessel markings are normal caliber. Stable mediastinum with aortic atherosclerosis. Osteopenia and bridging enthesopathy thoracic spine with no new osseous findings. IMPRESSION: 1. Increased opacity in the left base concerning for pneumonia or aspiration. 2. Emphysema, left lower lobectomy and post treatment changes 3. Stable mild cardiomegaly with CABG changes. 4. Aortic atherosclerosis. 5. Osteopenia and bridging enthesopathy thoracic spine. Electronically Signed   By: Francis Quam M.D.   On: 05/30/2024 22:11      IMPRESSION/PLAN: Assessment  Rodney Christensen is  an 83 year old gentleman with a complex medical history including advanced Alzheimer's dementia, prior stage IIB lung cancer treated definitively in 2012, coronary artery disease s/p CABG, and multiple other comorbidities. 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 There is associated bulky left neck adenopathy. Systemic imaging suggests widely metastatic disease, including numerous bilateral pulmonary nodules, right adrenal metastasis, osseous involvement at L4, and destructive left sacral metastasis.  Given his advanced age, severe comorbidities, and functional decline with failure to thrive, it would be reasonable to consider a comfort-focused approach. However, I strongly advise against immediate hospice measures due  to the significant risk of catastrophic airway compromise from progressive laryngeal obstruction, as well as the potential for hemoptysis with tumor erosion. A brief course of palliative radiotherapy has a reasonable chance of achieving local control to preserve airway patency and reduce bleeding risk while we continue to clarify the full extent of disease and consider systemic options.  Should treatment prove effective, and if subsequent molecular or biomarker evaluation suggests potential sensitivity to immunotherapy or targeted therapy, there may be opportunities to extend systemic treatment.  An appropriate palliative radiotherapy regimen would be the "[employer]" (14 Gy in 4 fractions BID over 2 consecutive days), which has demonstrated good symptom palliation and local control in patients with advanced head and neck cancers and is generally well-tolerated.  Plan  Recommend initiation of Quad Shot radiotherapy to the primary laryngeal/hypopharyngeal mass for local palliation, airway preservation, and hemostatic benefit  Coordinate simulation and treatment planning to proceed today  Continue full staging work-up, including any feasible biomarker testing, to assess candidacy for systemic therapy  Discussed goals of care with patient and wife, emphasizing the risks and benefits of treatment versus comfort measures  The patient and his wife understand the options and are willing to proceed with palliative radiation  Provide supportive care for symptom management during radiotherapy  Multidisciplinary discussion encouraged if additional treatment options become appropriate  I personally spent 75 minutes in this encounter including chart review, reviewing radiological studies, meeting face-to-face with the patient, entering orders and completing documentation.    Sabra MICAEL Rusk, PA-C    Donnice Barge, MD  [facility]  Radiation Oncology Direct Dial: [phone]  Fax:  [phone] [email or web address]  Skype  LinkedIn     References for Du Pont in head and neck cancer palliation:  Paulett PARAS, et al. "Quad shot" palliative radiotherapy for incurable head and neck cancer. Radiother Oncol. 2005 Jun;75(3):318-22.  Lok BH, et al. Palliative radiation therapy for head and neck cancer. Int PARAS Shock Oncol Biol Phys. 2015 Nov;93(3):S72.  Al-Mamgani A, et al. Short split-course palliative radiotherapy [employer] Shot) in head and neck cancer. Radiother Oncol. 2013 Sep;108(3):542-546.

## 2024-06-01 NOTE — Progress Notes (Signed)
  Radiation Oncology         ([phone]) [phone] ________________________________  Name: PRIMITIVO MERKEY MRN: [id number]  Date: 06/01/2024  DOB: [birth year]  INPATIENT  SIMULATION AND TREATMENT PLANNING NOTE    ICD-10-CM   1. 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  Identity was confirmed.  All relevant records and images related to the planned course of therapy were reviewed.  The patient freely provided informed written consent to proceed with treatment after reviewing the details related to the planned course of therapy. The consent form was witnessed and verified by the simulation staff.  Then, the patient was set-up in a stable reproducible  supine position for radiation therapy.  CT images were obtained.  Surface markings were placed.  The CT images were loaded into the planning software.  Then the target and avoidance structures were contoured.  Treatment planning then occurred.  The radiation prescription was entered and confirmed.  Then, I designed and supervised the construction of multiple medically necessary complex treatment devices defined in the [facility] EMR note, including an aquaplast mask and accuform neck cushion for immobilization.  I have requested : Intensity Modulated Radiotherapy (IMRT) is medically necessary for this case for the following reason:  Parotid sparing.  PLAN:  The patient will receive 14.8 Gy in 4 BID fractions for 3.7 Gy to start tomorrow, 7/2 and complete radiation on 7/3.  This will be followed by a planned 3 week break, while his cancer is worked up and staged.  An additional radiation boost will be considered for more durable palliation if clinically  appropriate.  ________________________________  Donnice LABOR. Patrcia, M.D.

## 2024-06-01 NOTE — Progress Notes (Addendum)
 PROGRESS NOTE    Rodney Christensen  FMW:[id number] DOB: Apr 08, 1941 DOA: 05/30/2024 PCP: Chrystal Lamarr RAMAN, MD   Brief Narrative: 83 year old with past medical history significant for hypertension, BPH, dementia, non-small cell lung cancer status post chemoradiation and left lower lobe lobectomy 2012, CAD status post CABG presents with fatigue, generalized weakness, frequent fall, labored breathing and chest pain.  Symptoms started 3 weeks ago after he fell.  Patient fell again the night of admission and was having chest discomfort.  He was brought by EMS for further evaluation.  Evaluation in the ED: Patient was noted to be dehydrated sodium 146, creatinine 1.7 lactic acid 2.0 troponin 80, imaging most concerning for large Left glottic, laryngeal, and hypopharyngeal mass, bulky adenopathy, numerous bilateral pulmonary nodules, right adrenal gland nodule, lytic lesion involving the L4 vertebral body, and left lower lobe consolidation.    Assessment & Plan:   Principal Problem:   Cancer of intrinsic larynx (HCC) Active Problems:   Essential hypertension   CAD, NATIVE VESSEL   Mixed Alzheimer's and vascular dementia (HCC)   AKI (acute kidney injury) (HCC)   Pneumonia   1-Large left neck mass, bulky adenopathy, bilateral pulmonary nodules, adrenal nodules, and lytic L4 lesion noted on imaging. History of non-small cell lung cancer status post chemoradiation and left lower lobe lobectomy 2012,  - ENT, Dr Jesus consulted, he will discussed care with oncologist  -Dr Sherrod, his primary oncologist, consulted. He will see patient today or tomorrow morning.  -Plan to continue NPO for now, fail MBS, wife wants to discussed with Dr Sherrod.  Per Dr Patrcia, radiation oncology, patient needs to transfer to [facility] for radiation tx.   Pneumonia: -Ct chest : numerous pulmonary nodule, consolidation and bronchiolectatic changes.  -Presents with cough, productive. -Continue with IV Unasyn.   AKI:   -Presents with cr 1,8. Previous baseline 1.1 -Continue with IV fluids.  -Cr down to 1.2 Improved.    Hypernatremia:  -Continue with  IV fluid D 5  Hypertension: -PRN hydralazine.  -hold lisinopril, Demadex due to AKI  CAD:  -Hold lisinopril, metoprolol, statins while NPO  Dementia:  -Resume Namenda  when tolerate oral.   Hypokalemia; Replete IV      Estimated body mass index is 26.78 kg/m as calculated from the following:   Height as of 03/25/24: 5' 7 (1.702 m).   Weight as of 09/18/23: 77.6 kg.   DVT prophylaxis: Heparin  Code Status: Full code Family Communication: Wife at bedside  Disposition Plan:  Status is: Inpatient Remains inpatient appropriate because: management of PNA, AKI    Consultants:  ENT, Dr Jesus.  Dr Sherrod  Procedures:    Antimicrobials:    Subjective: He is alert, report cough. He is getting ready to go don to radiology for MBS.    Objective: Vitals:   05/31/24 1459 05/31/24 2025 06/01/24 0436 06/01/24 0748  BP: (!) 157/104 (!) 164/75 (!) 109/46 (!) 121/51  Pulse: 81 83 74 69  Resp:  19 17 17   Temp:  98.2 F (36.8 C) 98.1 F (36.7 C)   TempSrc:      SpO2: 100% 98% 96% 97%    Intake/Output Summary (Last 24 hours) at 06/01/2024 1145 Last data filed at 06/01/2024 1100 Gross per 24 hour  Intake --  Output 900 ml  Net -900 ml   There were no vitals filed for this visit.  Examination:  General exam: NAD Respiratory system: Normal resp effort Cardiovascular system: S 1, S 2 RRR Gastrointestinal system: BS  present, soft, nt Central nervous system: alert.   Data Reviewed: I have personally reviewed following labs and imaging studies  CBC: Recent Labs  Lab 05/30/24 2209 05/30/24 2220 05/31/24 0409 06/01/24 0336  WBC 9.9  --  11.9* 10.2  HGB 10.7* 11.2*  11.6* 11.0* 10.6*  HCT 35.3* 33.0*  34.0* 35.2* 32.9*  MCV 97.0  --  95.7 93.5  PLT 150  --  139* 149*   Basic Metabolic Panel: Recent Labs  Lab  05/30/24 2209 05/30/24 2220 05/31/24 0409 06/01/24 0336  NA 146* 147*  146* 146* 145  K 3.7 3.6  3.6 3.7 3.4*  CL 111 109 113* 109  CO2 24  --  21* 25  GLUCOSE 92 95 91 102*  BUN 39* 37* 36* 22  CREATININE 1.73* 1.80* 1.44* 1.25*  CALCIUM 9.0  --  8.9 8.9  MG  --   --  2.4  --    GFR: CrCl cannot be calculated (Unknown ideal weight.). Liver Function Tests: Recent Labs  Lab 05/30/24 2209  AST 27  ALT 15  ALKPHOS 79  BILITOT 0.5  PROT 7.1  ALBUMIN 3.2*   Recent Labs  Lab 05/30/24 2209  LIPASE 39   No results for input(s): AMMONIA in the last 168 hours. Coagulation Profile: Recent Labs  Lab 05/30/24 2313  INR 1.1   Cardiac Enzymes: No results for input(s): CKTOTAL, CKMB, CKMBINDEX, TROPONINI in the last 168 hours. BNP (last 3 results) No results for input(s): PROBNP in the last 8760 hours. HbA1C: No results for input(s): HGBA1C in the last 72 hours. CBG: No results for input(s): GLUCAP in the last 168 hours. Lipid Profile: No results for input(s): CHOL, HDL, LDLCALC, TRIG, CHOLHDL, LDLDIRECT in the last 72 hours. Thyroid  Function Tests: No results for input(s): TSH, T4TOTAL, FREET4, T3FREE, THYROIDAB in the last 72 hours. Anemia Panel: No results for input(s): VITAMINB12, FOLATE, FERRITIN, TIBC, IRON, RETICCTPCT in the last 72 hours. Sepsis Labs: Recent Labs  Lab 05/30/24 2220 05/31/24 0025 05/31/24 0409  PROCALCITON  --   --  0.10  LATICACIDVEN 2.0* 1.3  --     Recent Results (from the past 240 hours)  Culture, blood (routine x 2)     Status: None (Preliminary result)   Collection Time: 05/30/24 11:11 PM   Specimen: BLOOD  Result Value Ref Range Status   Specimen Description BLOOD RIGHT ARM  Final   Special Requests   Final    BOTTLES DRAWN AEROBIC ONLY Blood Culture results may not be optimal due to an inadequate volume of blood received in culture bottles   Culture   Final    NO GROWTH 1  DAY Performed at [facility], [address], [place], [place] [address]    Report Status PENDING  Incomplete  Culture, blood (routine x 2)     Status: None (Preliminary result)   Collection Time: 05/31/24 12:23 AM   Specimen: BLOOD  Result Value Ref Range Status   Specimen Description BLOOD BLOOD RIGHT HAND  Final   Special Requests   Final    BOTTLES DRAWN AEROBIC AND ANAEROBIC Blood Culture adequate volume   Culture   Final    NO GROWTH 1 DAY Performed at [facility], [address], [place], [place] [address]    Report Status PENDING  Incomplete         Radiology Studies: DG Swallowing Func-Speech Pathology Result Date: 06/01/2024 Table formatting from the original result was not included.  Modified Barium Swallow Study Patient Details Name: Rodney Christensen MRN: [id number] Date of Birth: August 15, 1941 Today's Date: 06/01/2024 HPI/PMH: HPI: 83 year old with past medical history significant for hypertension, BPH, dementia, non-small cell lung cancer status post chemoradiation and left lower lobe lobectomy 2012, CAD status post CABG presents with fatigue, generalized weakness, frequent fall, labored breathing and chest pain.  Symptoms started 3 weeks ago after he fell.  Patient fell again the night of admission and was having chest discomfort.  He was brought by EMS for further evaluation.  Found to have large Left glottic, laryngeal, and hypopharyngeal mass, bulky adenopathy, numerous bilateral pulmonary nodules, right adrenal gland nodule, lytic lesion involving the L4 vertebral body, and left lower lobe consolidation. Clinical Impression: Pt presents with a moderate-severe oropharyngeal dysphagia per results of MBSS completed today.  A safe oral diet cannot be recommended at this time. Oral deficits resulted in posterior spillage of liquids to the level of the pyriform sinuses or into the laryngeal vestibule before the swallow and inefficient oral transit with min-mod BOT residue  observed following the majority of trials. Pharyngeal deficits included reduced hyolaryngeal elevation/excursion, absent epiglottic inversion, reduced velum seal with partial nasal regurgitation of thin liquids, reduced laryngeal vestibule closure and reduced pharyngeal stripping. Findings: -There was aspiration with ejection and suspected x1 instance of aspiration without ejection of thin liquid residue after the swallow. Pt's shoulder position limited view of airway. -There was shallow and deep penetration with and without ejection of nectar-thick liquids during and after the swallow. Aspiration was not observed but delayed aspiration is expected. -There was shallow, transient penetration of honey-thick liquids and pudding during/after the swallow. Aspiration was not observed but delayed aspiration is expected. -Pt did appear to be sensate to majority or all of penetration/aspiration events with throat clear or cough response. -The amount of pharyngeal residue increased with increased viscosity resulting in large pharyngeal residue post pudding trial. Education: SLP followed up at bedside and discussed MBSS results at length with pt's wife. Pt was present though anticipate poor-no comprehension of this education. We discussed options of comfort diet vs conservative nutrition measures. Pt's wife stated plan to speak with oncologist about pt's prognosis and intervention option. We discussed plan to continue NPO except ice chips today with SLP follow up as goals of care are decided. Pt's wife verbalized good understanding of all education and all questions pertaining to swallowing were answered to her satisfaction today. Plan: SLP will follow up for support and education pending pt's goals of care.  Factors that may increase risk of adverse event in presence of aspiration Noe & Lianne 2021): Factors that may increase risk of adverse event in presence of aspiration Noe & Lianne 2021): Frail or deconditioned;  Dependence for feeding and/or oral hygiene; Reduced cognitive function Recommendations/Plan: Swallowing Evaluation Recommendations Swallowing Evaluation Recommendations Recommendations: NPO; Ice chips PRN after oral care Medication Administration: Via alternative means Supervision: Full assist for feeding Postural changes: Stay upright 30-60 min after meals; Position pt fully upright for meals Oral care recommendations: Oral care QID (4x/day) Recommended consults: Consider Palliative care Treatment Plan Treatment Plan Treatment recommendations: Therapy as outlined in treatment plan below Follow-up recommendations: -- (TBD pending goals of care) Functional status assessment: Patient has had a recent decline in their functional status and/or demonstrates limited ability to make significant improvements in function in a reasonable and predictable amount of time. Treatment frequency: Min 2x/week (pending goals of care) Treatment duration: 2 weeks (pending goals of care) Interventions: Patient/family education (pending  goals of care) Recommendations Recommendations for follow up therapy are one component of a multi-disciplinary discharge planning process, led by the attending physician.  Recommendations may be updated based on patient status, additional functional criteria and insurance authorization. Assessment: Orofacial Exam: Orofacial Exam Oral Cavity - Dentition: -- (unable to view) Orofacial Anatomy: WFL Oral Motor/Sensory Function: Unable to test Anatomy: Anatomy: -- (known laryngeal mass) Boluses Administered: Boluses Administered Boluses Administered: Thin liquids (Level 0); Mildly thick liquids (Level 2, nectar thick); Moderately thick liquids (Level 3, honey thick); Puree  Oral Impairment Domain: Oral Impairment Domain Lip Closure: No labial escape Tongue control during bolus hold: Posterior escape of less than half of bolus Bolus preparation/mastication: -- (not assessed) Bolus transport/lingual motion:  Delayed initiation of tongue motion (oral holding) Oral residue: Residue collection on oral structures Location of oral residue : Tongue Initiation of pharyngeal swallow : Pyriform sinuses  Pharyngeal Impairment Domain: Pharyngeal Impairment Domain Soft palate elevation: Escape to nasopharynx Laryngeal elevation: Minimal superior movement of thyroid  cartilage with minimal approximation of arytenoids to epiglottic petiole Anterior hyoid excursion: Partial anterior movement Epiglottic movement: No inversion Laryngeal vestibule closure: Incomplete, narrow column air/contrast in laryngeal vestibule Pharyngeal stripping wave : Present - diminished Pharyngeal contraction (A/P view only): N/A Pharyngoesophageal segment opening: Complete distension and complete duration, no obstruction of flow Tongue base retraction: Narrow column of contrast or air between tongue base and PPW Pharyngeal residue: Majority of contrast within or on pharyngeal structures Location of pharyngeal residue: Tongue base; Pharyngeal wall; Valleculae; Diffuse (>3 areas)  Esophageal Impairment Domain: Esophageal Impairment Domain Esophageal clearance upright position: -- (not assessed) Pill: Pill Consistency administered: -- (not assessed) Penetration/Aspiration Scale Score: Penetration/Aspiration Scale Score 2.  Material enters airway, remains ABOVE vocal cords then ejected out: Mildly thick liquids (Level 2, nectar thick); Puree; Moderately thick liquids (Level 3, honey thick) 3.  Material enters airway, remains ABOVE vocal cords and not ejected out: Thin liquids (Level 0); Mildly thick liquids (Level 2, nectar thick) 4.  Material enters airway, CONTACTS cords then ejected out: Thin liquids (Level 0); Mildly thick liquids (Level 2, nectar thick) 5.  Material enters airway, CONTACTS cords and not ejected out: Thin liquids (Level 0) 6.  Material enters airway, passes BELOW cords then ejected out: Thin liquids (Level 0) 7.  Material enters airway,  passes BELOW cords and not ejected out despite cough attempt by patient: Thin liquids (Level 0) Compensatory Strategies: Compensatory Strategies Compensatory strategies: Yes Straw: Ineffective Ineffective Straw: Thin liquid (Level 0); Mildly thick liquid (Level 2, nectar thick) Chin tuck: Ineffective Ineffective Chin Tuck: Thin liquid (Level 0) Liquid wash: Effective Effective Liquid Wash: Mildly thick liquid (Level 2, nectar thick) Left head turn: Ineffective Ineffective Left Head Turn: Mildly thick liquid (Level 2, nectar thick) Right head turn: Ineffective Ineffective Right Head Turn: Mildly thick liquid (Level 2, nectar thick)   General Information: Caregiver present: Yes  Diet Prior to this Study: NPO   Temperature : Normal   Respiratory Status: WFL   Supplemental O2: None (Room air)   History of Recent Intubation: No  Behavior/Cognition: Alert; Requires cueing; Confused No data recorded Baseline vocal quality/speech: -- (intermittent wet voicing noted) Volitional Cough: -- (inconsistent) Volitional Swallow: -- (inconsistent) Exam Limitations: Limited visibility (due to shoulder position) Goal Planning: Prognosis for improved oropharyngeal function: Guarded Barriers to Reach Goals: Cognitive deficits; Severity of deficits; Overall medical prognosis No data recorded Patient/Family Stated Goal: none stated Consulted and agree with results and recommendations: Patient; Nurse; Physician; Family member/caregiver Pain:  No data recorded End of Session: Start Time:SLP Start Time (ACUTE ONLY): 0930 Stop Time: SLP Stop Time (ACUTE ONLY): 1000 Start Time:SLP Start Time (ACUTE ONLY): 1009 Stop Time: SLP Stop Time (ACUTE ONLY): 1038 Time Calculation:SLP Time Calculation (min) (ACUTE ONLY): 30 min Time Calculation:SLP Time Calculation (min) (ACUTE ONLY): 29 min Charges: SLP Evaluations $ SLP Speech Visit: 1 Visit SLP Evaluations $MBS Swallow: 1 Procedure $Swallowing Treatment: 1 Procedure SLP visit diagnosis: SLP Visit  Diagnosis: Dysphagia, oropharyngeal phase (R13.12) Past Medical History: Past Medical History: Diagnosis Date  BPH (benign prostatic hyperplasia)   CAD, NATIVE VESSEL 03/22/2010  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  Diverticulosis   History of radiation therapy 03/18/11 to 04/19/11  lower left lung  HYPERLIPIDEMIA-MIXED 03/17/2009  HYPERTENSION, UNSPECIFIED 03/17/2009  Macular degeneration   Skin cancer   melanoma-nose  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 EXAM: CT NECK WITH CONTRAST TECHNIQUE: Multidetector CT imaging of the neck was performed using the standard protocol following the bolus administration of intravenous contrast. RADIATION DOSE REDUCTION: This exam was performed according to the departmental dose-optimization program which includes automated exposure control, adjustment of the mA and/or kV according to patient size and/or use of iterative reconstruction technique. CONTRAST:  75mL OMNIPAQUE  IOHEXOL  350 MG/ML SOLN COMPARISON:  None Available. FINDINGS: Pharynx and larynx: Bulky/large 4.1 x 3.5 x 3.7 cm glottic laryngeal and hypopharyngeal mass with both supraglottic and subglottic extension clips. The mass abuts the arytenoid cartilage and extends anteriorly to involve the left true cord with extension through and erosion of the he invasion of the paraglottic fat and invasion through the thyroid  cartilage. Salivary glands: Absent left submandibular gland. Right submandibular gland and parotid glands are within normal limits. Clips in the left submandibular region and left neck.  Thyroid : Subcentimeter thyroid  nodules which do not require further imaging follow-up (ref: J Am Coll Radiol. 2015 Feb;12(2): 143-50). Lymph nodes: Enlarged, bulky 2.9 cm left level II Clips in the left neck. Multiple additional surrounding enlarged left level II and rounded level III lymph nodes. Vascular: Severe stenosis of the left internal carotid artery proximally, not well characterized on this study. Extensive atherosclerosis. Limited intracranial: Negative. Visualized orbits: Negative. Mastoids and visualized paranasal sinuses: Multiple retention cysts. No mastoid effusions. Skeleton: Further evaluated on same day CT of the cervical spine. Upper chest: Evaluated on same day CT of the chest/abdomen/pelvis. IMPRESSION: 1. Large 4.1 cm left glottic laryngeal and hypopharyngeal mass with both supraglottic and subglottic extension, involvement of the left true cord and paraglottic fat, and invasion through the left thyroid  cartilage. Recommend ENT consultation. 2. Bulky enlarged 2.9 cm left level II, compatible with nodal metastasis. Multiple additional surrounding enlarged left level II and rounded level III lymph nodes, suspicious for additional metastases. A PET-CT could provide more definitive nodal staging if clinically warranted. 3. Severe stenosis of the left internal carotid artery proximally, not well characterized on this study. A CTA of the neck could further evaluate/quantify. Preliminary findings discussed with Dr. Haze via telephone at 1:22 a.m. Electronically Signed   By: Gilmore GORMAN Molt M.D.   On:  05/31/2024 01:26   CT CHEST ABDOMEN PELVIS W CONTRAST Result Date: 05/31/2024 CLINICAL DATA:  Sepsis. Suspicious pulmonary nodule seen on CT cervical spine earlier today EXAM: CT CHEST, ABDOMEN, AND PELVIS WITH CONTRAST TECHNIQUE: Multidetector CT imaging of the chest, abdomen and pelvis was performed following the standard protocol during bolus administration of intravenous contrast. RADIATION  DOSE REDUCTION: This exam was performed according to the departmental dose-optimization program which includes automated exposure control, adjustment of the mA and/or kV according to patient size and/or use of iterative reconstruction technique. CONTRAST:  75mL OMNIPAQUE  IOHEXOL  350 MG/ML SOLN COMPARISON:  CT cervical spine earlier today; CT chest 06/20/2020; PET/CT 02/18/2011 FINDINGS: CT CHEST FINDINGS Cardiovascular: Coronary artery and aortic atherosclerotic calcification. No pericardial effusion. Normal caliber thoracic aorta. CABG. Mediastinum/Nodes: Wispy secretions in the trachea. Small hiatal hernia. No thoracic adenopathy. Left-sided soft tissue nodule posterior to the hyoid bone. See dedicated CT neck for details. Lungs/Pleura: Numerous bilateral pulmonary nodules suspicious for metastatic disease. The largest on the right is in the right lower lobe and measures 2.2 x 2.3 cm (series 5/image 104) the largest in the left is in the left upper lobe and measures 2.1 x 1.3 cm (5/74). Unchanged fullness in the left infrahilar region due to postradiation changes. Consolidation and bronchiectatic changes in the treated left lung following left lower lobectomy. Small left-sided pleural effusion. Small left pleural effusion. No pneumothorax. Musculoskeletal: No acute fracture or destructive osseous lesion. Sternotomy. CT ABDOMEN PELVIS FINDINGS Hepatobiliary: Cholelithiasis. No evidence of acute cholecystitis. No biliary dilation. No focal hepatic lesion. Pancreas: Unremarkable. Spleen: Unremarkable. Adrenals/Urinary Tract: New 1.7 x 1.3 cm nodule in the right adrenal gland concerning for metastasis. Unremarkable left adrenal gland. Cortical renal scarring both kidneys. No urinary calculi or hydronephrosis. Unremarkable bladder. Stomach/Bowel: Normal caliber large and small bowel. No bowel wall thickening. Small hiatal hernia. Stomach is otherwise within normal limits. Vascular/Lymphatic: Advanced aortic  atherosclerotic calcification. 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 No lymphadenopathy. Reproductive: Enlarged prostate indenting the floor of the bladder. Other: No free intraperitoneal fluid or air. Musculoskeletal: No acute fracture. Lytic lesion with surrounding sclerosis and cortical breakthrough and extraosseous soft tissue component in the L4 vertebral body consistent with metastasis. This measures 2.2 x 2.0 cm. IMPRESSION: 1. Numerous bilateral pulmonary nodules consistent with metastatic disease. 2. New 1.7 cm nodule in the right adrenal gland concerning for metastasis. 3. Lytic lesion with surrounding sclerosis and cortical breakthrough and extraosseous soft tissue component in the L4 vertebral body consistent with metastasis. 4. Chronic post surgical and radiation changes to the left lower lung. Small left pleural effusion. 5. Soft tissue nodule in the left neck at the level of the hyoid bone suspicious for primary malignancy or metastasis. See separate report of CT neck for details. 6. Cholelithiasis. 7. 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 8. Aortic Atherosclerosis (ICD10-I70.0). Electronically Signed   By: Norman Gatlin M.D.   On: 05/31/2024 00:29   CT Cervical Spine Wo Contrast Result Date: 05/30/2024 CLINICAL DATA:  Neck trauma (Age >= 65y) EXAM: CT CERVICAL SPINE WITHOUT CONTRAST TECHNIQUE: Multidetector CT imaging of the cervical spine was performed without intravenous contrast. Multiplanar CT image reconstructions were also generated. RADIATION DOSE REDUCTION: This exam was performed according to the departmental dose-optimization program which includes automated exposure control, adjustment of the mA and/or kV according to patient size and/or use of iterative reconstruction technique. COMPARISON:  None Available. FINDINGS: Alignment: Normal. Skull base and vertebrae: No acute fracture. Vertebral body heights are maintained. The  dens and skull base are intact. Soft tissues and  spinal canal: No prevertebral fluid or swelling. No visible canal hematoma. Disc levels: Multilevel degenerative change throughout the cervical spine. Disc space narrowing and spurring at multiple levels with facet hypertrophy. Upper chest: Postsurgical change at the carotid bulb with asymmetric rounded soft tissue density, series 5, image 54. Abnormal soft tissue density in the left neck just posterior to the hyoid bone causing mass effect on the airway, series 5, image 78. There are multiple apical pulmonary nodules, for example in the left upper lobe measuring 6 mm series 4, image 99. Right upper lobe only partially included in the field of view 8 mm series 4, image 102. Nodules are new from 2021 chest CT. Other: Right carotid calcifications. IMPRESSION: 1. No acute fracture or subluxation of the cervical spine. 2. Multilevel degenerative change. 3. Postsurgical change at the carotid bulb with asymmetric rounded soft tissue density. Abnormal soft tissue density in the left neck just posterior to the hyoid bone causing mass effect on the airway. Findings may represent adenopathy. Recommend contrast-enhanced neck CT for further assessment. 4. Multiple apical pulmonary nodules, new from 2021 chest CT, suspicious for metastatic disease. Chest CT recommended for complete assessment. Electronically Signed   By: Andrea Gasman M.D.   On: 05/30/2024 23:13   DG Chest Port 1 View Result Date: 05/30/2024 CLINICAL DATA:  Chest pain EXAM: PORTABLE CHEST 1 VIEW COMPARISON:  05/30/2024 FINDINGS: Stable small laterally loculated left pleural effusion. Lung volumes are small and pulmonary insufflation has diminished since prior examination. Resultant vascular crowding at the hila. No confluent pulmonary infiltrate. No pneumothorax. No pleural effusion on the right. Coronary artery bypass grafting has been performed. Cardiac size within normal limits. No acute bone abnormality IMPRESSION: 1. Pulmonary hypoinflation. 2. Stable  small laterally loculated left pleural effusion. Electronically Signed   By: Dorethia Molt M.D.   On: 05/30/2024 23:11   CT Head Wo Contrast Result Date: 05/30/2024 CLINICAL DATA:  Head trauma EXAM: CT HEAD WITHOUT CONTRAST TECHNIQUE: Contiguous axial images were obtained from the base of the skull through the vertex without intravenous contrast. RADIATION DOSE REDUCTION: This exam was performed according to the departmental dose-optimization program which includes automated exposure control, adjustment of the mA and/or kV according to patient size and/or use of iterative reconstruction technique. COMPARISON:  MRI head 09/01/2021 and CT head 04/27/2006 FINDINGS: Brain: No intracranial hemorrhage, mass effect, or evidence of acute infarct. No hydrocephalus. No extra-axial fluid collection. Generalized cerebral atrophy and chronic small vessel ischemic disease. Vascular: No hyperdense vessel. Intracranial arterial calcification. Skull: No fracture or focal lesion. Sinuses/Orbits: No acute finding. Other: None. IMPRESSION: No acute intracranial abnormality. Electronically Signed   By: Norman Gatlin M.D.   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 Presents with chest pain. Fell yesterday but did not seek medical attention. EXAM: CHEST - 2 VIEW COMPARISON:  Chest CT with contrast 06/20/2020 FINDINGS: The lungs are mildly emphysematous. Some volume loss again noted on the left with left lower lobectomy. There are post treatment changes in the dorsal left mid lung best seen on the lateral. There is increased opacity is suspected in the left base concerning for pneumonia or aspiration. The remaining lungs are clear. No substantial pleural effusion is seen. Stable mild cardiomegaly with CABG changes. Central vessel markings are normal caliber. Stable mediastinum with aortic atherosclerosis. Osteopenia  and  bridging enthesopathy thoracic spine with no new osseous findings. IMPRESSION: 1. Increased opacity in the left base concerning for pneumonia or aspiration. 2. Emphysema, left lower lobectomy and post treatment changes 3. Stable mild cardiomegaly with CABG changes. 4. Aortic atherosclerosis. 5. Osteopenia and bridging enthesopathy thoracic spine. Electronically Signed   By: Francis Quam M.D.   On: 05/30/2024 22:11        Scheduled Meds:  heparin  5,000 Units Subcutaneous Q8H   sodium chloride  flush  3 mL Intravenous Q12H   Continuous Infusions:  ampicillin-sulbactam (UNASYN) IV 3 g (06/01/24 0551)   dextrose     potassium chloride       LOS: 1 day    Time spent: 35 minutes    Adahlia Stembridge A Gean Larose, MD [facility]   If 7PM-7AM, please contact night-coverage [email or web address]  06/01/2024, 11:45 AM

## 2024-06-01 NOTE — Consult Note (Cosign Needed)
 [facility] CONSULT NOTE  Patient Care Team: Chrystal Lamarr RAMAN, MD as PCP - General (Family Medicine)  CHIEF COMPLAINTS/PURPOSE OF CONSULTATION:  History of NSCLC  REFERRING PHYSICIAN: Dr. Madelyne  HISTORY OF PRESENTING ILLNESS:  Rodney Christensen 83 y.o. male who was admitted on 05/31/2024 with complaints of fatigue, generalized weakness, falls, chest pain, and labored breathing. Work-up done and showed imaging concerning for hypopharyngeal mass, numerous bilateral pulmonary nodules and bulky adenopathy.  Due to history of non-small cell lung cancer, Oncology consult has been requested. Patient seen resting comfortably in bed.  He is unable to answer questions due to dementia per Wife who serves as principal historian. She states that he forgets everything within 5 minutes.   Medical history as stated is significant for non-small cell lung cancer.  Also has hypertension, hyperlipidemia and dementia.  Surgical history significant for thoracotomy with lobectomy in 2012.  Social history lives at home with his wife who states it is becoming increasingly difficult to care for him. She states he used to smoke for 25 years, drank alcohol occasionally in the past, and she denies patient's drug usage.      I have reviewed his chart and materials related to his cancer extensively and collaborated history with the patient. Summary of oncologic history is as follows: Oncology History   No history exists.    ASSESSMENT & PLAN:  Large left neck mass, newly diagnosed History of Non-Small Cell Lung Cancer with newly diagnosed metastatic disease -- Diagnosed 2011.   -- Status post thoracotomy and lower left lobectomy in 2012.  -- Status post radiation therapy in 2012.  -- Status post chemotherapy with weekly carboplatin and paclitaxel, last dose given in 2012. -- Patient's wife states that he subsequently had yearly follow-ups and came to last outpatient oncology office 06/2020.   --  CT scan neck 6/29 shows large 4.1 cm left glottic laryngeal and hypopharyngeal mass.  -- Imaging 6/29 shows numerous bil pulmonary nodules, right adrenal gland nodule, and lytic lesions, all concerning for metastatic disease -- Seen by Rad Onc. Consideration for radiation therapy. May need transfer to [facility].  -- Recommend Palliative eval for goals of care discussion -- Medical Oncology/Dr. Sherrod will make further evaluation and treatment recommendations.  Pneumonia -- per CT chest -- Continue antibiotics as ordered -- Medicine managing  Anemia -- Hemoglobin 10.6 -- Likely multifactorial secondary to malignancy, renal dysfunction and co-morbidities -- Continue to monitor CBC with differential  AKI -- Creatinine improving slightly -- Avoid nephrotoxic agents -- Continue to monitor renal function  Dementia Hypertension Hyperlipidemia  --Continue supportive care  MEDICAL HISTORY:  Past Medical History:  Diagnosis Date   BPH (benign prostatic hyperplasia)    CAD, NATIVE VESSEL 03/22/2010   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   Diverticulosis    History of radiation therapy 03/18/11 to 04/19/11   lower left lung   HYPERLIPIDEMIA-MIXED 03/17/2009   HYPERTENSION, UNSPECIFIED 03/17/2009   Macular degeneration    Skin cancer    melanoma-nose   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     SURGICAL HISTORY: Past Surgical History:  Procedure Laterality Date   BRONCHOSCOPY  06/11/11   [facility]rickson   CAROTID ENDARTERECTOMY     CORONARY ARTERY BYPASS GRAFT  10/10/2004   x5   Lt VATS,Lt thoacotomy,Lt lower lobectomy with  mediastinal node  dissection  06/11/11   Herndrickson    SOCIAL HISTORY: Social History   Socioeconomic History   Marital status:  Married    Spouse name: Not on file   Number of children: 2   Years of education: 33   Highest education level: Not on file  Occupational History   Not on file  Tobacco Use   Smoking status: Former    Current packs/day:  0.00    Average packs/day: 2.5 packs/day for 25.0 years (62.5 ttl pk-yrs)    Types: Cigarettes    Start date: 09/14/1956    Quit date: 09/14/1981    Years since quitting: 42.7   Smokeless tobacco: Never  Vaping Use   Vaping status: Never Used  Substance and Sexual Activity   Alcohol use: Yes    Comment: occassional   Drug use: No   Sexual activity: Not on file  Other Topics Concern   Not on file  Social History Narrative   Right handed   Drinks caffeine   One story home   Retired   Lives with wife   Social Drivers of Corporate investment banker Strain: Not on file  Food Insecurity: No Food Insecurity (05/31/2024)   Hunger Vital Sign    Worried About Running Out of Food in the Last Year: Never true    Ran Out of Food in the Last Year: Never true  Transportation Needs: No Transportation Needs (05/31/2024)   PRAPARE - Administrator, Civil Service (Medical): No    Lack of Transportation (Non-Medical): No  Physical Activity: Not on file  Stress: Not on file  Social Connections: Socially Isolated (05/31/2024)   Social Connection and Isolation Panel    Frequency of Communication with Friends and Family: Never    Frequency of Social Gatherings with Friends and Family: Never    Attends Religious Services: Never    Database administrator or Organizations: No    Attends Banker Meetings: Never    Marital Status: Married  Catering manager Violence: Not At Risk (05/31/2024)   Humiliation, Afraid, Rape, and Kick questionnaire    Fear of Current or Ex-Partner: No    Emotionally Abused: No    Physically Abused: No    Sexually Abused: No    FAMILY HISTORY: Family History  Problem Relation Age of Onset   Stroke Mother      PHYSICAL EXAMINATION: ECOG PERFORMANCE STATUS: 3 - Symptomatic, >50% confined to bed  Vitals:   06/01/24 0436 06/01/24 0748  BP: (!) 109/46 (!) 121/51  Pulse: 74 69  Resp: 17 17  Temp: 98.1 F (36.7 C)   SpO2: 96% 97%    There were no vitals filed for this visit.  GENERAL: alert, +chronically ill-appearing SKIN: +pale skin color, texture, turgor are normal, no rashes or significant lesions EYES: normal, conjunctiva are pink and non-injected, sclera clear OROPHARYNX: no exudate, no erythema and lips, buccal mucosa, and tongue normal  NECK: +left sided neck massy LYMPH: + palpable lymphadenopathy in the cervical  LUNGS: clear to auscultation and percussion with normal breathing effort HEART: regular rate & rhythm and no murmurs and no lower extremity edema ABDOMEN: abdomen soft, non-tender and normal bowel sounds MUSCULOSKELETAL: no cyanosis of digits and no clubbing  PSYCH: alert +dementia NEURO: no focal motor/sensory deficits   ALLERGIES:  is allergic to cephalexin and latanoprost.  MEDICATIONS:  Current Facility-Administered Medications  Medication Dose Route Frequency Provider Last Rate Last Admin   acetaminophen (TYLENOL) tablet 650 mg  650 mg Oral Q6H PRN Opyd, Timothy S, MD       Or   acetaminophen (  TYLENOL) suppository 650 mg  650 mg Rectal Q6H PRN Opyd, Evalene RAMAN, MD       Ampicillin-Sulbactam (UNASYN) 3 g in sodium chloride  0.9 % 100 mL IVPB  3 g Intravenous Q8H Opyd, Evalene RAMAN, MD 200 mL/hr at 06/01/24 0551 3 g at 06/01/24 0551   dextrose 5 % solution   Intravenous Continuous Regalado, Belkys A, MD       fentaNYL (SUBLIMAZE) injection 12.5 mcg  12.5 mcg Intravenous Q2H PRN Regalado, Belkys A, MD       heparin injection 5,000 Units  5,000 Units Subcutaneous Q8H Opyd, Timothy S, MD   5,000 Units at 06/01/24 0550   hydrALAZINE (APRESOLINE) injection 5 mg  5 mg Intravenous Q6H PRN Regalado, Belkys A, MD   5 mg at 05/31/24 0936   ondansetron (ZOFRAN) tablet 4 mg  4 mg Oral Q6H PRN Opyd, Evalene RAMAN, MD       Or   ondansetron (ZOFRAN) injection 4 mg  4 mg Intravenous Q6H PRN Opyd, Timothy S, MD       potassium chloride 10 mEq in 100 mL IVPB  10 mEq Intravenous Q1 Hr x 3 Regalado, Belkys A, MD        sodium chloride  flush (NS) 0.9 % injection 3 mL  3 mL Intravenous Q12H Opyd, Evalene RAMAN, MD   3 mL at 05/31/24 2008     LABORATORY DATA:  I have reviewed the data as listed Lab Results  Component Value Date   WBC 10.2 06/01/2024   HGB 10.6 (L) 06/01/2024   HCT 32.9 (L) 06/01/2024   MCV 93.5 06/01/2024   PLT 149 (L) 06/01/2024   Recent Labs    05/30/24 2209 05/30/24 2220 05/31/24 0409 06/01/24 0336  NA 146* 147*  146* 146* 145  K 3.7 3.6  3.6 3.7 3.4*  CL 111 109 113* 109  CO2 24  --  21* 25  GLUCOSE 92 95 91 102*  BUN 39* 37* 36* 22  CREATININE 1.73* 1.80* 1.44* 1.25*  CALCIUM 9.0  --  8.9 8.9  GFRNONAA 38*  --  48* 56*  PROT 7.1  --   --   --   ALBUMIN 3.2*  --   --   --   AST 27  --   --   --   ALT 15  --   --   --   ALKPHOS 79  --   --   --   BILITOT 0.5  --   --   --     RADIOGRAPHIC STUDIES: I have personally reviewed the radiological images as listed and agreed with the findings in the report. DG Swallowing Func-Speech Pathology Result Date: 06/01/2024 Table formatting from the original result was not included. Modified Barium Swallow Study Patient Details Name: Rodney Christensen MRN: [id number] Date of Birth: [birth year] Today's Date: 06/01/2024 HPI/PMH: HPI: 83 year old with past medical history significant for hypertension, BPH, dementia, non-small cell lung cancer status post chemoradiation and left lower lobe lobectomy 2012, CAD status post CABG presents with fatigue, generalized weakness, frequent fall, labored breathing and chest pain.  Symptoms started 3 weeks ago after he fell.  Patient fell again the night of admission and was having chest discomfort.  He was brought by EMS for further evaluation.  Found to have large Left glottic, laryngeal, and hypopharyngeal mass, bulky adenopathy, numerous bilateral pulmonary nodules, right adrenal gland nodule, lytic lesion involving the L4 vertebral body, and left lower lobe consolidation. Clinical Impression: Pt presents  with  a moderate-severe oropharyngeal dysphagia per results of MBSS completed today.  A safe oral diet cannot be recommended at this time. Oral deficits resulted in posterior spillage of liquids to the level of the pyriform sinuses or into the laryngeal vestibule before the swallow and inefficient oral transit with min-mod BOT residue observed following the majority of trials. Pharyngeal deficits included reduced hyolaryngeal elevation/excursion, absent epiglottic inversion, reduced velum seal with partial nasal regurgitation of thin liquids, reduced laryngeal vestibule closure and reduced pharyngeal stripping. Findings: -There was aspiration with ejection and suspected x1 instance of aspiration without ejection of thin liquid residue after the swallow. Pt's shoulder position limited view of airway. -There was shallow and deep penetration with and without ejection of nectar-thick liquids during and after the swallow. Aspiration was not observed but delayed aspiration is expected. -There was shallow, transient penetration of honey-thick liquids and pudding during/after the swallow. Aspiration was not observed but delayed aspiration is expected. -Pt did appear to be sensate to majority or all of penetration/aspiration events with throat clear or cough response. -The amount of pharyngeal residue increased with increased viscosity resulting in large pharyngeal residue post pudding trial. Education: SLP followed up at bedside and discussed MBSS results at length with pt's wife. Pt was present though anticipate poor-no comprehension of this education. We discussed options of comfort diet vs conservative nutrition measures. Pt's wife stated plan to speak with oncologist about pt's prognosis and intervention option. We discussed plan to continue NPO except ice chips today with SLP follow up as goals of care are decided. Pt's wife verbalized good understanding of all education and all questions pertaining to swallowing were  answered to her satisfaction today. Plan: SLP will follow up for support and education pending pt's goals of care.  Factors that may increase risk of adverse event in presence of aspiration Noe & Lianne 2021): Factors that may increase risk of adverse event in presence of aspiration Noe & Lianne 2021): Frail or deconditioned; Dependence for feeding and/or oral hygiene; Reduced cognitive function Recommendations/Plan: Swallowing Evaluation Recommendations Swallowing Evaluation Recommendations Recommendations: NPO; Ice chips PRN after oral care Medication Administration: Via alternative means Supervision: Full assist for feeding Postural changes: Stay upright 30-60 min after meals; Position pt fully upright for meals Oral care recommendations: Oral care QID (4x/day) Recommended consults: Consider Palliative care Treatment Plan Treatment Plan Treatment recommendations: Therapy as outlined in treatment plan below Follow-up recommendations: -- (TBD pending goals of care) Functional status assessment: Patient has had a recent decline in their functional status and/or demonstrates limited ability to make significant improvements in function in a reasonable and predictable amount of time. Treatment frequency: Min 2x/week (pending goals of care) Treatment duration: 2 weeks (pending goals of care) Interventions: Patient/family education (pending goals of care) Recommendations Recommendations for follow up therapy are one component of a multi-disciplinary discharge planning process, led by the attending physician.  Recommendations may be updated based on patient status, additional functional criteria and insurance authorization. Assessment: Orofacial Exam: Orofacial Exam Oral Cavity - Dentition: -- (unable to view) Orofacial Anatomy: WFL Oral Motor/Sensory Function: Unable to test Anatomy: Anatomy: -- (known laryngeal mass) Boluses Administered: Boluses Administered Boluses Administered: Thin liquids (Level 0);  Mildly thick liquids (Level 2, nectar thick); Moderately thick liquids (Level 3, honey thick); Puree  Oral Impairment Domain: Oral Impairment Domain Lip Closure: No labial escape Tongue control during bolus hold: Posterior escape of less than half of bolus Bolus preparation/mastication: -- (not assessed) Bolus transport/lingual motion: Delayed initiation of  tongue motion (oral holding) Oral residue: Residue collection on oral structures Location of oral residue : Tongue Initiation of pharyngeal swallow : Pyriform sinuses  Pharyngeal Impairment Domain: Pharyngeal Impairment Domain Soft palate elevation: Escape to nasopharynx Laryngeal elevation: Minimal superior movement of thyroid  cartilage with minimal approximation of arytenoids to epiglottic petiole Anterior hyoid excursion: Partial anterior movement Epiglottic movement: No inversion Laryngeal vestibule closure: Incomplete, narrow column air/contrast in laryngeal vestibule Pharyngeal stripping wave : Present - diminished Pharyngeal contraction (A/P view only): N/A Pharyngoesophageal segment opening: Complete distension and complete duration, no obstruction of flow Tongue base retraction: Narrow column of contrast or air between tongue base and PPW Pharyngeal residue: Majority of contrast within or on pharyngeal structures Location of pharyngeal residue: Tongue base; Pharyngeal wall; Valleculae; Diffuse (>3 areas)  Esophageal Impairment Domain: Esophageal Impairment Domain Esophageal clearance upright position: -- (not assessed) Pill: Pill Consistency administered: -- (not assessed) Penetration/Aspiration Scale Score: Penetration/Aspiration Scale Score 2.  Material enters airway, remains ABOVE vocal cords then ejected out: Mildly thick liquids (Level 2, nectar thick); Puree; Moderately thick liquids (Level 3, honey thick) 3.  Material enters airway, remains ABOVE vocal cords and not ejected out: Thin liquids (Level 0); Mildly thick liquids (Level 2, nectar thick)  4.  Material enters airway, CONTACTS cords then ejected out: Thin liquids (Level 0); Mildly thick liquids (Level 2, nectar thick) 5.  Material enters airway, CONTACTS cords and not ejected out: Thin liquids (Level 0) 6.  Material enters airway, passes BELOW cords then ejected out: Thin liquids (Level 0) 7.  Material enters airway, passes BELOW cords and not ejected out despite cough attempt by patient: Thin liquids (Level 0) Compensatory Strategies: Compensatory Strategies Compensatory strategies: Yes Straw: Ineffective Ineffective Straw: Thin liquid (Level 0); Mildly thick liquid (Level 2, nectar thick) Chin tuck: Ineffective Ineffective Chin Tuck: Thin liquid (Level 0) Liquid wash: Effective Effective Liquid Wash: Mildly thick liquid (Level 2, nectar thick) Left head turn: Ineffective Ineffective Left Head Turn: Mildly thick liquid (Level 2, nectar thick) Right head turn: Ineffective Ineffective Right Head Turn: Mildly thick liquid (Level 2, nectar thick)   General Information: Caregiver present: Yes  Diet Prior to this Study: NPO   Temperature : Normal   Respiratory Status: WFL   Supplemental O2: None (Room air)   History of Recent Intubation: No  Behavior/Cognition: Alert; Requires cueing; Confused No data recorded Baseline vocal quality/speech: -- (intermittent wet voicing noted) Volitional Cough: -- (inconsistent) Volitional Swallow: -- (inconsistent) Exam Limitations: Limited visibility (due to shoulder position) Goal Planning: Prognosis for improved oropharyngeal function: Guarded Barriers to Reach Goals: Cognitive deficits; Severity of deficits; Overall medical prognosis No data recorded Patient/Family Stated Goal: none stated Consulted and agree with results and recommendations: Patient; Nurse; Physician; Family member/caregiver Pain: No data recorded End of Session: Start Time:SLP Start Time (ACUTE ONLY): 0930 Stop Time: SLP Stop Time (ACUTE ONLY): 1000 Start Time:SLP Start Time (ACUTE ONLY): 1009 Stop  Time: SLP Stop Time (ACUTE ONLY): 1038 Time Calculation:SLP Time Calculation (min) (ACUTE ONLY): 30 min Time Calculation:SLP Time Calculation (min) (ACUTE ONLY): 29 min Charges: SLP Evaluations $ SLP Speech Visit: 1 Visit SLP Evaluations $MBS Swallow: 1 Procedure $Swallowing Treatment: 1 Procedure SLP visit diagnosis: SLP Visit Diagnosis: Dysphagia, oropharyngeal phase (R13.12) Past Medical History: Past Medical History: Diagnosis Date  BPH (benign prostatic hyperplasia)   CAD, NATIVE VESSEL 03/22/2010  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  Diverticulosis   History of radiation therapy 03/18/11 to 04/19/11  lower left lung  HYPERLIPIDEMIA-MIXED 03/17/2009  HYPERTENSION,  UNSPECIFIED 03/17/2009  Macular degeneration   Skin cancer   melanoma-nose  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 EXAM: CT NECK WITH CONTRAST TECHNIQUE: Multidetector CT imaging of the neck was performed using the standard protocol following the bolus administration of intravenous contrast. RADIATION DOSE REDUCTION: This exam was performed according to the departmental dose-optimization program which includes automated exposure control, adjustment of the mA and/or kV according to patient size and/or use of iterative reconstruction technique. CONTRAST:  75mL OMNIPAQUE  IOHEXOL  350 MG/ML SOLN COMPARISON:  None Available. FINDINGS: Pharynx and larynx: Bulky/large 4.1 x 3.5 x 3.7 cm glottic laryngeal and hypopharyngeal mass with both supraglottic and subglottic extension clips. The mass abuts the arytenoid cartilage and extends anteriorly  to involve the left true cord with extension through and erosion of the he invasion of the paraglottic fat and invasion through the thyroid  cartilage. Salivary glands: Absent left submandibular gland. Right submandibular gland and parotid glands are within normal limits. Clips in the left submandibular region and left neck. Thyroid : Subcentimeter thyroid  nodules which do not require further imaging follow-up (ref: J Am Coll Radiol. 2015 Feb;12(2): 143-50). Lymph nodes: Enlarged, bulky 2.9 cm left level II Clips in the left neck. Multiple additional surrounding enlarged left level II and rounded level III lymph nodes. Vascular: Severe stenosis of the left internal carotid artery proximally, not well characterized on this study. Extensive atherosclerosis. Limited intracranial: Negative. Visualized orbits: Negative. Mastoids and visualized paranasal sinuses: Multiple retention cysts. No mastoid effusions. Skeleton: Further evaluated on same day CT of the cervical spine. Upper chest: Evaluated on same day CT of the chest/abdomen/pelvis. IMPRESSION: 1. Large 4.1 cm left glottic laryngeal and hypopharyngeal mass with both supraglottic and subglottic extension, involvement of the left true cord and paraglottic fat, and invasion through the left thyroid  cartilage. Recommend ENT consultation. 2. Bulky enlarged 2.9 cm left level II, compatible with nodal metastasis. Multiple additional surrounding enlarged left level II and rounded level III lymph nodes, suspicious for additional metastases. A PET-CT could provide more definitive nodal staging if clinically warranted. 3. Severe stenosis of the left internal carotid artery proximally, not well characterized on this study. A CTA of the neck could further evaluate/quantify. Preliminary findings discussed with Dr. Haze via telephone at 1:22 a.m. Electronically Signed   By: Gilmore GORMAN Molt M.D.   On: 05/31/2024 01:26   CT CHEST ABDOMEN PELVIS W CONTRAST Result Date:  05/31/2024 CLINICAL DATA:  Sepsis. Suspicious pulmonary nodule seen on CT cervical spine earlier today EXAM: CT CHEST, ABDOMEN, AND PELVIS WITH CONTRAST TECHNIQUE: Multidetector CT imaging of the chest, abdomen and pelvis was performed following the standard protocol during bolus administration of intravenous contrast. RADIATION DOSE REDUCTION: This exam was performed according to the departmental dose-optimization program which includes automated exposure control, adjustment of the mA and/or kV according to patient size and/or use of iterative reconstruction technique. CONTRAST:  75mL OMNIPAQUE  IOHEXOL  350 MG/ML SOLN COMPARISON:  CT cervical spine earlier today; CT chest 06/20/2020; PET/CT 02/18/2011 FINDINGS: CT CHEST FINDINGS Cardiovascular: Coronary artery and aortic atherosclerotic calcification. No pericardial effusion. Normal caliber  thoracic aorta. CABG. Mediastinum/Nodes: Wispy secretions in the trachea. Small hiatal hernia. No thoracic adenopathy. Left-sided soft tissue nodule posterior to the hyoid bone. See dedicated CT neck for details. Lungs/Pleura: Numerous bilateral pulmonary nodules suspicious for metastatic disease. The largest on the right is in the right lower lobe and measures 2.2 x 2.3 cm (series 5/image 104) the largest in the left is in the left upper lobe and measures 2.1 x 1.3 cm (5/74). Unchanged fullness in the left infrahilar region due to postradiation changes. Consolidation and bronchiectatic changes in the treated left lung following left lower lobectomy. Small left-sided pleural effusion. Small left pleural effusion. No pneumothorax. Musculoskeletal: No acute fracture or destructive osseous lesion. Sternotomy. CT ABDOMEN PELVIS FINDINGS Hepatobiliary: Cholelithiasis. No evidence of acute cholecystitis. No biliary dilation. No focal hepatic lesion. Pancreas: Unremarkable. Spleen: Unremarkable. Adrenals/Urinary Tract: New 1.7 x 1.3 cm nodule in the right adrenal gland concerning for  metastasis. Unremarkable left adrenal gland. Cortical renal scarring both kidneys. No urinary calculi or hydronephrosis. Unremarkable bladder. Stomach/Bowel: Normal caliber large and small bowel. No bowel wall thickening. Small hiatal hernia. Stomach is otherwise within normal limits. Vascular/Lymphatic: Advanced aortic atherosclerotic calcification. 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 No lymphadenopathy. Reproductive: Enlarged prostate indenting the floor of the bladder. Other: No free intraperitoneal fluid or air. Musculoskeletal: No acute fracture. Lytic lesion with surrounding sclerosis and cortical breakthrough and extraosseous soft tissue component in the L4 vertebral body consistent with metastasis. This measures 2.2 x 2.0 cm. IMPRESSION: 1. Numerous bilateral pulmonary nodules consistent with metastatic disease. 2. New 1.7 cm nodule in the right adrenal gland concerning for metastasis. 3. Lytic lesion with surrounding sclerosis and cortical breakthrough and extraosseous soft tissue component in the L4 vertebral body consistent with metastasis. 4. Chronic post surgical and radiation changes to the left lower lung. Small left pleural effusion. 5. Soft tissue nodule in the left neck at the level of the hyoid bone suspicious for primary malignancy or metastasis. See separate report of CT neck for details. 6. Cholelithiasis. 7. 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 8. Aortic Atherosclerosis (ICD10-I70.0). Electronically Signed   By: Norman Gatlin M.D.   On: 05/31/2024 00:29   CT Cervical Spine Wo Contrast Result Date: 05/30/2024 CLINICAL DATA:  Neck trauma (Age >= 65y) EXAM: CT CERVICAL SPINE WITHOUT CONTRAST TECHNIQUE: Multidetector CT imaging of the cervical spine was performed without intravenous contrast. Multiplanar CT image reconstructions were also generated. RADIATION DOSE REDUCTION: This exam was performed according to the departmental dose-optimization program which includes  automated exposure control, adjustment of the mA and/or kV according to patient size and/or use of iterative reconstruction technique. COMPARISON:  None Available. FINDINGS: Alignment: Normal. Skull base and vertebrae: No acute fracture. Vertebral body heights are maintained. The dens and skull base are intact. Soft tissues and spinal canal: No prevertebral fluid or swelling. No visible canal hematoma. Disc levels: Multilevel degenerative change throughout the cervical spine. Disc space narrowing and spurring at multiple levels with facet hypertrophy. Upper chest: Postsurgical change at the carotid bulb with asymmetric rounded soft tissue density, series 5, image 54. Abnormal soft tissue density in the left neck just posterior to the hyoid bone causing mass effect on the airway, series 5, image 78. There are multiple apical pulmonary nodules, for example in the left upper lobe measuring 6 mm series 4, image 99. Right upper lobe only partially included in the field of view 8 mm series 4, image 102. Nodules are new from 2021 chest CT. Other:  Right carotid calcifications. IMPRESSION: 1. No acute fracture or subluxation of the cervical spine. 2. Multilevel degenerative change. 3. Postsurgical change at the carotid bulb with asymmetric rounded soft tissue density. Abnormal soft tissue density in the left neck just posterior to the hyoid bone causing mass effect on the airway. Findings may represent adenopathy. Recommend contrast-enhanced neck CT for further assessment. 4. Multiple apical pulmonary nodules, new from 2021 chest CT, suspicious for metastatic disease. Chest CT recommended for complete assessment. Electronically Signed   By: Andrea Gasman M.D.   On: 05/30/2024 23:13   DG Chest Port 1 View Result Date: 05/30/2024 CLINICAL DATA:  Chest pain EXAM: PORTABLE CHEST 1 VIEW COMPARISON:  05/30/2024 FINDINGS: Stable small laterally loculated left pleural effusion. Lung volumes are small and pulmonary insufflation  has diminished since prior examination. Resultant vascular crowding at the hila. No confluent pulmonary infiltrate. No pneumothorax. No pleural effusion on the right. Coronary artery bypass grafting has been performed. Cardiac size within normal limits. No acute bone abnormality IMPRESSION: 1. Pulmonary hypoinflation. 2. Stable small laterally loculated left pleural effusion. Electronically Signed   By: Dorethia Molt M.D.   On: 05/30/2024 23:11   CT Head Wo Contrast Result Date: 05/30/2024 CLINICAL DATA:  Head trauma EXAM: CT HEAD WITHOUT CONTRAST TECHNIQUE: Contiguous axial images were obtained from the base of the skull through the vertex without intravenous contrast. RADIATION DOSE REDUCTION: This exam was performed according to the departmental dose-optimization program which includes automated exposure control, adjustment of the mA and/or kV according to patient size and/or use of iterative reconstruction technique. COMPARISON:  MRI head 09/01/2021 and CT head 04/27/2006 FINDINGS: Brain: No intracranial hemorrhage, mass effect, or evidence of acute infarct. No hydrocephalus. No extra-axial fluid collection. Generalized cerebral atrophy and chronic small vessel ischemic disease. Vascular: No hyperdense vessel. Intracranial arterial calcification. Skull: No fracture or focal lesion. Sinuses/Orbits: No acute finding. Other: None. IMPRESSION: No acute intracranial abnormality. Electronically Signed   By: Norman Gatlin M.D.   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 Presents with chest pain. Fell yesterday but did not seek medical attention. EXAM: CHEST - 2 VIEW COMPARISON:  Chest CT with contrast 06/20/2020 FINDINGS: The lungs are mildly emphysematous. Some volume loss again noted on the left with left lower lobectomy. There are post treatment changes in the dorsal left mid lung best seen on  the lateral. There is increased opacity is suspected in the left base concerning for pneumonia or aspiration. The remaining lungs are clear. No substantial pleural effusion is seen. Stable mild cardiomegaly with CABG changes. Central vessel markings are normal caliber. Stable mediastinum with aortic atherosclerosis. Osteopenia and bridging enthesopathy thoracic spine with no new osseous findings. IMPRESSION: 1. Increased opacity in the left base concerning for pneumonia or aspiration. 2. Emphysema, left lower lobectomy and post treatment changes 3. Stable mild cardiomegaly with CABG changes. 4. Aortic atherosclerosis. 5. Osteopenia and bridging enthesopathy thoracic spine. Electronically Signed   By: Francis Quam M.D.   On: 05/30/2024 22:11     The total time spent in the appointment was 55 minutes encounter with patients including review of chart and various tests results, discussions about plan of care and coordination of care plan   All questions were answered. The patient knows to call the clinic with any problems, questions or concerns. No barriers to learning was detected.  Olam JINNY Brunner, NP 7/1/20251:49  PM   ADDENDUM: Hematology/Oncology Attending: The patient is seen and examined today.  His wife and daughter were at the bedside and his other daughter Niels was available by phone during the visit.  This is a very pleasant 83 years old white male who was diagnosed with a stage IIb non-small cell lung cancer, squamous cell carcinoma in March 2012.  He is status post a course of concurrent chemoradiation followed by left lower lobectomy and mediastinal lymph node dissection in July 2012 and he has been in observation since that time.  The patient was last seen in the clinic in July 2021 and he was discharged at that time after monitoring for close to 10 years with no evidence for disease recurrence.  He scented to the hospital recently 2 days ago complaining of fatigue and generalized weakness as  well as falls at home.  Was also complaining of chest pain.  During his evaluation he had CTA of the cervical spine as well as a chest abdomen pelvis and incidentally it showed abnormal soft tissue density in the left neck just posterior to the hyoid bone causing mass effect on the airway suspicious for adenopathy.  There was also multiple apical pulmonary nodules new from 2021 scan and suspicious for metastatic disease.  CT of the chest abdomen pelvis on 05/30/2024 showed numerous bilateral pulmonary nodules consistent with metastatic disease in addition to the new 1.7 cm nodule in the right adrenal gland concerning for metastasis and lytic lesion with surrounding sclerosis and cortical breakthrough extraosseous soft tissue component in the L4 vertebral body consistent with metastasis.  There was also the soft tissue nodule in the left neck at the level of the hyoid bone suspicious for primary malignancy or metastasis.  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  There was also bulky enlargement of 2.9 cm left level 2 compatible with nodal metastasis multiple additional surrounding enlarged left level 2 and rounded left level 3 lymph nodes suspicious for additional metastatic disease. The patient was seen by radiation oncology and he started palliative radiotherapy to protect his airway.  I had a lengthy discussion with the patient and his family today about his current disease condition and further investigation to confirm his diagnosis. I recommended for the patient to have biopsy of the left glottic laryngeal and hypopharyngeal mass by ENT and if this is not visible, he may benefit from biopsy of the soft tissue surrounding the L4 vertebral body or less favorable bronchoscopy and biopsy of one of the lung  nodule but this could be risky because of his large glottic laryngeal mass. I also discussed with the patient his possible treatment options and this is likely incurable condition with a stage IV head and neck cancer and his treatment will be systemic chemotherapy +/- immunotherapy.  I do not think the patient can tolerate this course of treatment.  I strongly recommend for them to consider palliative care on hospice but the family are still interested in the biopsy for confirmation of his diagnosis. Will continue with the palliative radiotherapy for now to protect his airway. Thank you for taking good care of Mr. Fortin.  Please call if you have any questions. Disclaimer: This note was dictated with voice recognition software. Similar sounding words can inadvertently be transcribed and may be missed upon  review. Sherrod MARLA Sherrod, MD

## 2024-06-01 NOTE — Progress Notes (Addendum)
 SLP Cancellation Note  Patient Details Name: ESTES LEHNER MRN: [id number] DOB: Apr 19, 1941   Cancelled treatment:       SLP consult received. Chart reviewed. Recommend direct MBSS for Mr. Heppler given findings of laryngeal mass. MBSS scheduled for 0930 this morning. Confirmed with Dr. Jesus that pt is not NPO for a procedure or surgical intervention at this time.    Lemma Tetro J Boni Maclellan 06/01/2024, 7:51 AM

## 2024-06-01 NOTE — Procedures (Signed)
 Modified Barium Swallow Study  Patient Details  Name: Rodney Christensen MRN: [id number] Date of Birth: 27-Jun-1941  Today's Date: 06/01/2024  Modified Barium Swallow completed.  Full report located under Chart Review in the Imaging Section.  History of Present Illness 83 year old with past medical history significant for hypertension, BPH, dementia, non-small cell lung cancer status post chemoradiation and left lower lobe lobectomy 2012, CAD status post CABG presents with fatigue, generalized weakness, frequent fall, labored breathing and chest pain.  Symptoms started 3 weeks ago after he fell.  Patient fell again the night of admission and was having chest discomfort.  He was brought by EMS for further evaluation.  Found to have large Left glottic, laryngeal, and hypopharyngeal mass, bulky adenopathy, numerous bilateral pulmonary nodules, right adrenal gland nodule, lytic lesion involving the L4 vertebral body, and left lower lobe consolidation.   Clinical Impression Pt presents with a moderate-severe oropharyngeal dysphagia per results of MBSS completed today.  A safe oral diet cannot be recommended at this time.   Oral deficits resulted in posterior spillage of liquids to the level of the pyriform sinuses or into the laryngeal vestibule before the swallow and inefficient oral transit with min-mod BOT residue observed following the majority of trials.   Pharyngeal deficits included reduced hyolaryngeal elevation/excursion, absent epiglottic inversion, reduced velum seal with partial nasal regurgitation of thin liquids, reduced laryngeal vestibule closure and reduced pharyngeal stripping.   Findings:  -There was aspiration with ejection and suspected x1 instance of aspiration without ejection of thin liquid residue after the swallow. Pt's shoulder position limited view of airway.  -There was shallow and deep penetration with and without ejection of nectar-thick liquids during and after the swallow.  Aspiration was not observed but delayed aspiration is expected.  -There was shallow, transient penetration of honey-thick liquids and pudding during/after the swallow. Aspiration was not observed but delayed aspiration is expected.  -Pt did appear to be sensate to majority or all of penetration/aspiration events with throat clear or cough response.  -The amount of pharyngeal residue increased with increased viscosity resulting in large pharyngeal residue post pudding trial.   Education: SLP followed up at bedside and discussed MBSS results at length with pt's wife. Pt was present though anticipate poor-no comprehension of this education. We discussed options of comfort diet vs conservative nutrition measures. Pt's wife stated plan to speak with oncologist about pt's prognosis and intervention option. We discussed plan to continue NPO except ice chips today with SLP follow up as goals of care are decided. Pt's wife verbalized good understanding of all education and all questions pertaining to swallowing were answered to her satisfaction today.   Plan: SLP will follow up for support and education pending pt's goals of care.    Factors that may increase risk of adverse event in presence of aspiration [facility] 2021): Frail or deconditioned;Dependence for feeding and/or oral hygiene;Reduced cognitive function  Swallow Evaluation Recommendations Recommendations: NPO;Ice chips PRN after oral care Medication Administration: Via alternative means Supervision: Full assist for feeding Postural changes: Stay upright 30-60 min after meals;Position pt fully upright for meals Oral care recommendations: Oral care QID (4x/day) Recommended consults: Consider Palliative care      Peyton JINNY Rummer 06/01/2024,11:23 AM

## 2024-06-02 ENCOUNTER — Ambulatory Visit: Admit: 2024-06-02 | Discharge: 2024-06-02 | Discharge status: Home / Self Care | Attending: Radiation Oncology

## 2024-06-02 ENCOUNTER — Other Ambulatory Visit: Payer: Self-pay

## 2024-06-02 ENCOUNTER — Ambulatory Visit
Admit: 2024-06-02 | Discharge: 2024-06-02 | Disposition: A | Discharge status: Home / Self Care | Attending: Radiation Oncology | Admitting: Radiation Oncology

## 2024-06-02 DIAGNOSIS — C320 Malignant neoplasm of glottis: Secondary | ICD-10-CM | POA: Diagnosis not present

## 2024-06-02 LAB — RAD ONC ARIA SESSION SUMMARY
Course Elapsed Days: 0
Course Elapsed Days: 0
Plan Fractions Treated to Date: 1
Plan Fractions Treated to Date: 2
Plan Prescribed Dose Per Fraction: 3.7 Gy
Plan Prescribed Dose Per Fraction: 3.7 Gy
Plan Total Fractions Prescribed: 4
Plan Total Fractions Prescribed: 4
Plan Total Prescribed Dose: 14.8 Gy
Plan Total Prescribed Dose: 14.8 Gy
Reference Point Dosage Given to Date: 3.7 Gy
Reference Point Dosage Given to Date: 7.4 Gy
Reference Point Session Dosage Given: 3.7 Gy
Reference Point Session Dosage Given: 3.7 Gy
Session Number: 1
Session Number: 2

## 2024-06-02 LAB — CBC
HCT: 32.6 % — ABNORMAL LOW (ref 39.0–52.0)
Hemoglobin: 10.4 g/dL — ABNORMAL LOW (ref 13.0–17.0)
MCH: 30.2 pg (ref 26.0–34.0)
MCHC: 31.9 g/dL (ref 30.0–36.0)
MCV: 94.8 fL (ref 80.0–100.0)
Platelets: 143 10*3/uL — ABNORMAL LOW (ref 150–400)
RBC: 3.44 MIL/uL — ABNORMAL LOW (ref 4.22–5.81)
RDW: 12.7 % (ref 11.5–15.5)
WBC: 9.7 10*3/uL (ref 4.0–10.5)
nRBC: 0 % (ref 0.0–0.2)

## 2024-06-02 LAB — BASIC METABOLIC PANEL WITH GFR
Anion gap: 9 (ref 5–15)
BUN: 18 mg/dL (ref 8–23)
CO2: 24 mmol/L (ref 22–32)
Calcium: 9 mg/dL (ref 8.9–10.3)
Chloride: 109 mmol/L (ref 98–111)
Creatinine, Ser: 1.14 mg/dL (ref 0.61–1.24)
GFR, Estimated: >60 mL/min (ref >60)
Glucose, Bld: 104 mg/dL — ABNORMAL HIGH (ref 70–99)
Potassium: 3.3 mmol/L — ABNORMAL LOW (ref 3.5–5.1)
Sodium: 142 mmol/L (ref 135–145)

## 2024-06-02 LAB — STREP PNEUMONIAE URINARY ANTIGEN: Strep Pneumo Urinary Antigen: NEGATIVE

## 2024-06-02 NOTE — Progress Notes (Signed)
 PT Cancellation Note  Patient Details Name: Rodney Christensen MRN: [id number] DOB: 02/19/1941   Cancelled Treatment:    Reason Eval/Treat Not Completed: Patient at procedure or test/unavailable Pt noted to be OTF at this time, will f/u as able.  Rodney Christensen, PT, DPT 06/02/24, 9:30 AM   Rodney Christensen 06/02/2024, 9:30 AM

## 2024-06-02 NOTE — Progress Notes (Signed)
 PROGRESS NOTE    Rodney Christensen  FMW:[id number] DOB: [birth year] DOA: 05/30/2024 PCP: Chrystal Lamarr RAMAN, MD    Brief Narrative:  This 83 yrs old Male with PMH significant for hypertension, BPH, dementia, non-small cell lung cancer status post chemoradiation and left lower lobe lobectomy 2012, CAD status post CABG presents with fatigue, generalized weakness, frequent falls, labored breathing and chest pain.  Symptoms started 3 weeks ago after he fell.  Patient fell again the night of admission and was having chest discomfort.  He was brought by EMS for further evaluation. Patient was noted to be dehydrated,  sodium 146, creatinine 1.7,  lactic acid 2.0,  troponin 80, imaging most concerning for large Left glottic, laryngeal, and hypopharyngeal mass, bulky adenopathy, numerous bilateral pulmonary nodules, right adrenal gland nodule, lytic lesion involving the L4 vertebral body, and left lower lobe consolidation.  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 History of non-small cell lung cancer status post chemoradiation and left lower lobe lobectomy 2012. ENT. Dr Jesus consulted, He will discuss care with oncologist . Dr Sherrod, his primary oncologist, consulted. He will see patient today. Plan to continue NPO for now, failed MBS, wife wants to discussed with Dr Sherrod.  Per Dr Patrcia, radiation oncology, patient needs to transfer to [facility] for radiation tx.  Patient is started on radiation treatment.   Community-acquired pneumonia: CT chest : numerous pulmonary nodules, consolidation and bronchiolectatic changes.  He  presents with cough, productive. Continue with IV Unasyn.    AKI:  He presents with creatinine 1.8. Previous baseline 1.1 Continue with IV fluids. Creatinine down to 1.2 AKI Improved.      Hypernatremia:  Continue with  IV fluid D 5   Hypertension: PRN hydralazine.  Hold lisinopril, Demadex due to AKI.   CAD:  Hold lisinopril, metoprolol, statins while NPO   Dementia:  Resume Namenda  when tolerate oral.    Hypokalemia; Replete IV      Estimated body mass index is 26.78 kg/m as calculated from the following:   Height as of 03/25/24: 5' 7 (1.702 m).   Weight as of 09/18/23: 77.6 kg.  DVT prophylaxis: Heparin Code Status: Full code Family Communication: No family at bed side. Disposition Plan:   Status is: Inpatient Remains inpatient appropriate because: Severity of illness     Consultants:  Oncology Radiation therapy  Procedures:  Antimicrobials: Anti-infectives (From admission, onward)    Start     Dose/Rate Route Frequency Ordered Stop   05/31/24 0600  Ampicillin-Sulbactam (UNASYN) 3 g in sodium chloride  0.9 % 100 mL IVPB        3 g 200 mL/hr over 30 Minutes Intravenous Every 8 hours 05/31/24 0311     05/30/24 2300  Ampicillin-Sulbactam (UNASYN) 3 g in sodium chloride  0.9 % 100 mL IVPB        3 g 200 mL/hr over 30 Minutes Intravenous  Once 05/30/24 2249 05/31/24 0005      Subjective: Patient was seen and examined at bedside.  Overnight events noted. Patient appears at his baseline.  Patient is very deconditioned. Wife at bedside,  has a lot of questions for  oncologist.  Objective: Vitals:   06/01/24 1803 06/01/24 2126 06/02/24 0200 06/02/24 0548  BP: (!) 158/78 (!) 159/70 (!) 163/73 (!) 185/90  Pulse: 89 86 79 95  Resp: 12 14 14 16   Temp: 97.9 F (36.6 C) 98.3 F (36.8 C) 98.3 F (36.8 C) 99 F (37.2 C)  TempSrc: Oral Oral Oral Oral  SpO2:  97% 97% 97%  Weight: 77.5 kg     Height: 5' 6 (1.676 m)       Intake/Output Summary (Last 24  hours) at 06/02/2024 1255 Last data filed at 06/02/2024 0550 Gross per 24 hour  Intake --  Output 600 ml  Net -600 ml   Filed Weights   06/01/24 1803  Weight: 77.5 kg    Examination:  General exam: Appears calm and comfortable, not in any acute distress, deconditioned Respiratory system: Clear to auscultation. Respiratory effort normal. RR  16 Cardiovascular system: S1 & S2 heard, RRR. No JVD, murmurs, rubs, gallops or clicks.  Gastrointestinal system: Abdomen is non distended, soft and non tender.  Normal bowel sounds heard. Central nervous system: Alert and oriented X 1. No focal neurological deficits. Extremities: No edema, no cyanosis, no clubbing. Skin: No rashes, lesions or ulcers Psychiatry: Judgement and insight appear normal. Mood & affect appropriate.     Data Reviewed: I have personally reviewed following labs and imaging studies  CBC: Recent Labs  Lab 05/30/24 2209 05/30/24 2220 05/31/24 0409 06/01/24 0336 06/02/24 0605  WBC 9.9  --  11.9* 10.2 9.7  HGB 10.7* 11.2*  11.6* 11.0* 10.6* 10.4*  HCT 35.3* 33.0*  34.0* 35.2* 32.9* 32.6*  MCV 97.0  --  95.7 93.5 94.8  PLT 150  --  139* 149* 143*   Basic Metabolic Panel: Recent Labs  Lab 05/30/24 2209 05/30/24 2220 05/31/24 0409 06/01/24 0336 06/02/24 0605  NA 146* 147*  146* 146* 145 142  K 3.7 3.6  3.6 3.7 3.4* 3.3*  CL 111 109 113* 109 109  CO2 24  --  21* 25 24  GLUCOSE 92 95 91 102* 104*  BUN 39* 37* 36* 22 18  CREATININE 1.73* 1.80* 1.44* 1.25* 1.14  CALCIUM 9.0  --  8.9 8.9 9.0  MG  --   --  2.4  --   --    GFR: Estimated Creatinine Clearance: 46.4 mL/min (by C-G formula based on SCr of 1.14 mg/dL). Liver Function Tests: Recent Labs  Lab 05/30/24 2209  AST 27  ALT 15  ALKPHOS 79  BILITOT 0.5  PROT 7.1  ALBUMIN 3.2*   Recent Labs  Lab 05/30/24 2209  LIPASE 39   No results for input(s): AMMONIA in the last 168 hours. Coagulation Profile: Recent Labs  Lab 05/30/24 2313  INR  1.1   Cardiac Enzymes: No results for input(s): CKTOTAL, CKMB, CKMBINDEX, TROPONINI in the last 168 hours. BNP (last 3 results) No results for input(s): PROBNP in the last 8760 hours. HbA1C: No results for input(s): HGBA1C in the last 72 hours. CBG: No results for input(s): GLUCAP in the last 168 hours. Lipid Profile: No results for input(s): CHOL, HDL, LDLCALC, TRIG, CHOLHDL, LDLDIRECT in the last 72 hours. Thyroid  Function Tests: No results for input(s): TSH, T4TOTAL, FREET4, T3FREE, THYROIDAB in the last 72 hours. Anemia Panel: No results for input(s): VITAMINB12, FOLATE, FERRITIN, TIBC, IRON, RETICCTPCT in the last 72 hours. Sepsis Labs: Recent Labs  Lab 05/30/24 2220 05/31/24 0025 05/31/24 0409  PROCALCITON  --   --  0.10  LATICACIDVEN  2.0* 1.3  --     Recent Results (from the past 240 hours)  Culture, blood (routine x 2)     Status: None (Preliminary result)   Collection Time: 05/30/24 11:11 PM   Specimen: BLOOD  Result Value Ref Range Status   Specimen Description BLOOD RIGHT ARM  Final   Special Requests   Final    BOTTLES DRAWN AEROBIC ONLY Blood Culture results may not be optimal due to an inadequate volume of blood received in culture bottles   Culture   Final    NO GROWTH 2 DAYS Performed at [facility], [address], [place], [place] [address]    Report Status PENDING  Incomplete  Culture, blood (routine x 2)     Status: None (Preliminary result)   Collection Time: 05/31/24 12:23 AM   Specimen: BLOOD  Result Value Ref Range Status   Specimen Description BLOOD BLOOD RIGHT HAND  Final   Special Requests   Final    BOTTLES DRAWN AEROBIC AND ANAEROBIC Blood Culture adequate volume   Culture   Final    NO GROWTH 2 DAYS Performed at [facility], [address] [place], [place] [address]    Report Status PENDING  Incomplete    Radiology Studies: DG Swallowing Func-Speech Pathology Result Date:  06/01/2024 Table formatting from the original result was not included. Modified Barium Swallow Study Patient Details Name: Rodney Christensen MRN: [id number] Date of Birth: January 14, 1941 Today's Date: 06/01/2024 HPI/PMH: HPI: 83 year old with past medical history significant for hypertension, BPH, dementia, non-small cell lung cancer status post chemoradiation and left lower lobe lobectomy 2012, CAD status post CABG presents with fatigue, generalized weakness, frequent fall, labored breathing and chest pain.  Symptoms started 3 weeks ago after he fell.  Patient fell again the night of admission and was having chest discomfort.  He was brought by EMS for further evaluation.  Found to have large Left glottic, laryngeal, and hypopharyngeal mass, bulky adenopathy, numerous bilateral pulmonary nodules, right adrenal gland nodule, lytic lesion involving the L4 vertebral body, and left lower lobe consolidation. Clinical Impression: Pt presents with a moderate-severe oropharyngeal dysphagia per results of MBSS completed today.  A safe oral diet cannot be recommended at this time. Oral deficits resulted in posterior spillage of liquids to the level of the pyriform sinuses or into the laryngeal vestibule before the swallow and inefficient oral transit with min-mod BOT residue observed following the majority of trials. Pharyngeal deficits included reduced hyolaryngeal elevation/excursion, absent epiglottic inversion, reduced velum seal with partial nasal regurgitation of thin liquids, reduced laryngeal vestibule closure and reduced pharyngeal stripping. Findings: -There was aspiration with ejection and suspected x1 instance of aspiration without ejection of thin liquid residue after the swallow. Pt's shoulder position limited view of airway. -There was shallow and deep penetration with and without ejection of nectar-thick liquids during and after the swallow. Aspiration was not observed but delayed aspiration is expected. -There was  shallow, transient penetration of honey-thick liquids and pudding during/after the swallow. Aspiration was not observed but delayed aspiration is expected. -Pt did appear to be sensate to majority or all of penetration/aspiration events with throat clear or cough response. -The amount of pharyngeal residue increased with increased viscosity resulting in large pharyngeal residue post pudding trial. Education: SLP followed up at bedside and discussed MBSS results at length with pt's wife. Pt was present though anticipate poor-no comprehension of this education. We discussed options of comfort diet vs conservative nutrition measures.  Pt's wife stated plan to speak with oncologist about pt's prognosis and intervention option. We discussed plan to continue NPO except ice chips today with SLP follow up as goals of care are decided. Pt's wife verbalized good understanding of all education and all questions pertaining to swallowing were answered to her satisfaction today. Plan: SLP will follow up for support and education pending pt's goals of care.  Factors that may increase risk of adverse event in presence of aspiration Noe & Lianne 2021): Factors that may increase risk of adverse event in presence of aspiration Noe & Lianne 2021): Frail or deconditioned; Dependence for feeding and/or oral hygiene; Reduced cognitive function Recommendations/Plan: Swallowing Evaluation Recommendations Swallowing Evaluation Recommendations Recommendations: NPO; Ice chips PRN after oral care Medication Administration: Via alternative means Supervision: Full assist for feeding Postural changes: Stay upright 30-60 min after meals; Position pt fully upright for meals Oral care recommendations: Oral care QID (4x/day) Recommended consults: Consider Palliative care Treatment Plan Treatment Plan Treatment recommendations: Therapy as outlined in treatment plan below Follow-up recommendations: -- (TBD pending goals of care) Functional  status assessment: Patient has had a recent decline in their functional status and/or demonstrates limited ability to make significant improvements in function in a reasonable and predictable amount of time. Treatment frequency: Min 2x/week (pending goals of care) Treatment duration: 2 weeks (pending goals of care) Interventions: Patient/family education (pending goals of care) Recommendations Recommendations for follow up therapy are one component of a multi-disciplinary discharge planning process, led by the attending physician.  Recommendations may be updated based on patient status, additional functional criteria and insurance authorization. Assessment: Orofacial Exam: Orofacial Exam Oral Cavity - Dentition: -- (unable to view) Orofacial Anatomy: WFL Oral Motor/Sensory Function: Unable to test Anatomy: Anatomy: -- (known laryngeal mass) Boluses Administered: Boluses Administered Boluses Administered: Thin liquids (Level 0); Mildly thick liquids (Level 2, nectar thick); Moderately thick liquids (Level 3, honey thick); Puree  Oral Impairment Domain: Oral Impairment Domain Lip Closure: No labial escape Tongue control during bolus hold: Posterior escape of less than half of bolus Bolus preparation/mastication: -- (not assessed) Bolus transport/lingual motion: Delayed initiation of tongue motion (oral holding) Oral residue: Residue collection on oral structures Location of oral residue : Tongue Initiation of pharyngeal swallow : Pyriform sinuses  Pharyngeal Impairment Domain: Pharyngeal Impairment Domain Soft palate elevation: Escape to nasopharynx Laryngeal elevation: Minimal superior movement of thyroid  cartilage with minimal approximation of arytenoids to epiglottic petiole Anterior hyoid excursion: Partial anterior movement Epiglottic movement: No inversion Laryngeal vestibule closure: Incomplete, narrow column air/contrast in laryngeal vestibule Pharyngeal stripping wave : Present - diminished Pharyngeal  contraction (A/P view only): N/A Pharyngoesophageal segment opening: Complete distension and complete duration, no obstruction of flow Tongue base retraction: Narrow column of contrast or air between tongue base and PPW Pharyngeal residue: Majority of contrast within or on pharyngeal structures Location of pharyngeal residue: Tongue base; Pharyngeal wall; Valleculae; Diffuse (>3 areas)  Esophageal Impairment Domain: Esophageal Impairment Domain Esophageal clearance upright position: -- (not assessed) Pill: Pill Consistency administered: -- (not assessed) Penetration/Aspiration Scale Score: Penetration/Aspiration Scale Score 2.  Material enters airway, remains ABOVE vocal cords then ejected out: Mildly thick liquids (Level 2, nectar thick); Puree; Moderately thick liquids (Level 3, honey thick) 3.  Material enters airway, remains ABOVE vocal cords and not ejected out: Thin liquids (Level 0); Mildly thick liquids (Level 2, nectar thick) 4.  Material enters airway, CONTACTS cords then ejected out: Thin liquids (Level 0); Mildly thick liquids (Level 2, nectar thick) 5.  Material enters airway, CONTACTS cords and not ejected out: Thin liquids (Level 0) 6.  Material enters airway, passes BELOW cords then ejected out: Thin liquids (Level 0) 7.  Material enters airway, passes BELOW cords and not ejected out despite cough attempt by patient: Thin liquids (Level 0) Compensatory Strategies: Compensatory Strategies Compensatory strategies: Yes Straw: Ineffective Ineffective Straw: Thin liquid (Level 0); Mildly thick liquid (Level 2, nectar thick) Chin tuck: Ineffective Ineffective Chin Tuck: Thin liquid (Level 0) Liquid wash: Effective Effective Liquid Wash: Mildly thick liquid (Level 2, nectar thick) Left head turn: Ineffective Ineffective Left Head Turn: Mildly thick liquid (Level 2, nectar thick) Right head turn: Ineffective Ineffective Right Head Turn: Mildly thick liquid (Level 2, nectar thick)   General Information:  Caregiver present: Yes  Diet Prior to this Study: NPO   Temperature : Normal   Respiratory Status: WFL   Supplemental O2: None (Room air)   History of Recent Intubation: No  Behavior/Cognition: Alert; Requires cueing; Confused No data recorded Baseline vocal quality/speech: -- (intermittent wet voicing noted) Volitional Cough: -- (inconsistent) Volitional Swallow: -- (inconsistent) Exam Limitations: Limited visibility (due to shoulder position) Goal Planning: Prognosis for improved oropharyngeal function: Guarded Barriers to Reach Goals: Cognitive deficits; Severity of deficits; Overall medical prognosis No data recorded Patient/Family Stated Goal: none stated Consulted and agree with results and recommendations: Patient; Nurse; Physician; Family member/caregiver Pain: No data recorded End of Session: Start Time:SLP Start Time (ACUTE ONLY): 0930 Stop Time: SLP Stop Time (ACUTE ONLY): 1000 Start Time:SLP Start Time (ACUTE ONLY): 1009 Stop Time: SLP Stop Time (ACUTE ONLY): 1038 Time Calculation:SLP Time Calculation (min) (ACUTE ONLY): 30 min Time Calculation:SLP Time Calculation (min) (ACUTE ONLY): 29 min Charges: SLP Evaluations $ SLP Speech Visit: 1 Visit SLP Evaluations $MBS Swallow: 1 Procedure $Swallowing Treatment: 1 Procedure SLP visit diagnosis: SLP Visit Diagnosis: Dysphagia, oropharyngeal phase (R13.12) Past Medical History: Past Medical History: Diagnosis Date  BPH (benign prostatic hyperplasia)   CAD, NATIVE VESSEL 03/22/2010  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  Diverticulosis   History of radiation therapy 03/18/11 to 04/19/11  lower left lung  HYPERLIPIDEMIA-MIXED 03/17/2009  HYPERTENSION, UNSPECIFIED 03/17/2009  Macular degeneration   Skin cancer   melanoma-nose  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HCC)   Left lower lobe  Past Surgical History: Past Surgical History: Procedure Laterality Date  BRONCHOSCOPY  06/11/11  [facility]  CAROTID ENDARTERECTOMY    CORONARY ARTERY BYPASS GRAFT  10/10/2004  x5  Lt VATS,Lt  thoacotomy,Lt lower lobectomy with  mediastinal node  dissection  06/11/11  [facility] Allyson JINNY Rummer 06/01/2024, 11:33 AM  Scheduled Meds:  heparin  5,000 Units Subcutaneous Q8H   sodium chloride  flush  3 mL Intravenous Q12H   Continuous Infusions:  ampicillin-sulbactam (UNASYN) IV 3 g (06/02/24 0612)     LOS: 2 days    Time spent: 50 mins    Darcel Dawley, MD [facility]   If 7PM-7AM, please contact night-coverage

## 2024-06-02 NOTE — Plan of Care (Signed)

## 2024-06-02 NOTE — Progress Notes (Signed)
 SLP Cancellation Note  Patient Details Name: Rodney Christensen MRN: [id number] DOB: September 08, 1941   Cancelled treatment:       Reason Eval/Treat Not Completed: Patient at procedure or test/unavailable - pt care. ST will continue to follow. Recommend consideration of Palliative Care consult to facilitate establishment of appropriate goals of care.   Coran Dipaola B. Dory, MSP, CCC-SLP Speech Language Pathologist  Dory Caprice Daring 06/02/2024, 1:07 PM

## 2024-06-02 NOTE — Progress Notes (Signed)
 Paged provider   patient had a small brief nose bleed bright red estimated at 15 mls last Hgb was 10.4 this morning, he is do for Heparin 5000u now , do you want to hold and do more labs or are you ok with giving this dose and continuing to monitor ?   Awaiting response

## 2024-06-02 NOTE — Evaluation (Signed)
 Occupational Therapy Evaluation Patient Details Name: Rodney Christensen MRN: [id number] DOB: [birth year] Today's Date: 06/02/2024   History of Present Illness   Pt is an 83 y/o M admitted on 05/30/24 after presenting with c/o fatigue, weakness, frequent falls, chest pain & labored breathing x 3 weeks. 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 PMH: HTN, BPH, dementia, non-small cell lung CA s/p chemoradiation & LLL lobectomy 2012, CAD s/p CABG     Clinical Impressions Patient is currently requiring as high as total assistance with basic ADLs, as well as  maximum assist with bed mobility and moderate assist with functional transfers to chair with use of RW and need of multimodal cues for safety and sequencing.   Current level of function is below patient's typical baseline.    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    Patient lives with his spouse who  unable to provide the amount of supervision and assistance that pt current requires.  Patient demonstrates fair to good rehab potential, and should benefit from continued skilled occupational therapy services while in acute care to maximize safety, independence and quality of life at home.  Continued occupational therapy services after discharge from acute care from continued inpatient follow up therapy, <3 hours/day is recommended.   ?      If plan is discharge home, recommend the following:   A lot of help with walking and/or transfers;A lot of help with bathing/dressing/bathroom;Direct  supervision/assist for financial management;Supervision due to cognitive status;Assistance with cooking/housework;Assistance with feeding;Assist for transportation;Help with stairs or ramp for entrance;Direct supervision/assist for medications management     Functional Status Assessment   Patient has had a recent decline in their functional status and demonstrates the ability to make significant improvements in function in a reasonable and predictable amount of time.     Equipment Recommendations   BSC/3in1 (Defer to next LOC)     Recommendations for Other Services         Precautions/Restrictions         Mobility Bed Mobility Overal bed mobility: Needs Assistance Bed Mobility: Rolling, Sidelying to Sit Rolling: Min assist, Used rails Sidelying to sit: Max assist       General bed mobility comments: Pt cued to push up from bed rail to sitting. Pt repeating, I can't and letting go of rail. Pt then provided with Max As at trunk after given Max-Total Assist to guide LEs off EOB.    Transfers                          Balance Overall balance assessment: Needs assistance Sitting-balance support: No upper extremity supported, Feet supported Sitting balance-Leahy Scale: Fair     Standing balance support: Reliant on assistive device for balance, During functional activity, Bilateral upper extremity supported Standing balance-Leahy Scale: Poor                             ADL either performed or assessed with clinical judgement   ADL Overall ADL's : Needs assistance/impaired Eating/Feeding: Maximal assistance;Sitting;Cueing  for sequencing   Grooming: Sitting;Set up;Supervision/safety Grooming Details (indicate cue type and reason): Repeatedly blowing nose. Noted blood and RN notified.  Kleenex bx moved around pt and pt able to locate it with vision. Upper Body Bathing: Moderate assistance;Sitting   Lower Body Bathing: Maximal assistance;+2 for  physical assistance;Bed level;Cueing for sequencing   Upper Body Dressing : Moderate assistance;Sitting;Cueing for sequencing   Lower Body Dressing: Total assistance;Bed level (to don socks)   Toilet Transfer: Moderate assistance;Stand-pivot;Cueing for sequencing;Cueing for safety;Rolling walker (2 wheels) Toilet Transfer Details (indicate cue type and reason): Pt stood from EOB to RW with Mod As. Pt performed stand-step to recliner, and attempted to leave RW behind. Pt given hand over hand and verbal cues to bring Bil hands back to RW for safety. Pt descended to recliner with Mod As for control. Toileting- Clothing Manipulation and Hygiene: Maximal assistance;Total assistance;Sitting/lateral lean Toileting - Clothing Manipulation Details (indicate cue type and reason): Can be incontinent and spouse has been providing Total Assist peri hygiene for 1-2 weeks.     Functional mobility during ADLs: Moderate assistance;Cueing for safety;Cueing for sequencing;Rolling walker (2 wheels)       Vision Baseline Vision/History: 2 Legally blind;6 Macular Degeneration;3 Glaucoma Ability to See in Adequate Light: 4 Severely impaired Patient Visual Report: Central vision impairment;Peripheral vision impairment Additional Comments: Spouse reports that pt's vision worsened in past 2 weeks from impaired to nothing, black. Spouse reports that she believes this is due to a recent fall in June.     Perception         Praxis         Pertinent Vitals/Pain Pain Assessment Pain Assessment: Faces Faces Pain Scale: Hurts little more Pain Location: Feet when socks donned for pt. Spouse reports h/o caluses and subsequent foot tenderness. Pain Descriptors / Indicators: Grimacing, Guarding Pain Intervention(s): Limited activity within patient's tolerance, Monitored during session, Repositioned     Extremity/Trunk Assessment Upper Extremity Assessment Upper Extremity Assessment: Generalized weakness    Lower Extremity Assessment Lower Extremity Assessment: Generalized weakness   Cervical / Trunk Assessment Cervical / Trunk Assessment: Other exceptions Cervical / Trunk Exceptions: Stiff when mobilizing.   Communication Communication Communication: No apparent difficulties Factors Affecting Communication:  (Spouse is HOH. Niece requests speaking loudly so spouse can hear.)   Cognition Arousal:  (alert once EOB) Behavior During Therapy: Flat affect Cognition: History of cognitive impairments, Cognition impaired   Orientation impairments: Situation, Time, Place Awareness: Intellectual awareness impaired, Online awareness impaired Memory impairment (select all impairments): Short-term memory, Working memory, Engineer, structural memory Attention impairment (select first level of impairment): Sustained attention Executive functioning impairment (select all impairments): Initiation, Reasoning, Problem solving OT - Cognition Comments: Alzheimer's dementia baseline.                 Following commands: Impaired Following commands impaired: Follows one step commands with increased time, Follows one step commands inconsistently     Cueing  General Comments   Cueing Techniques: Verbal cues;Tactile cues      Exercises     Shoulder Instructions      Home Living Family/patient expects to be discharged to:: Private residence Living Arrangements: Spouse/significant other Available Help at Discharge: Family Type of Home: House Home Access: Stairs to enter Secretary/administrator of Steps: 5 Entrance Stairs-Rails: Right Home Layout: One level     Bathroom Shower/Tub: Producer, television/film/video: Handicapped height Bathroom Accessibility: Yes How Accessible: Accessible via wheelchair Home Equipment: Agricultural consultant (2 wheels);Cane - single  point;Wheelchair - manual;Grab bars - tub/shower   Additional Comments: Pt and niece report a gradual decline in function with all  things including ADLs, B&B continence, mobility, cogniton and vision.      Prior Functioning/Environment Prior Level of Function : History of Falls (last six months)  Cognitive Assist : ADLs (cognitive)   ADLs (Cognitive): Step by step cues Physical Assist : ADLs (physical)   ADLs (physical): IADLs;Toileting;Dressing;Bathing;Grooming;Feeding (Past 1 week to 1 month of requiring assisatnce with all. This was not pt's baseline prior to 1 month ago where pt required light assistance and cues.) Mobility Comments: Pt began using SPC in December 2024, transitioned to RW a few months ago but in the past month & a half pt has been requiring increased assistance, wife assisting him in/OOB, transfer to w/c with RW. At least 2 falls in the past 6 months.  Family reports significant decline in last month. Spouse used to be able to just verbally cue pt, now pt needs hands on assistance for all ADLs and mobility. ADLs Comments: Wife providing assistance for bathing/dressing x ~6 months. Spouse had to begin spoon feeding pt for past 1-2 weeks due to decreasing appetite.  Spouse reports pt almost fell in shower while she assisted him out of the shower the last time and no longer thinks she can care for him.    OT Problem List: Decreased strength;Impaired vision/perception;Decreased knowledge of use of DME or AE;Decreased knowledge of precautions;Decreased activity tolerance;Decreased cognition;Pain;Decreased safety awareness;Impaired balance (sitting and/or standing)   OT Treatment/Interventions: Self-care/ADL training;Balance training;Therapeutic activities;Cognitive remediation/compensation;DME and/or AE instruction;Visual/perceptual remediation/compensation      OT Goals(Current goals can be found in the care plan section)   Acute Rehab OT Goals Patient Stated Goal: To stand up and make it to the chair without help. OT Goal Formulation: With patient Time For Goal Achievement: 06/16/24 Potential to  Achieve Goals: Good ADL Goals Pt Will Perform Eating: with min assist;with caregiver independent in assisting Pt Will Perform Grooming: sitting;with set-up;with supervision;with caregiver independent in assisting Pt Will Perform Upper Body Dressing: with min assist;sitting;with caregiver independent in assisting Pt Will Perform Lower Body Dressing: with mod assist;with caregiver independent in assisting;sitting/lateral leans;sit to/from stand;bed level Pt Will Transfer to Toilet: with contact guard assist;ambulating;regular height toilet Pt Will Perform Toileting - Clothing Manipulation and hygiene: with min assist;with caregiver independent in assisting;with adaptive equipment;sitting/lateral leans;sit to/from stand (educate on bidet to decrease CG burden)   OT Frequency:  Min 2X/week    Co-evaluation              AM-PAC OT 6 Clicks Daily Activity     Outcome Measure Help from another person eating meals?: A Lot Help from another person taking care of personal grooming?: A Little Help from another person toileting, which includes using toliet, bedpan, or urinal?: Total Help from another person bathing (including washing, rinsing, drying)?: A Lot Help from another person to put on and taking off regular upper body clothing?: A Lot Help from another person to put on and taking off regular lower body clothing?: Total 6 Click Score: 11   End of Session Equipment Utilized During Treatment: Gait belt;Rolling walker (2 wheels) Nurse Communication: Mobility status  Activity Tolerance: Patient tolerated treatment well Patient left: in chair;with call bell/phone within reach;with chair alarm set;with nursing/sitter in room;with family/visitor present  OT Visit Diagnosis: Unsteadiness on feet (R26.81);Low vision, both eyes (H54.2);Repeated falls (R29.6);Muscle weakness (generalized) (M62.81);History of falling (Z91.81);Other symptoms and signs involving cognitive function;Pain Pain -  Right/Left:  (bil) Pain - part of body: Ankle and joints of foot                Time: 1333-1415 OT Time Calculation (min): 42 min Charges:  OT General Charges $OT Visit: 1 Visit OT Evaluation $OT Eval Moderate Complexity: 1 Mod OT Treatments $Self Care/Home Management : 8-22 mins $Therapeutic Activity: 8-22 mins  Delon, OT Acute Rehab Services Office: [phone] 06/02/2024   Delon Falter 06/02/2024, 2:32 PM

## 2024-06-02 NOTE — Evaluation (Signed)
 Physical Therapy Evaluation Patient Details Name: Rodney Christensen MRN: [id number] DOB: 1941-02-25 Today's Date: 06/02/2024  History of Present Illness  Pt is an 83 y/o M admitted on 05/30/24 after presenting with c/o fatigue, weakness, frequent falls, chest pain & labored breathing x 3 weeks. 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 PMH: HTN, BPH, dementia, non-small cell lung CA s/p chemoradiation & LLL lobectomy 2012, CAD s/p CABG  Clinical Impression  Pt seen for PT evaluation with pt asleep but awakened & agreeable to tx. Pt's wife & niece in room, providing PLOF, home set up information. Pt has experienced a functional decline since December, but most rapid decline in the past month & a half. Wife was most recently assisting him with transfers to/from w/c with RW, but ~3 months ago pt was able to ambulate with a Proofreader. Attempted to assist pt with supine>sit but pt limited by impaired cognition & decreased vision. Pt's condom catheter becomes disconnected, urine spilled on bed. Pt was able to roll L<>R with mod assist with bed rails to allow PT to change bed linens. Pt's wife is unable to provide current level of care that pt needs. Recommend post acute rehab <3 hours therapy/day upon d/c to maximize independence with mobility to decrease fall risk & decrease caregiver burden.  Wife reports pt is completely blind.        If plan is discharge home, recommend the following: Two people to help with walking and/or transfers;Two people to help with bathing/dressing/bathroom   Can travel by private vehicle   No    Equipment Recommendations Other (comment) (defer to next venue)  Recommendations for Other Services       Functional Status Assessment Patient has had a recent decline in their functional status and demonstrates the ability to make significant improvements in function in a reasonable and predictable amount of time.      Precautions / Restrictions Precautions Precautions: Fall Restrictions Weight Bearing Restrictions Per Provider Order: No      Mobility  Bed Mobility Overal bed mobility: Needs Assistance Bed Mobility: Rolling Rolling: Mod assist, Used rails         General bed mobility comments: Attempted to assist pt with supine>sit, wife even attempted to encourage pt, but pt with decreased ability to follow commands. Pt rolled L<>R to allow PT to change bed linens with mod assist for rolling, cuing re: hand placement.    Transfers                        Ambulation/Gait                  Stairs            Wheelchair Mobility     Tilt Bed    Modified Rankin (Stroke Patients Only)       Balance                                             Pertinent Vitals/Pain Pain Assessment Pain Assessment: Faces Faces Pain Scale: Hurts a little bit Pain Location: generalized Pain Descriptors / Indicators: Grimacing Pain Intervention(s): Monitored during session, Repositioned    Home Living Family/patient expects to be discharged to:: Private residence Living Arrangements: Spouse/significant other Available Help at Discharge: Family Type of Home: House Home Access:  Stairs to enter Entrance Stairs-Rails: Right Entrance Stairs-Number of Steps: 5   Home Layout: One level Home Equipment: Agricultural consultant (2 wheels);Cane - single point;Wheelchair - manual      Prior Function               Mobility Comments: Pt began using SPC in December 2024, transitioned to RW a few months ago but in the past month & a half pt has been requiring increased assistance, wife assisting him in/OOB, transfer to w/c with RW. At least 1 fall in the past 6 months. ADLs Comments: Wife providing assistance for bathing/dressing x ~6 months.     Extremity/Trunk Assessment   Upper Extremity Assessment Upper Extremity Assessment: Generalized weakness (redness to  upper RUE)    Lower Extremity Assessment Lower Extremity Assessment: Generalized weakness       Communication        Cognition Arousal: Alert Behavior During Therapy: Flat affect   PT - Cognitive impairments: Orientation, Awareness, Memory, Attention, Initiation, Sequencing, Problem solving, Safety/Judgement                         Following commands: Impaired Following commands impaired: Follows one step commands with increased time, Follows one step commands inconsistently     Cueing Cueing Techniques: Verbal cues, Tactile cues     General Comments      Exercises     Assessment/Plan    PT Assessment Patient needs continued PT services  PT Problem List Decreased strength;Pain;Decreased cognition;Decreased activity tolerance;Decreased balance;Decreased mobility;Decreased knowledge of use of DME;Decreased safety awareness;Decreased skin integrity       PT Treatment Interventions DME instruction;Balance training;Gait training;Neuromuscular re-education;Stair training;Functional mobility training;Therapeutic activities;Therapeutic exercise;Patient/family education    PT Goals (Current goals can be found in the Care Plan section)  Acute Rehab PT Goals Patient Stated Goal: get stronger PT Goal Formulation: With family Time For Goal Achievement: 06/16/24 Potential to Achieve Goals: Fair    Frequency Min 2X/week     Co-evaluation               AM-PAC PT 6 Clicks Mobility  Outcome Measure Help needed turning from your back to your side while in a flat bed without using bedrails?: A Lot Help needed moving from lying on your back to sitting on the side of a flat bed without using bedrails?: Total Help needed moving to and from a bed to a chair (including a wheelchair)?: Total Help needed standing up from a chair using your arms (e.g., wheelchair or bedside chair)?: Total Help needed to walk in hospital room?: Total Help needed climbing 3-5 steps with  a railing? : Total 6 Click Score: 7    End of Session   Activity Tolerance: Patient tolerated treatment well Patient left: in bed;with call bell/phone within reach;with bed alarm set;with family/visitor present;with nursing/sitter in room Nurse Communication:  (notified secretary that pt needs new bed, acctuator on current bed appears to be broken) PT Visit Diagnosis: Muscle weakness (generalized) (M62.81);Difficulty in walking, not elsewhere classified (R26.2)    Time: 8756-8693 PT Time Calculation (min) (ACUTE ONLY): 23 min   Charges:   PT Evaluation $PT Eval Moderate Complexity: 1 Mod   PT General Charges $$ ACUTE PT VISIT: 1 Visit         Richerd Pinal, PT, DPT 06/02/24, 1:25 PM   Richerd CHRISTELLA Pinal 06/02/2024, 1:23 PM

## 2024-06-02 NOTE — Progress Notes (Signed)
 Hold Hep per provider, will continue to monitor

## 2024-06-03 ENCOUNTER — Ambulatory Visit
Admit: 2024-06-03 | Discharge: 2024-06-03 | Disposition: A | Discharge status: Home / Self Care | Attending: Radiation Oncology | Admitting: Radiation Oncology

## 2024-06-03 ENCOUNTER — Other Ambulatory Visit: Payer: Self-pay

## 2024-06-03 ENCOUNTER — Ambulatory Visit
Admission: RE | Admit: 2024-06-03 | Discharge: 2024-06-03 | Disposition: A | Discharge status: Home / Self Care | Source: Ambulatory Visit | Attending: Radiation Oncology | Admitting: Radiation Oncology

## 2024-06-03 DIAGNOSIS — C320 Malignant neoplasm of glottis: Secondary | ICD-10-CM | POA: Diagnosis not present

## 2024-06-03 LAB — CBC
HCT: 32.6 % — ABNORMAL LOW (ref 39.0–52.0)
Hemoglobin: 10.2 g/dL — ABNORMAL LOW (ref 13.0–17.0)
MCH: 30 pg (ref 26.0–34.0)
MCHC: 31.3 g/dL (ref 30.0–36.0)
MCV: 95.9 fL (ref 80.0–100.0)
Platelets: 137 10*3/uL — ABNORMAL LOW (ref 150–400)
RBC: 3.4 MIL/uL — ABNORMAL LOW (ref 4.22–5.81)
RDW: 12.8 % (ref 11.5–15.5)
WBC: 8.9 10*3/uL (ref 4.0–10.5)
nRBC: 0 % (ref 0.0–0.2)

## 2024-06-03 LAB — BASIC METABOLIC PANEL WITH GFR
Anion gap: 9 (ref 5–15)
BUN: 19 mg/dL (ref 8–23)
CO2: 20 mmol/L — ABNORMAL LOW (ref 22–32)
Calcium: 8.9 mg/dL (ref 8.9–10.3)
Chloride: 115 mmol/L — ABNORMAL HIGH (ref 98–111)
Creatinine, Ser: 1.22 mg/dL (ref 0.61–1.24)
GFR, Estimated: 58 mL/min — ABNORMAL LOW (ref >60)
Glucose, Bld: 82 mg/dL (ref 70–99)
Potassium: 3.1 mmol/L — ABNORMAL LOW (ref 3.5–5.1)
Sodium: 144 mmol/L (ref 135–145)

## 2024-06-03 LAB — RAD ONC ARIA SESSION SUMMARY
Course Elapsed Days: 1
Course Elapsed Days: 1
Plan Fractions Treated to Date: 3
Plan Fractions Treated to Date: 4
Plan Prescribed Dose Per Fraction: 3.7 Gy
Plan Prescribed Dose Per Fraction: 3.7 Gy
Plan Total Fractions Prescribed: 4
Plan Total Fractions Prescribed: 4
Plan Total Prescribed Dose: 14.8 Gy
Plan Total Prescribed Dose: 14.8 Gy
Reference Point Dosage Given to Date: 11.1 Gy
Reference Point Dosage Given to Date: 14.8 Gy
Reference Point Session Dosage Given: 3.7 Gy
Reference Point Session Dosage Given: 3.7 Gy
Session Number: 3
Session Number: 4

## 2024-06-03 LAB — URINE CULTURE: Culture: NO GROWTH

## 2024-06-03 LAB — LEGIONELLA PNEUMOPHILA SEROGP 1 UR AG: L. pneumophila Serogp 1 Ur Ag: NEGATIVE

## 2024-06-03 MED ORDER — POTASSIUM CHLORIDE 20 MEQ PO PACK: 40.0000 meq | PACK | Freq: Once | ORAL | Status: DC

## 2024-06-03 MED ORDER — POTASSIUM CHLORIDE 10 MEQ/100ML IV SOLN
10.0000 meq | INTRAVENOUS | Status: AC
  Administered 2024-06-03 (×3): 10 meq via INTRAVENOUS
  Filled 2024-06-03: qty 100

## 2024-06-03 NOTE — TOC Initial Note (Signed)
 Transition of Care [facility]) - Initial/Assessment Note    Patient Details  Name: Rodney Christensen MRN: [id number] Date of Birth: 1941/09/22  Transition of [facility]) CM/SW Contact:    Toy LITTIE Agar, RN Phone [phone]  06/03/2024, 2:34 PM  Clinical Narrative:                 TOC at bedside to discuss disposition planning. Wife at bedside states that MD has told her that there is nothing else that can be done for the patient and that Hospice would be appropriate. CM offered spouse a choice from [email or web address] ist. Wife requesting [facility]. Referral has been sent to [facility]. Wife states that she is not able to care for patient in his current condition. TOC following for disposition planning.   Expected Discharge Plan: Hospice Medical Facility Barriers to Discharge: Continued Medical Work up   Patient Goals and CMS Choice Patient states their goals for this hospitalization and ongoing recovery are:: Patient unable to answer. Wife states plan is for residential hospice. CMS Medicare.gov Compare Post Acute Care list provided to:: Patient Represenative (must comment) (Wife) Choice offered to / list presented to : Spouse [place] ownership interest in [facility].provided to::  (n/a)    Expected Discharge Plan and Services In-house Referral: NA Discharge Planning Services: NA Post Acute Care Choice: Hospice Living arrangements for the past 2 months: Single Family Home                           HH Arranged: NA HH Agency: NA        Prior Living Arrangements/Services Living arrangements for the past 2 months: Single Family Home Lives with:: Spouse Patient language and need for interpreter reviewed:: Yes Do you feel safe going back to the place where you live?: Yes      Need for Family Participation in Patient Care: Yes (Comment) Care giver support system in place?: Yes (comment)   Criminal Activity/Legal Involvement Pertinent to Current  Situation/Hospitalization: No - Comment as needed  Activities of Daily Living   ADL Screening (condition at time of admission) Independently performs ADLs?: No Does the patient have a NEW difficulty with bathing/dressing/toileting/self-feeding that is expected to last >3 days?: No Does the patient have a NEW difficulty with getting in/out of bed, walking, or climbing stairs that is expected to last >3 days?: No Does the patient have a NEW difficulty with communication that is expected to last >3 days?: No Is the patient deaf or have difficulty hearing?: Yes Does the patient have difficulty seeing, even when wearing glasses/contacts?: No Does the patient have difficulty concentrating, remembering, or making decisions?: Yes  Permission Sought/Granted Permission sought to share information with : Family Supports Permission granted to share information with : No, Yes, Verbal Permission Granted  Share Information with NAME: Thi Sisemore     Permission granted to share info w Relationship: wife  Permission granted to share info w Contact Information: [phone]  Emotional Assessment Appearance:: Appears stated age Attitude/Demeanor/Rapport: Unable to Assess Affect (typically observed): Unable to Assess Orientation: : Oriented to Self   Psych Involvement: No (comment)  Admission diagnosis:  Metastatic disease (HCC) [C79.9] AKI (acute kidney injury) (HCC) [N17.9] Pneumonia of left upper lobe due to infectious organism [J18.9] Metastatic malignant neoplasm, unspecified site [facility]) [C79.9] Patient Active Problem List   Diagnosis Date Noted   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/31/2024   AKI (acute  kidney injury) (HCC) 05/31/2024   Pneumonia 05/31/2024   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   Malignant neoplasm of lower lobe, bronchus, or lung 03/27/2012   History of radiation therapy    Skin cancer    BPH (benign prostatic  hyperplasia)    Squamous cell carcinoma lung, Left lower lobe    CAD, NATIVE VESSEL 03/22/2010   HYPERLIPIDEMIA-MIXED 03/17/2009   Essential hypertension 03/17/2009   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   CAROTID ENDARTERECTOMY, LEFT, HX OF 03/17/2009   PCP:  Chrystal Lamarr RAMAN, MD Pharmacy:   [facility] [phone] [address], [place] - [address] AT [facility] OF [address] & [facility] [address] [place] [address] Phone: [phone] Fax: [phone]  [facility] [address] [place] [address] Phone: [phone] Fax: [phone]     Social Drivers of Health (SDOH) Social History: SDOH Screenings   Food Insecurity: No Food Insecurity (05/31/2024)  Housing: Low Risk  (05/31/2024)  Transportation Needs: No Transportation Needs (05/31/2024)  Utilities: Not At Risk (05/31/2024)  Social Connections: Socially Isolated (05/31/2024)  Tobacco Use: Medium Risk (05/31/2024)   SDOH Interventions:     Readmission Risk Interventions     No data to display

## 2024-06-03 NOTE — Plan of Care (Signed)

## 2024-06-03 NOTE — Progress Notes (Signed)
 PROGRESS NOTE    Rodney Christensen  FMW:[id number] DOB: 19-Mar-1941 DOA: 05/30/2024 PCP: Chrystal Lamarr RAMAN, MD    Brief Narrative:  This 83 yrs old Male with PMH significant for hypertension, BPH, dementia, non-small cell lung cancer status post chemoradiation and left lower lobe lobectomy 2012, CAD status post CABG presents with fatigue, generalized weakness, frequent falls, labored breathing and chest pain.  Symptoms started 3 weeks ago after he fell.  Patient fell again the night of admission and was having chest discomfort.  He was brought by EMS for further evaluation. Patient was noted to be dehydrated,  sodium 146, creatinine 1.7,  lactic acid 2.0,  troponin 80, imaging most concerning for large Left glottic, laryngeal, and hypopharyngeal mass, bulky adenopathy, numerous bilateral pulmonary nodules, right adrenal gland nodule, lytic lesion involving the L4 vertebral body, and left lower lobe consolidation.  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 History of non-small cell lung cancer status post chemoradiation and left lower lobe lobectomy 2012. ENT. Dr Jesus consulted, He will discuss care with oncologist. Dr Sherrod, his primary oncologist, consulted. Plan to continue NPO for now, failed MBS, wife wants to discussed with Dr Sherrod.  Per Dr Patrcia, radiation oncology, patient needs to transfer to [facility] for radiation tx.  Patient is started on radiation treatment. Dr. Sherrod has long discussion with family about current disease condition and investigation to confirm diagnosis.  He recommended biopsy of the left  glottic laryngeal and laryngopharyngeal mass by ENT, given his condition and his staging for head and neck cancer his treatment will be systemic chemotherapy plus minus immunotherapy appears like a incurable condition.  Patient cannot tolerate this course of treatment . He recommended palliative care consult about hospice.   Community-acquired pneumonia: CT chest : numerous pulmonary nodules, consolidation and bronchiolectatic changes.  He presents with cough, productive. Continue with IV Unasyn.    AKI:  He presents with creatinine 1.8. Previous baseline 1.1 Continue with IV fluids. Creatinine down to 1.2 AKI Improved.    Hypernatremia:  Continued D5W water.  Now sodium improved   Hypertension: PRN hydralazine.  Hold lisinopril, Demadex due to AKI.   CAD:  Hold lisinopril, metoprolol, statins while NPO   Dementia:  Resume Namenda  when tolerate oral.    Hypokalemia; replaced.  Continue to monitor     Estimated body mass index is 26.78 kg/m as calculated from the following:   Height as of 03/25/24: 5' 7 (1.702 m).   Weight as of 09/18/23: 77.6 kg.  DVT prophylaxis: Heparin Code Status: Full code Family Communication: No family at bed side. Disposition Plan:   Status is: Inpatient Remains inpatient appropriate because: Severity of illness     Consultants:  Oncology Radiation therapy  Procedures:  Antimicrobials: Anti-infectives (From admission, onward)    Start     Dose/Rate Route Frequency Ordered Stop   05/31/24 0600  Ampicillin-Sulbactam (UNASYN) 3 g in sodium chloride  0.9 % 100 mL IVPB        3 g 200 mL/hr over 30 Minutes Intravenous Every 8 hours 05/31/24 0311     05/30/24 2300  Ampicillin-Sulbactam (UNASYN) 3 g in  sodium chloride  0.9 % 100 mL IVPB        3 g 200 mL/hr over 30 Minutes Intravenous  Once 05/30/24 2249 05/31/24 0005      Subjective: Patient was seen and examined at bedside.  Overnight events noted. Appears at his baseline mental status.   Patient is very deconditioned. Wife at bedside,  has a lot of questions for oncologist.  Objective: Vitals:   06/02/24 2027 06/03/24 0500 06/03/24 0505 06/03/24 0600  BP: (!) 188/84 (!) 191/81 (!) 191/81 (!) 166/75  Pulse: 87 89  84  Resp: 14 16    Temp: 98 F (36.7 C) (!) 97.5 F (36.4 C)    TempSrc: Oral Oral    SpO2: 97% 94%    Weight:      Height:        Intake/Output Summary (Last 24 hours) at 06/03/2024 1324 Last data filed at 06/03/2024 0753 Gross per 24 hour  Intake 110 ml  Output 1400 ml  Net -1290 ml   Filed Weights   06/01/24 1803  Weight: 77.5 kg    Examination:  General exam: Appears calm and comfortable, not in any acute distress, deconditioned. Respiratory system: CTA bilaterally. Respiratory effort normal. RR  16 Cardiovascular system: S1 & S2 heard, RRR. No JVD, murmurs, rubs, gallops or clicks.  Gastrointestinal system: Abdomen is non distended, soft and non tender.  Normal bowel sounds heard. Central nervous system: Alert and oriented X 1. No focal neurological deficits. Extremities: No edema, no cyanosis, no clubbing. Skin: No rashes, lesions or ulcers Psychiatry: Judgement and insight appear normal. Mood & affect appropriate.     Data Reviewed: I have personally reviewed following labs and imaging studies  CBC: Recent Labs  Lab 05/30/24 2209 05/30/24 2220 05/31/24 0409 06/01/24 0336 06/02/24 0605 06/03/24 0534  WBC 9.9  --  11.9* 10.2 9.7 8.9  HGB 10.7* 11.2*  11.6* 11.0* 10.6* 10.4* 10.2*  HCT 35.3* 33.0*  34.0* 35.2* 32.9* 32.6* 32.6*  MCV 97.0  --  95.7 93.5 94.8 95.9  PLT 150  --  139* 149* 143* 137*   Basic Metabolic Panel: Recent Labs  Lab 05/30/24 2209 05/30/24 2220 05/31/24 0409 06/01/24 0336 06/02/24 0605 06/03/24 0534  NA 146* 147*  146* 146* 145 142 144  K 3.7 3.6  3.6 3.7 3.4* 3.3* 3.1*  CL 111 109 113* 109 109 115*  CO2 24  --  21* 25 24 20*  GLUCOSE 92 95 91 102* 104* 82  BUN 39* 37* 36* 22 18 19   CREATININE  1.73* 1.80* 1.44* 1.25* 1.14 1.22  CALCIUM 9.0  --  8.9 8.9 9.0 8.9  MG  --   --  2.4  --   --   --    GFR: Estimated Creatinine Clearance: 43.4 mL/min (by C-G formula based on SCr of 1.22 mg/dL). Liver Function Tests: Recent Labs  Lab 05/30/24 2209  AST 27  ALT 15  ALKPHOS 79  BILITOT 0.5  PROT 7.1  ALBUMIN 3.2*   Recent Labs  Lab 05/30/24 2209  LIPASE 39   No results for input(s): AMMONIA in the last 168 hours. Coagulation Profile: Recent Labs  Lab 05/30/24 2313  INR 1.1   Cardiac Enzymes: No results for input(s): CKTOTAL, CKMB, CKMBINDEX, TROPONINI in the last 168 hours. BNP (last 3 results) No results for input(s): PROBNP in the last 8760 hours. HbA1C: No results for input(s): HGBA1C in the last 72 hours. CBG: No results for input(s): GLUCAP in the  last 168 hours. Lipid Profile: No results for input(s): CHOL, HDL, LDLCALC, TRIG, CHOLHDL, LDLDIRECT in the last 72 hours. Thyroid  Function Tests: No results for input(s): TSH, T4TOTAL, FREET4, T3FREE, THYROIDAB in the last 72 hours. Anemia Panel: No results for input(s): VITAMINB12, FOLATE, FERRITIN, TIBC, IRON, RETICCTPCT in the last 72 hours. Sepsis Labs: Recent Labs  Lab 05/30/24 2220 05/31/24 0025 05/31/24 0409  PROCALCITON  --   --  0.10  LATICACIDVEN 2.0* 1.3  --     Recent Results (from the past 240 hours)  Culture, blood (routine x 2)     Status: None (Preliminary result)   Collection Time: 05/30/24 11:11 PM   Specimen: BLOOD  Result Value Ref Range Status   Specimen Description BLOOD RIGHT ARM  Final   Special Requests   Final    BOTTLES DRAWN AEROBIC ONLY Blood Culture results may not be optimal due to an inadequate volume of blood received in culture bottles   Culture   Final    NO GROWTH 3 DAYS Performed at [facility], [address] [place], [place] [address]    Report Status PENDING  Incomplete  Culture, blood (routine x 2)      Status: None (Preliminary result)   Collection Time: 05/31/24 12:23 AM   Specimen: BLOOD  Result Value Ref Range Status   Specimen Description BLOOD BLOOD RIGHT HAND  Final   Special Requests   Final    BOTTLES DRAWN AEROBIC AND ANAEROBIC Blood Culture adequate volume   Culture   Final    NO GROWTH 3 DAYS Performed at [facility], [address] [place], [place] [address]    Report Status PENDING  Incomplete  Urine Culture     Status: None   Collection Time: 06/02/24  6:00 AM   Specimen: Urine, Clean Catch  Result Value Ref Range Status   Specimen Description   Final    URINE, CLEAN CATCH Performed at [facility], [address] [place], [place] [address]    Special Requests   Final    NONE Performed at [facility], [address] [place], [place] [address]    Culture   Final    NO GROWTH Performed at [facility], [address], [place], [place] [address]    Report Status 06/03/2024 FINAL  Final    Radiology Studies: US  EKG SITE RITE Result Date: 06/03/2024 If Site Rite image not attached, placement could not be confirmed due to current cardiac rhythm.  Scheduled Meds:  heparin  5,000 Units Subcutaneous Q8H   potassium chloride  40 mEq Oral Once   sodium chloride  flush  3 mL Intravenous Q12H   Continuous Infusions:  ampicillin-sulbactam (UNASYN) IV 3 g (06/03/24 0510)   potassium chloride 10 mEq (06/03/24 1252)     LOS: 3 days    Time spent: 35 mins    Darcel Dawley, MD [facility]   If 7PM-7AM, please contact night-coverage

## 2024-06-03 NOTE — Progress Notes (Signed)
 Mobility Specialist - Progress Note   06/03/24 1329  Mobility  Activity Transferred from bed to chair  Level of Assistance Maximum assist, patient does 25-49%  Assistive Device Stedy  Activity Response Tolerated well  Mobility Referral Yes  Mobility visit 1 Mobility  Mobility Specialist Start Time (ACUTE ONLY) 1303  Mobility Specialist Stop Time (ACUTE ONLY) 1328  Mobility Specialist Time Calculation (min) (ACUTE ONLY) 25 min   Pt received in bed and agreeable to transfer to recliner. Pt was MaxA +2 from supine>sitting. Able to pull self up with bar. No complaints during transfer. Pt to recliner after session with all needs met. Chair alarm on.   [facility]

## 2024-06-03 NOTE — Progress Notes (Signed)
 SLP Cancellation Note  Patient Details Name: Rodney Christensen MRN: [id number] DOB: Apr 14, 1941   Cancelled treatment:       Reason Eval/Treat Not Completed: Other (comment) (pt at radiation, has not alternative means of nutrition, pending follow up with Dr Gatha per wife's request re: his swallowing, recommend pt at least be able to consume single ice chips after oral care to decrease disuse muscle atrophy and for oral hygiene, messaged RN and MD with concerns/recommendations.    Will follow up to initiate swallowing exercises if consistent with his GOC.    Madelin POUR, MS [facility] SLP Acute Rehab Services Office [phone]  Rodney Christensen 06/03/2024, 4:12 PM

## 2024-06-03 NOTE — Progress Notes (Signed)
 Arrived to unit to place PICC for TPN.  Hospice has arrived to see patient and states that if Hospice is involved patient cannot get TPN.  Spoke with Angeline, Charity fundraiser.  Angeline will reach out to the doctor to clarify.  Aware that PICC team is leaving campus, per [facility] TPN has not be ordered through pharmacy yet therefore will not get TPN today anyways.

## 2024-06-04 DIAGNOSIS — C320 Malignant neoplasm of glottis: Secondary | ICD-10-CM | POA: Diagnosis not present

## 2024-06-04 LAB — BASIC METABOLIC PANEL WITH GFR
Anion gap: 14 (ref 5–15)
BUN: 23 mg/dL (ref 8–23)
CO2: 22 mmol/L (ref 22–32)
Calcium: 9.3 mg/dL (ref 8.9–10.3)
Chloride: 112 mmol/L — ABNORMAL HIGH (ref 98–111)
Creatinine, Ser: 1.22 mg/dL (ref 0.61–1.24)
GFR, Estimated: 58 mL/min — ABNORMAL LOW (ref >60)
Glucose, Bld: 80 mg/dL (ref 70–99)
Potassium: 3.4 mmol/L — ABNORMAL LOW (ref 3.5–5.1)
Sodium: 148 mmol/L — ABNORMAL HIGH (ref 135–145)

## 2024-06-04 LAB — PHOSPHORUS: Phosphorus: 3.5 mg/dL (ref 2.5–4.6)

## 2024-06-04 LAB — MAGNESIUM: Magnesium: 2.4 mg/dL (ref 1.7–2.4)

## 2024-06-04 MED ORDER — DEXTROSE 5 % IV SOLN: INTRAVENOUS | Status: DC

## 2024-06-04 MED ORDER — POTASSIUM CHLORIDE 20 MEQ PO PACK
40.0000 meq | PACK | Freq: Once | ORAL | Status: AC
  Administered 2024-06-04: 40 meq via ORAL
  Filled 2024-06-04: qty 2

## 2024-06-04 NOTE — Discharge Instructions (Signed)
 Patient is being discharged to [facility] facility.

## 2024-06-04 NOTE — Progress Notes (Signed)
   06/03/24 2300  Provider Notification  Provider Name/Title Lavanda Horns, NP  Date Provider Notified 06/03/24  Time Provider Notified 2304  Method of Notification Page [facility])  Notification Reason Other (Comment) (10 betas run Lore)  Date Critical Result Received 06/03/24  Time Critical Result Received 2254  Provider response No new orders  Date of Provider Response 06/03/24  Time of Provider Response 2308

## 2024-06-04 NOTE — Discharge Summary (Signed)
 Physician Discharge Summary  Rodney Christensen FMW:[id number] DOB: [birth year] DOA: 05/30/2024  PCP: Chrystal Lamarr RAMAN, MD  Admit date: 05/30/2024  Discharge date: 06/04/2024  Admitted From: Home.  Disposition:  [facility]  Recommendations for Outpatient Follow-up:  Follow up with PCP in 1-2 weeks. Please obtain BMP/CBC in one week. Patient is being discharged to [facility] facility.  Home Health:None Equipment/Devices:None  Discharge Condition: Stable CODE STATUS:DNR Diet recommendation: Heart Healthy /  Brief [facility] Course: This 83 yrs old Male with PMH significant for hypertension, BPH, dementia, non-small cell lung cancer status post chemoradiation and left lower lobe lobectomy 2012, CAD status post CABG presents with fatigue, generalized weakness, frequent falls, labored breathing and chest pain. Symptoms started 3 weeks ago after he fell. Patient fell again the night of admission and was having chest discomfort. He was brought by EMS for further evaluation. Patient was noted to be dehydrated, sodium 146, creatinine 1.7, lactic acid 2.0, troponin 80, imaging most concerning for large Left glottic, laryngeal, and hypopharyngeal mass, bulky adenopathy, numerous bilateral pulmonary nodules, right adrenal gland nodule, lytic lesion involving the L4 vertebral body, and left lower lobe consolidation.  Patient was evaluated by Dr. Sherrod who recommended palliative care to discuss goals of care given incurable nature of the cancer and progression. Patient's family has decided [facility] for hospice.  Patient is being discharged to [facility].  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 History of non-small cell lung cancer status post chemoradiation and left lower lobe lobectomy 2012. ENT. Dr Jesus consulted, He discussed care with oncologist Dr. Emery. Dr Sherrod, his primary oncologist had long discussion with his family. Plan to continue NPO for now, failed MBS, wife wants to discuss with Dr Sherrod.  Per Dr Patrcia, radiation oncology, transferred to [facility] for radiation treatment so far has received 5 treatments Dr. Sherrod has long discussion with family about current disease condition and investigations to confirm diagnosis.   He recommended biopsy of the left glottic laryngeal and laryngopharyngeal mass by ENT, given his condition and his staging for head and neck cancer his treatment will be systemic chemotherapy + immunotherapy. Patient would not be able to tolerate this course of treatment .  He recommended palliative care consult about hospice.  Family still wants to pursue biopsy. Family has decided hospice.  Patient is being discharged to [facility].   Community-acquired pneumonia: CT chest : numerous pulmonary nodules, consolidation and bronchiolectatic changes.  He presents with cough, productive. Continued with IV Unasyn .  Care transitioned to full comfort care.   AKI:  He presents with creatinine 1.8. Previous baseline 1.1 Continue with IV fluids. Creatinine down to 1.2 AKI Improved.    Hypernatremia:  Continued D5W water.  Now sodium improved   Hypertension: PRN hydralazine .  Hold lisinopril, Demadex due to AKI.   CAD:  Hold lisinopril, metoprolol, statins while NPO   Dementia:  Resume Namenda  when tolerate oral.    Hypokalemia; replaced.  Continue to monitor     Ventricular tachycardia: Patient has been having nonsustained runs of V. Tach,  Patient remains asymptomatic. Continue to observe  while on telemetry.      Estimated body mass index is 26.78 kg/m as calculated from the following:   Height as of 03/25/24: 5' 7 (1.702 m).   Weight as of 09/18/23: 77.6 kg.  Discharge Instructions  Discharge Instructions     Call MD for:  difficulty breathing, headache or visual disturbances   Complete by: As directed    Call MD for:  persistant dizziness or light-headedness   Complete by: As directed    Call MD for:  persistant nausea and vomiting   Complete by: As directed    Diet - low sodium heart healthy   Complete by: As directed    Diet general   Complete by: As directed    Discharge instructions   Complete by: As directed    Patient is being discharged to [facility] facility.   Increase activity slowly   Complete by: As directed       Allergies as of 06/04/2024       Reactions   Cephalexin Hives   Latanoprost Other (See Comments)   Unknown        Medication List     STOP taking these medications    bimatoprost 0.01 % Soln Commonly known as: LUMIGAN   donepezil  5 MG tablet Commonly known as: ARICEPT    dorzolamide-timolol 2-0.5 % ophthalmic solution Commonly known as: COSOPT   doxazosin 8 MG tablet Commonly known as: CARDURA   folic acid 1 MG tablet Commonly known as: FOLVITE   lisinopril 10 MG tablet Commonly known as: ZESTRIL   memantine  10 MG tablet Commonly known as: NAMENDA    metoprolol succinate 25 MG 24 hr tablet Commonly known as: TOPROL-XL   OCUVITE PO   Potassium Chloride  ER 20 MEQ Tbcr   simvastatin 20 MG tablet Commonly known as: ZOCOR   torsemide 20 MG tablet Commonly known as: DEMADEX        Follow-up Information     Chrystal Lamarr RAMAN, MD Follow up in 1 week(s).   Specialty: Family Medicine Contact information: [address] [place] [address] [phone]                Allergies  Allergen Reactions   Cephalexin Hives   Latanoprost Other (See Comments)    Unknown     Consultations: Oncology   Procedures/Studies: US  EKG SITE RITE Result Date: 06/03/2024 If Site Rite image not attached, placement could not be confirmed due to current cardiac rhythm.  DG Swallowing Func-Speech Pathology Result Date: 06/01/2024 Table formatting from the original result was not included. Modified Barium Swallow Study Patient Details Name: Rodney Christensen MRN: [id number] Date of Birth: 02-Jan-1941 Today's Date: 06/01/2024 HPI/PMH: HPI: 83 year old with past medical history significant for hypertension, BPH, dementia, non-small cell lung cancer status post chemoradiation and left lower lobe lobectomy 2012, CAD status post CABG presents with fatigue, generalized weakness, frequent fall, labored breathing and chest pain.  Symptoms started 3 weeks ago after he fell.  Patient fell again the night of admission and was having chest discomfort.  He was brought by EMS for further evaluation.  Found to have large Left glottic, laryngeal, and hypopharyngeal mass, bulky adenopathy, numerous bilateral pulmonary nodules, right adrenal gland nodule, lytic lesion involving the L4 vertebral body, and left lower lobe consolidation. Clinical Impression: Pt presents with a moderate-severe oropharyngeal dysphagia per results of MBSS completed today.  A safe oral diet cannot be recommended at this time. Oral deficits resulted in posterior spillage  of liquids to the level of the pyriform sinuses or into the laryngeal vestibule before the swallow and inefficient oral transit with min-mod BOT residue observed following the majority of trials. Pharyngeal deficits included reduced hyolaryngeal elevation/excursion, absent epiglottic inversion, reduced velum seal with partial nasal regurgitation of thin liquids, reduced laryngeal vestibule closure and reduced pharyngeal stripping. Findings: -There was aspiration with ejection and suspected x1 instance of aspiration without ejection of thin liquid residue after the swallow.  Pt's shoulder position limited view of airway. -There was shallow and deep penetration with and without ejection of nectar-thick liquids during and after the swallow. Aspiration was not observed but delayed aspiration is expected. -There was shallow, transient penetration of honey-thick liquids and pudding during/after the swallow. Aspiration was not observed but delayed aspiration is expected. -Pt did appear to be sensate to majority or all of penetration/aspiration events with throat clear or cough response. -The amount of pharyngeal residue increased with increased viscosity resulting in large pharyngeal residue post pudding trial. Education: SLP followed up at bedside and discussed MBSS results at length with pt's wife. Pt was present though anticipate poor-no comprehension of this education. We discussed options of comfort diet vs conservative nutrition measures. Pt's wife stated plan to speak with oncologist about pt's prognosis and intervention option. We discussed plan to continue NPO except ice chips today with SLP follow up as goals of care are decided. Pt's wife verbalized good understanding of all education and all questions pertaining to swallowing were answered to her satisfaction today. Plan: SLP will follow up for support and education pending pt's goals of care.  Factors that may increase risk of adverse event in presence of aspiration Noe & Lianne 2021): Factors that may increase risk of adverse event in presence of aspiration Noe & Lianne 2021): Frail or deconditioned; Dependence for feeding and/or oral hygiene; Reduced cognitive function Recommendations/Plan: Swallowing Evaluation Recommendations Swallowing Evaluation Recommendations Recommendations: NPO; Ice chips PRN after oral care Medication Administration: Via alternative means Supervision: Full assist for feeding Postural changes: Stay upright 30-60 min after meals; Position pt fully upright for meals Oral care recommendations:  Oral care QID (4x/day) Recommended consults: Consider Palliative care Treatment Plan Treatment Plan Treatment recommendations: Therapy as outlined in treatment plan below Follow-up recommendations: -- (TBD pending goals of care) Functional status assessment: Patient has had a recent decline in their functional status and/or demonstrates limited ability to make significant improvements in function in a reasonable and predictable amount of time. Treatment frequency: Min 2x/week (pending goals of care) Treatment duration: 2 weeks (pending goals of care) Interventions: Patient/family education (pending goals of care) Recommendations Recommendations for follow up therapy are one component of a multi-disciplinary discharge planning process, led by the attending physician.  Recommendations may be updated based on patient status, additional functional criteria and insurance authorization. Assessment: Orofacial Exam: Orofacial Exam Oral Cavity - Dentition: -- (unable to view) Orofacial Anatomy: WFL Oral Motor/Sensory Function: Unable to test Anatomy: Anatomy: -- (known laryngeal mass) Boluses Administered: Boluses Administered Boluses Administered: Thin liquids (Level 0); Mildly thick liquids (Level 2, nectar thick); Moderately thick liquids (Level 3, honey thick); Puree  Oral Impairment Domain: Oral Impairment Domain Lip Closure: No labial escape Tongue control during bolus hold: Posterior escape of less than half of bolus Bolus preparation/mastication: -- (not assessed) Bolus transport/lingual motion: Delayed initiation of tongue motion (oral holding) Oral residue: Residue collection on oral structures Location of oral residue : Tongue Initiation of pharyngeal swallow : Pyriform sinuses  Pharyngeal Impairment Domain: Pharyngeal  Impairment Domain Soft palate elevation: Escape to nasopharynx Laryngeal elevation: Minimal superior movement of thyroid  cartilage with minimal approximation of arytenoids to epiglottic petiole  Anterior hyoid excursion: Partial anterior movement Epiglottic movement: No inversion Laryngeal vestibule closure: Incomplete, narrow column air/contrast in laryngeal vestibule Pharyngeal stripping wave : Present - diminished Pharyngeal contraction (A/P view only): N/A Pharyngoesophageal segment opening: Complete distension and complete duration, no obstruction of flow Tongue base retraction: Narrow column of contrast or air between tongue base and PPW Pharyngeal residue: Majority of contrast within or on pharyngeal structures Location of pharyngeal residue: Tongue base; Pharyngeal wall; Valleculae; Diffuse (>3 areas)  Esophageal Impairment Domain: Esophageal Impairment Domain Esophageal clearance upright position: -- (not assessed) Pill: Pill Consistency administered: -- (not assessed) Penetration/Aspiration Scale Score: Penetration/Aspiration Scale Score 2.  Material enters airway, remains ABOVE vocal cords then ejected out: Mildly thick liquids (Level 2, nectar thick); Puree; Moderately thick liquids (Level 3, honey thick) 3.  Material enters airway, remains ABOVE vocal cords and not ejected out: Thin liquids (Level 0); Mildly thick liquids (Level 2, nectar thick) 4.  Material enters airway, CONTACTS cords then ejected out: Thin liquids (Level 0); Mildly thick liquids (Level 2, nectar thick) 5.  Material enters airway, CONTACTS cords and not ejected out: Thin liquids (Level 0) 6.  Material enters airway, passes BELOW cords then ejected out: Thin liquids (Level 0) 7.  Material enters airway, passes BELOW cords and not ejected out despite cough attempt by patient: Thin liquids (Level 0) Compensatory Strategies: Compensatory Strategies Compensatory strategies: Yes Straw: Ineffective Ineffective Straw: Thin liquid (Level 0); Mildly thick liquid (Level 2, nectar thick) Chin tuck: Ineffective Ineffective Chin Tuck: Thin liquid (Level 0) Liquid wash: Effective Effective Liquid Wash: Mildly thick liquid (Level 2, nectar  thick) Left head turn: Ineffective Ineffective Left Head Turn: Mildly thick liquid (Level 2, nectar thick) Right head turn: Ineffective Ineffective Right Head Turn: Mildly thick liquid (Level 2, nectar thick)   General Information: Caregiver present: Yes  Diet Prior to this Study: NPO   Temperature : Normal   Respiratory Status: WFL   Supplemental O2: None (Room air)   History of Recent Intubation: No  Behavior/Cognition: Alert; Requires cueing; Confused No data recorded Baseline vocal quality/speech: -- (intermittent wet voicing noted) Volitional Cough: -- (inconsistent) Volitional Swallow: -- (inconsistent) Exam Limitations: Limited visibility (due to shoulder position) Goal Planning: Prognosis for improved oropharyngeal function: Guarded Barriers to Reach Goals: Cognitive deficits; Severity of deficits; Overall medical prognosis No data recorded Patient/Family Stated Goal: none stated Consulted and agree with results and recommendations: Patient; Nurse; Physician; Family member/caregiver Pain: No data recorded End of Session: Start Time:SLP Start Time (ACUTE ONLY): 0930 Stop Time: SLP Stop Time (ACUTE ONLY): 1000 Start Time:SLP Start Time (ACUTE ONLY): 1009 Stop Time: SLP Stop Time (ACUTE ONLY): 1038 Time Calculation:SLP Time Calculation (min) (ACUTE ONLY): 30 min Time Calculation:SLP Time Calculation (min) (ACUTE ONLY): 29 min Charges: SLP Evaluations $ SLP Speech Visit: 1 Visit SLP Evaluations $MBS Swallow: 1 Procedure $Swallowing Treatment: 1 Procedure SLP visit diagnosis: SLP Visit Diagnosis: Dysphagia, oropharyngeal phase (R13.12) Past Medical History: Past Medical History: Diagnosis Date  BPH (benign prostatic hyperplasia)   CAD, NATIVE VESSEL 03/22/2010  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  Diverticulosis   History of radiation therapy 03/18/11 to 04/19/11  lower left lung  HYPERLIPIDEMIA-MIXED 03/17/2009  HYPERTENSION, UNSPECIFIED 03/17/2009  Macular degeneration   Skin cancer   melanoma-nose  Squamous cell  carcinoma lung (HCC)   Left lower lobe  Past Surgical History: Past  Surgical History: Procedure Laterality Date  BRONCHOSCOPY  06/11/11  [facility]  CAROTID ENDARTERECTOMY    CORONARY ARTERY BYPASS GRAFT  10/10/2004  x5  Lt VATS,Lt thoacotomy,Lt lower lobectomy with  mediastinal node  dissection  06/11/11  [facility] Allyson JINNY Rummer 06/01/2024, 11:33 AM  CT Soft Tissue Neck W Contrast Result Date: 05/31/2024 CLINICAL DATA:  abnormal non con neck CT. EXAM: CT NECK WITH CONTRAST TECHNIQUE: Multidetector CT imaging of the neck was performed using the standard protocol following the bolus administration of intravenous contrast. RADIATION DOSE REDUCTION: This exam was performed according to the departmental dose-optimization program which includes automated exposure control, adjustment of the mA and/or kV according to patient size and/or use of iterative reconstruction technique. CONTRAST:  75mL OMNIPAQUE  IOHEXOL  350 MG/ML SOLN COMPARISON:  None Available. FINDINGS: Pharynx and larynx: Bulky/large 4.1 x 3.5 x 3.7 cm glottic laryngeal and hypopharyngeal mass with both supraglottic and subglottic extension clips. The mass abuts the arytenoid cartilage and extends anteriorly to involve the left true cord with extension through and erosion of the he invasion of the paraglottic fat and invasion through the thyroid  cartilage. Salivary glands: Absent left submandibular gland. Right submandibular gland and parotid glands are within normal limits. Clips in the left submandibular region and left neck. Thyroid : Subcentimeter thyroid  nodules which do not require further imaging follow-up (ref: J Am Coll Radiol. 2015 Feb;12(2): 143-50). Lymph nodes: Enlarged, bulky 2.9 cm left level II Clips in the left neck. Multiple additional surrounding enlarged left level II and rounded level III lymph nodes. Vascular: Severe stenosis of the left internal carotid artery proximally, not well characterized on this study. Extensive  atherosclerosis. Limited intracranial: Negative. Visualized orbits: Negative. Mastoids and visualized paranasal sinuses: Multiple retention cysts. No mastoid effusions. Skeleton: Further evaluated on same day CT of the cervical spine. Upper chest: Evaluated on same day CT of the chest/abdomen/pelvis. IMPRESSION: 1. Large 4.1 cm left glottic laryngeal and hypopharyngeal mass with both supraglottic and subglottic extension, involvement of the left true cord and paraglottic fat, and invasion through the left thyroid  cartilage. Recommend ENT consultation. 2. Bulky enlarged 2.9 cm left level II, compatible with nodal metastasis. Multiple additional surrounding enlarged left level II and rounded level III lymph nodes, suspicious for additional metastases. A PET-CT could provide more definitive nodal staging if clinically warranted. 3. Severe stenosis of the left internal carotid artery proximally, not well characterized on this study. A CTA of the neck could further evaluate/quantify. Preliminary findings discussed with Dr. Haze via telephone at 1:22 a.m. Electronically Signed   By: Gilmore GORMAN Molt M.D.   On: 05/31/2024 01:26   CT CHEST ABDOMEN PELVIS W CONTRAST Result Date: 05/31/2024 CLINICAL DATA:  Sepsis. Suspicious pulmonary nodule seen on CT cervical spine earlier today EXAM: CT CHEST, ABDOMEN, AND PELVIS WITH CONTRAST TECHNIQUE: Multidetector CT imaging of the chest, abdomen and pelvis was performed following the standard protocol during bolus administration of intravenous contrast. RADIATION DOSE REDUCTION: This exam was performed according to the departmental dose-optimization program which includes automated exposure control, adjustment of the mA and/or kV according to patient size and/or use of iterative reconstruction technique. CONTRAST:  75mL OMNIPAQUE  IOHEXOL  350 MG/ML SOLN COMPARISON:  CT cervical spine earlier today; CT chest 06/20/2020; PET/CT 02/18/2011 FINDINGS: CT CHEST FINDINGS  Cardiovascular: Coronary artery and aortic atherosclerotic calcification. No pericardial effusion. Normal caliber thoracic aorta. CABG. Mediastinum/Nodes: Wispy secretions in the trachea. Small hiatal hernia. No thoracic adenopathy. Left-sided soft tissue nodule posterior to the hyoid bone. See dedicated CT neck  for details. Lungs/Pleura: Numerous bilateral pulmonary nodules suspicious for metastatic disease. The largest on the right is in the right lower lobe and measures 2.2 x 2.3 cm (series 5/image 104) the largest in the left is in the left upper lobe and measures 2.1 x 1.3 cm (5/74). Unchanged fullness in the left infrahilar region due to postradiation changes. Consolidation and bronchiectatic changes in the treated left lung following left lower lobectomy. Small left-sided pleural effusion. Small left pleural effusion. No pneumothorax. Musculoskeletal: No acute fracture or destructive osseous lesion. Sternotomy. CT ABDOMEN PELVIS FINDINGS Hepatobiliary: Cholelithiasis. No evidence of acute cholecystitis. No biliary dilation. No focal hepatic lesion. Pancreas: Unremarkable. Spleen: Unremarkable. Adrenals/Urinary Tract: New 1.7 x 1.3 cm nodule in the right adrenal gland concerning for metastasis. Unremarkable left adrenal gland. Cortical renal scarring both kidneys. No urinary calculi or hydronephrosis. Unremarkable bladder. Stomach/Bowel: Normal caliber large and small bowel. No bowel wall thickening. Small hiatal hernia. Stomach is otherwise within normal limits. Vascular/Lymphatic: Advanced aortic atherosclerotic calcification. 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 No lymphadenopathy. Reproductive: Enlarged prostate indenting the floor of the bladder. Other: No free intraperitoneal fluid or air. Musculoskeletal: No acute fracture. Lytic lesion with surrounding sclerosis and cortical breakthrough and extraosseous soft tissue component in the L4 vertebral body consistent with metastasis. This measures  2.2 x 2.0 cm. IMPRESSION: 1. Numerous bilateral pulmonary nodules consistent with metastatic disease. 2. New 1.7 cm nodule in the right adrenal gland concerning for metastasis. 3. Lytic lesion with surrounding sclerosis and cortical breakthrough and extraosseous soft tissue component in the L4 vertebral body consistent with metastasis. 4. Chronic post surgical and radiation changes to the left lower lung. Small left pleural effusion. 5. Soft tissue nodule in the left neck at the level of the hyoid bone suspicious for primary malignancy or metastasis. See separate report of CT neck for details. 6. Cholelithiasis. 7. 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 8. Aortic Atherosclerosis (ICD10-I70.0). Electronically Signed   By: Norman Gatlin M.D.   On: 05/31/2024 00:29   CT Cervical Spine Wo Contrast Result Date: 05/30/2024 CLINICAL DATA:  Neck trauma (Age >= 65y) EXAM: CT CERVICAL SPINE WITHOUT CONTRAST TECHNIQUE: Multidetector CT imaging of the cervical spine was performed without intravenous contrast. Multiplanar CT image reconstructions were also generated. RADIATION DOSE REDUCTION: This exam was performed according to the departmental dose-optimization program which includes automated exposure control, adjustment of the mA and/or kV according to patient size and/or use of iterative reconstruction technique. COMPARISON:  None Available. FINDINGS: Alignment: Normal. Skull base and vertebrae: No acute fracture. Vertebral body heights are maintained. The dens and skull base are intact. Soft tissues and spinal canal: No prevertebral fluid or swelling. No visible canal hematoma. Disc levels: Multilevel degenerative change throughout the cervical spine. Disc space narrowing and spurring at multiple levels with facet hypertrophy. Upper chest: Postsurgical change at the carotid bulb with asymmetric rounded soft tissue density, series 5, image 54. Abnormal soft tissue density in the left neck just posterior to the  hyoid bone causing mass effect on the airway, series 5, image 78. There are multiple apical pulmonary nodules, for example in the left upper lobe measuring 6 mm series 4, image 99. Right upper lobe only partially included in the field of view 8 mm series 4, image 102. Nodules are new from 2021 chest CT. Other: Right carotid calcifications. IMPRESSION: 1. No acute fracture or subluxation of the cervical spine. 2. Multilevel degenerative change. 3. Postsurgical change at the carotid bulb with asymmetric rounded  soft tissue density. Abnormal soft tissue density in the left neck just posterior to the hyoid bone causing mass effect on the airway. Findings may represent adenopathy. Recommend contrast-enhanced neck CT for further assessment. 4. Multiple apical pulmonary nodules, new from 2021 chest CT, suspicious for metastatic disease. Chest CT recommended for complete assessment. Electronically Signed   By: Andrea Gasman M.D.   On: 05/30/2024 23:13   DG Chest Port 1 View Result Date: 05/30/2024 CLINICAL DATA:  Chest pain EXAM: PORTABLE CHEST 1 VIEW COMPARISON:  05/30/2024 FINDINGS: Stable small laterally loculated left pleural effusion. Lung volumes are small and pulmonary insufflation has diminished since prior examination. Resultant vascular crowding at the hila. No confluent pulmonary infiltrate. No pneumothorax. No pleural effusion on the right. Coronary artery bypass grafting has been performed. Cardiac size within normal limits. No acute bone abnormality IMPRESSION: 1. Pulmonary hypoinflation. 2. Stable small laterally loculated left pleural effusion. Electronically Signed   By: Dorethia Molt M.D.   On: 05/30/2024 23:11   CT Head Wo Contrast Result Date: 05/30/2024 CLINICAL DATA:  Head trauma EXAM: CT HEAD WITHOUT CONTRAST TECHNIQUE: Contiguous axial images were obtained from the base of the skull through the vertex without intravenous contrast. RADIATION DOSE REDUCTION: This exam was performed according  to the departmental dose-optimization program which includes automated exposure control, adjustment of the mA and/or kV according to patient size and/or use of iterative reconstruction technique. COMPARISON:  MRI head 09/01/2021 and CT head 04/27/2006 FINDINGS: Brain: No intracranial hemorrhage, mass effect, or evidence of acute infarct. No hydrocephalus. No extra-axial fluid collection. Generalized cerebral atrophy and chronic small vessel ischemic disease. Vascular: No hyperdense vessel. Intracranial arterial calcification. Skull: No fracture or focal lesion. Sinuses/Orbits: No acute finding. Other: None. IMPRESSION: No acute intracranial abnormality. Electronically Signed   By: Norman Gatlin M.D.   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 Presents with chest pain. Fell yesterday but did not seek medical attention. EXAM: CHEST - 2 VIEW COMPARISON:  Chest CT with contrast 06/20/2020 FINDINGS: The lungs are mildly emphysematous. Some volume loss again noted on the left with left lower lobectomy. There are post treatment changes in the dorsal left mid lung best seen on the lateral. There is increased opacity is suspected in the left base concerning for pneumonia or aspiration. The remaining lungs are clear. No substantial pleural effusion is seen. Stable mild cardiomegaly with CABG changes. Central vessel markings are normal caliber. Stable mediastinum with aortic atherosclerosis. Osteopenia and bridging enthesopathy thoracic spine with no new osseous findings. IMPRESSION: 1. Increased opacity in the left base concerning for pneumonia or aspiration. 2. Emphysema, left lower lobectomy and post treatment changes 3. Stable mild cardiomegaly with CABG changes. 4. Aortic atherosclerosis. 5. Osteopenia and bridging enthesopathy thoracic spine. Electronically Signed   By: Francis Quam M.D.   On:  05/30/2024 22:11     Subjective: Patient was seen and examined at bedside. Family has decided comfort care.  Patient being discharged to [facility] facility  Discharge Exam: Vitals:   06/04/24 0656 06/04/24 1348  BP: (!) (P) 154/74 (!) 179/80  Pulse: (P) 88 87  Resp:  19  Temp:  98.5 F (36.9 C)  SpO2:  96%   Vitals:   06/03/24 2301 06/04/24 0550 06/04/24 0656 06/04/24 1348  BP: (!) 144/65 (!) 184/79 (!) (P) 154/74 (!) 179/80  Pulse: 91 86 (P) 88 87  Resp:  20  19  Temp: 97.9 F (36.6 C) 98.5 F (36.9 C)  98.5 F (36.9 C)  TempSrc: Oral Axillary  Oral  SpO2: 95% 95%  96%  Weight:      Height:        General: Pt is comfortable , calm, deconditioned,  not in any distress. Cardiovascular: RRR, S1/S2 +, no rubs, no gallops Respiratory: CTA bilaterally, no wheezing, no rhonchi Abdominal: Soft, NT, ND, bowel sounds + Extremities: no edema, no cyanosis    The results of significant diagnostics from this hospitalization (including imaging, microbiology, ancillary and laboratory) are listed below for reference.     Microbiology: Recent Results (from the past 240 hours)  Culture, blood (routine x 2)     Status: None (Preliminary result)   Collection Time: 05/30/24 11:11 PM   Specimen: BLOOD  Result Value Ref Range Status   Specimen Description BLOOD RIGHT ARM  Final   Special Requests   Final    BOTTLES DRAWN AEROBIC ONLY Blood Culture results may not be optimal due to an inadequate volume of blood received in culture bottles   Culture   Final    NO GROWTH 4 DAYS Performed at [facility], [address], [place], [place] [address]    Report Status PENDING  Incomplete  Culture, blood (routine x 2)     Status: None (Preliminary result)   Collection Time: 05/31/24 12:23 AM   Specimen: BLOOD  Result Value Ref Range Status   Specimen Description BLOOD BLOOD RIGHT HAND  Final   Special Requests   Final    BOTTLES DRAWN AEROBIC AND ANAEROBIC Blood  Culture adequate volume   Culture   Final    NO GROWTH 4 DAYS Performed at [facility], [address] [place], [place] [address]    Report Status PENDING  Incomplete  Urine Culture     Status: None   Collection Time: 06/02/24  6:00 AM   Specimen: Urine, Clean Catch  Result Value Ref Range Status   Specimen Description   Final    URINE, CLEAN CATCH Performed at [facility], [address] [place], [place] [address]    Special Requests   Final    NONE Performed at [facility], [address] [place], [place] [address]    Culture   Final    NO GROWTH Performed at [facility], [address], [place], [place] [address]    Report Status 06/03/2024 FINAL  Final     Labs: BNP (last 3 results) No results for input(s): BNP in the last 8760 hours. Basic Metabolic Panel: Recent Labs  Lab 05/31/24 0409 06/01/24 0336 06/02/24 0605 06/03/24 0534 06/04/24 0540  NA 146* 145 142 144 148*  K 3.7 3.4* 3.3* 3.1* 3.4*  CL 113* 109 109 115* 112*  CO2 21* 25 24 20* 22  GLUCOSE 91 102* 104* 82 80  BUN 36* 22 18 19 23   CREATININE 1.44* 1.25* 1.14 1.22 1.22  CALCIUM 8.9 8.9 9.0 8.9 9.3  MG 2.4  --   --   --  2.4  PHOS  --   --   --   --  3.5   Liver Function Tests: Recent Labs  Lab 05/30/24 2209  AST 27  ALT 15  ALKPHOS 79  BILITOT 0.5  PROT 7.1  ALBUMIN 3.2*   Recent Labs  Lab 05/30/24 2209  LIPASE 39   No results for  input(s): AMMONIA in the last 168 hours. CBC: Recent Labs  Lab 05/30/24 2209 05/30/24 2220 05/31/24 0409 06/01/24 0336 06/02/24 0605 06/03/24 0534  WBC 9.9  --  11.9* 10.2 9.7 8.9  HGB 10.7* 11.2*  11.6* 11.0* 10.6* 10.4* 10.2*  HCT 35.3* 33.0*  34.0* 35.2* 32.9* 32.6* 32.6*  MCV 97.0  --  95.7 93.5 94.8 95.9  PLT 150  --  139* 149* 143* 137*   Cardiac Enzymes: No results for input(s): CKTOTAL, CKMB, CKMBINDEX, TROPONINI in the last 168 hours. BNP: Invalid input(s):  POCBNP CBG: No results for input(s): GLUCAP in the last 168 hours. D-Dimer No results for input(s): DDIMER in the last 72 hours. Hgb A1c No results for input(s): HGBA1C in the last 72 hours. Lipid Profile No results for input(s): CHOL, HDL, LDLCALC, TRIG, CHOLHDL, LDLDIRECT in the last 72 hours. Thyroid  function studies No results for input(s): TSH, T4TOTAL, T3FREE, THYROIDAB in the last 72 hours.  Invalid input(s): FREET3 Anemia work up No results for input(s): VITAMINB12, FOLATE, FERRITIN, TIBC, IRON, RETICCTPCT in the last 72 hours. Urinalysis    Component Value Date/Time   COLORURINE YELLOW 05/30/2024 2216   APPEARANCEUR CLEAR 05/30/2024 2216   LABSPEC 1.012 05/30/2024 2216   PHURINE 5.0 05/30/2024 2216   GLUCOSEU NEGATIVE 05/30/2024 2216   HGBUR NEGATIVE 05/30/2024 2216   BILIRUBINUR NEGATIVE 05/30/2024 2216   KETONESUR NEGATIVE 05/30/2024 2216   PROTEINUR NEGATIVE 05/30/2024 2216   UROBILINOGEN 0.2 06/07/2011 1542   NITRITE NEGATIVE 05/30/2024 2216   LEUKOCYTESUR NEGATIVE 05/30/2024 2216   Sepsis Labs Recent Labs  Lab 05/31/24 0409 06/01/24 0336 06/02/24 0605 06/03/24 0534  WBC 11.9* 10.2 9.7 8.9   Microbiology Recent Results (from the past 240 hours)  Culture, blood (routine x 2)     Status: None (Preliminary result)   Collection Time: 05/30/24 11:11 PM   Specimen: BLOOD  Result Value Ref Range Status   Specimen Description BLOOD RIGHT ARM  Final   Special Requests   Final    BOTTLES DRAWN AEROBIC ONLY Blood Culture results may not be optimal due to an inadequate volume of blood received in culture bottles   Culture   Final    NO GROWTH 4 DAYS Performed at [facility], [address] [place], [place] [address]    Report Status PENDING  Incomplete  Culture, blood (routine x 2)     Status: None (Preliminary result)   Collection Time: 05/31/24 12:23 AM   Specimen: BLOOD  Result Value Ref Range Status    Specimen Description BLOOD BLOOD RIGHT HAND  Final   Special Requests   Final    BOTTLES DRAWN AEROBIC AND ANAEROBIC Blood Culture adequate volume   Culture   Final    NO GROWTH 4 DAYS Performed at [facility], [address], [place], [place] [address]    Report Status PENDING  Incomplete  Urine Culture     Status: None   Collection Time: 06/02/24  6:00 AM   Specimen: Urine, Clean Catch  Result Value Ref Range Status   Specimen Description   Final    URINE, CLEAN CATCH Performed at [facility], [address] [place], [place] [address]    Special Requests   Final    NONE Performed at [facility], [address] [place], [place] [address]    Culture   Final    NO GROWTH Performed at [facility], [address], [place],  [place] [address]    Report Status 06/03/2024 FINAL  Final     Time coordinating discharge: Over 30 minutes  SIGNED:   Darcel Dawley, MD  [facility] 06/04/2024, 2:25 PM Pager   If 7PM-7AM, please contact night-coverage

## 2024-06-04 NOTE — Progress Notes (Signed)
Report called to [facility].  

## 2024-06-04 NOTE — TOC Transition Note (Signed)
 Transition of Care [facility]) - Discharge Note   Patient Details  Name: Rodney Christensen MRN: [id number] Date of Birth: 1941/10/12  Transition of [facility]) CM/SW Contact:  Toy LITTIE Agar, RN Phone [phone]  06/04/2024, 2:59 PM   Clinical Narrative:    Patient discharging to [facility]. Transportation has been arranged per PTAR.  Discharge packet in chart at nurses station. Wife at bedside updated. No other TOC needs. TOC will sign off.    Final next level of care: Hospice Medical Facility Barriers to Discharge: No Barriers Identified   Patient Goals and CMS Choice Patient states their goals for this hospitalization and ongoing recovery are:: Patient unable to answer. Wife states plan is for residential hospice. CMS Medicare.gov Compare Post Acute Care list provided to:: Patient Represenative (must comment) (Wife) Choice offered to / list presented to : Spouse [place] ownership interest in [facility]provided to::  (n/a)    Discharge Placement                Patient to be transferred to facility by: PTAR Name of family member notified: Wife at bedside Patient and family notified of of transfer: 06/04/24  Discharge Plan and Services Additional resources added to the After Visit Summary for   In-house Referral: NA Discharge Planning Services: NA Post Acute Care Choice: Hospice                    HH Arranged: NA HH Agency: NA        Social Drivers of Health (SDOH) Interventions SDOH Screenings   Food Insecurity: No Food Insecurity (05/31/2024)  Housing: Low Risk  (05/31/2024)  Transportation Needs: No Transportation Needs (05/31/2024)  Utilities: Not At Risk (05/31/2024)  Social Connections: Socially Isolated (05/31/2024)  Tobacco Use: Medium Risk (05/31/2024)     Readmission Risk Interventions     No data to display

## 2024-06-04 NOTE — Progress Notes (Signed)
 Speech Language Pathology Discharge Patient Details Name: Rodney Christensen MRN: [id number] DOB: [birth year] Today's Date: 06/04/2024    Patient discharged from SLP services secondary to pt discharge plan is inpatient hospice.  Please see latest therapy progress note for current level of functioning and progress toward goals.  No goals set pending plan of care.   Progress and discharge plan discussed with patient and/or caregiver: n/a Informed RN will sign off.    Madelin POUR, MS [facility] SLP Acute Rehab Services Office [phone]    [name] 06/04/2024, 2:45 PM

## 2024-06-04 NOTE — Progress Notes (Signed)
 Pt had 5 bts run of SVT @ 0255 and 10 bts run VT at 2254. Pt asymptomatic. Provider notified. See vitals and new orders. Will continue with plan of care.

## 2024-06-04 NOTE — Progress Notes (Signed)
 PROGRESS NOTE    Rodney Christensen  FMW:[id number] DOB: 1941-06-05 DOA: 05/30/2024 PCP: Chrystal Lamarr RAMAN, MD    Brief Narrative:  This 83 yrs old Male with PMH significant for hypertension, BPH, dementia, non-small cell lung cancer status post chemoradiation and left lower lobe lobectomy 2012, CAD status post CABG presents with fatigue, generalized weakness, frequent falls, labored breathing and chest pain.  Symptoms started 3 weeks ago after he fell.  Patient fell again the night of admission and was having chest discomfort.  He was brought by EMS for further evaluation. Patient was noted to be dehydrated,  sodium 146, creatinine 1.7,  lactic acid 2.0,  troponin 80, imaging most concerning for large Left glottic, laryngeal, and hypopharyngeal mass, bulky adenopathy, numerous bilateral pulmonary nodules, right adrenal gland nodule, lytic lesion involving the L4 vertebral body, and left lower lobe consolidation.  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 History of non-small cell lung cancer status post chemoradiation and left lower lobe lobectomy 2012. ENT. Dr Jesus consulted, He discussed care with oncologist Dr. Emery. Dr Sherrod, his primary oncologist had long discussion with his family. Plan to continue NPO for now, failed MBS, wife wants to discuss with Dr Sherrod.  Per Dr Patrcia, radiation oncology, transferred to [facility] for radiation treatment so far has received 5 treatments Dr. Sherrod has long discussion with family about current disease condition and investigations to confirm diagnosis.   He recommended  biopsy of the left glottic laryngeal and laryngopharyngeal mass by ENT, given his condition and his staging for head and neck cancer his treatment will be systemic chemotherapy + immunotherapy. Patient would not be able to tolerate this course of treatment .  He recommended palliative care consult about hospice.  Family still wants to pursue biopsy.   Community-acquired pneumonia: CT chest : numerous pulmonary nodules, consolidation and bronchiolectatic changes.  He presents with cough, productive. Continue with IV Unasyn .    AKI:  He presents with creatinine 1.8. Previous baseline 1.1 Continue with IV fluids. Creatinine down to 1.2 AKI Improved.    Hypernatremia:  Continued D5W water.  Now sodium improved   Hypertension: PRN hydralazine .  Hold lisinopril, Demadex due to AKI.   CAD:  Hold lisinopril, metoprolol, statins while NPO   Dementia:  Resume Namenda  when tolerate oral.    Hypokalemia; replaced.  Continue to monitor    Ventricular tachycardia: Patient has been having nonsustained runs of V. Tach,  Patient remains asymptomatic. Continue to observe while on telemetry.    Estimated body mass index is 26.78 kg/m as calculated from the following:   Height as of 03/25/24: 5' 7 (1.702 m).   Weight as of 09/18/23: 77.6 kg.  DVT prophylaxis: Heparin  Code Status: Full code Family Communication: No family at bed side. Disposition Plan:   Status is: Inpatient Remains inpatient appropriate because: Severity of illness.  Oncologist recommended palliative care consultation to discuss goals of care.   Consultants:  Oncology Radiation therapy  Procedures:  Antimicrobials: Anti-infectives (From admission, onward)    Start     Dose/Rate Route Frequency Ordered Stop   05/31/24 0600  Ampicillin -Sulbactam (UNASYN ) 3  g in sodium chloride  0.9 % 100 mL IVPB        3 g 200 mL/hr over 30 Minutes Intravenous Every 8 hours 05/31/24 0311     05/30/24 2300  Ampicillin -Sulbactam  (UNASYN ) 3 g in sodium chloride  0.9 % 100 mL IVPB        3 g 200 mL/hr over 30 Minutes Intravenous  Once 05/30/24 2249 05/31/24 0005      Subjective: Patient was seen and examined at bedside.  Overnight events noted. Patient appears at his baseline.  Patient is very deconditioned, has had fluctuating mental status. Wife at bedside, wants to wait on TPN and PICC line.  Objective: Vitals:   06/03/24 2058 06/03/24 2301 06/04/24 0550 06/04/24 0656  BP: (!) 176/102 (!) 144/65 (!) 184/79 (!) (P) 154/74  Pulse: 90 91 86 (P) 88  Resp: 20  20   Temp: 98.3 F (36.8 C) 97.9 F (36.6 C) 98.5 F (36.9 C)   TempSrc: Oral Oral Axillary   SpO2: 96% 95% 95%   Weight:      Height:        Intake/Output Summary (Last 24 hours) at 06/04/2024 1052 Last data filed at 06/04/2024 0330 Gross per 24 hour  Intake 720.11 ml  Output 250 ml  Net 470.11 ml   Filed Weights   06/01/24 1803  Weight: 77.5 kg    Examination:  General exam: Appears calm and comfortable, not in any acute distress, deconditioned. Respiratory system: CTA bilaterally. Respiratory effort normal. RR 16 Cardiovascular system: S1 & S2 heard, RRR. No JVD, murmurs, rubs, gallops or clicks.  Gastrointestinal system: Abdomen is non distended, soft and non tender.  Normal bowel sounds heard. Central nervous system: Alert and oriented X 1. No focal neurological deficits. Extremities: No edema, no cyanosis, no clubbing. Skin: No rashes, lesions or ulcers Psychiatry: Mood & affect appropriate.     Data Reviewed: I have personally reviewed following labs and imaging studies  CBC: Recent Labs  Lab 05/30/24 2209 05/30/24 2220 05/31/24 0409 06/01/24 0336 06/02/24 0605 06/03/24 0534  WBC 9.9  --  11.9* 10.2 9.7 8.9  HGB 10.7* 11.2*  11.6* 11.0* 10.6* 10.4* 10.2*  HCT 35.3* 33.0*  34.0* 35.2* 32.9* 32.6* 32.6*  MCV 97.0  --  95.7 93.5 94.8 95.9  PLT 150  --  139* 149* 143* 137*   Basic Metabolic Panel: Recent Labs  Lab  05/31/24 0409 06/01/24 0336 06/02/24 0605 06/03/24 0534 06/04/24 0540  NA 146* 145 142 144 148*  K 3.7 3.4* 3.3* 3.1* 3.4*  CL 113* 109 109 115* 112*  CO2 21* 25 24 20* 22  GLUCOSE 91 102* 104* 82 80  BUN 36* 22 18 19 23   CREATININE 1.44* 1.25* 1.14 1.22 1.22  CALCIUM 8.9 8.9 9.0 8.9 9.3  MG 2.4  --   --   --  2.4  PHOS  --   --   --   --  3.5   GFR: Estimated Creatinine Clearance: 43.4 mL/min (by C-G formula based on SCr of 1.22 mg/dL). Liver Function Tests: Recent Labs  Lab 05/30/24 2209  AST 27  ALT 15  ALKPHOS 79  BILITOT 0.5  PROT 7.1  ALBUMIN 3.2*   Recent Labs  Lab 05/30/24 2209  LIPASE 39   No results for input(s): AMMONIA in the last 168 hours. Coagulation Profile: Recent Labs  Lab 05/30/24 2313  INR 1.1   Cardiac Enzymes: No results for input(s): CKTOTAL, CKMB, CKMBINDEX, TROPONINI in the last  168 hours. BNP (last 3 results) No results for input(s): PROBNP in the last 8760 hours. HbA1C: No results for input(s): HGBA1C in the last 72 hours. CBG: No results for input(s): GLUCAP in the last 168 hours. Lipid Profile: No results for input(s): CHOL, HDL, LDLCALC, TRIG, CHOLHDL, LDLDIRECT in the last 72 hours. Thyroid  Function Tests: No results for input(s): TSH, T4TOTAL, FREET4, T3FREE, THYROIDAB in the last 72 hours. Anemia Panel: No results for input(s): VITAMINB12, FOLATE, FERRITIN, TIBC, IRON, RETICCTPCT in the last 72 hours. Sepsis Labs: Recent Labs  Lab 05/30/24 2220 05/31/24 0025 05/31/24 0409  PROCALCITON  --   --  0.10  LATICACIDVEN 2.0* 1.3  --     Recent Results (from the past 240 hours)  Culture, blood (routine x 2)     Status: None (Preliminary result)   Collection Time: 05/30/24 11:11 PM   Specimen: BLOOD  Result Value Ref Range Status   Specimen Description BLOOD RIGHT ARM  Final   Special Requests   Final    BOTTLES DRAWN AEROBIC ONLY Blood Culture results may not be optimal  due to an inadequate volume of blood received in culture bottles   Culture   Final    NO GROWTH 4 DAYS Performed at [facility], [address], [place], [place] [address]    Report Status PENDING  Incomplete  Culture, blood (routine x 2)     Status: None (Preliminary result)   Collection Time: 05/31/24 12:23 AM   Specimen: BLOOD  Result Value Ref Range Status   Specimen Description BLOOD BLOOD RIGHT HAND  Final   Special Requests   Final    BOTTLES DRAWN AEROBIC AND ANAEROBIC Blood Culture adequate volume   Culture   Final    NO GROWTH 4 DAYS Performed at [facility], [address], [place], [place] [address]    Report Status PENDING  Incomplete  Urine Culture     Status: None   Collection Time: 06/02/24  6:00 AM   Specimen: Urine, Clean Catch  Result Value Ref Range Status   Specimen Description   Final    URINE, CLEAN CATCH Performed at [facility], [address], [place], [place] [address]    Special Requests   Final    NONE Performed at [facility], [address] [place], [place] [address]    Culture   Final    NO GROWTH Performed at [facility], [address], [place], [place] [address]    Report Status 06/03/2024 FINAL  Final    Radiology Studies: US  EKG SITE RITE Result Date: 06/03/2024 If Site Rite image not attached, placement could not be confirmed due to current cardiac rhythm.  Scheduled Meds:  heparin   5,000 Units Subcutaneous Q8H   potassium chloride   40 mEq Oral Once   sodium chloride  flush  3 mL Intravenous Q12H   Continuous Infusions:  ampicillin -sulbactam (UNASYN ) IV 3 g (06/04/24 0522)     LOS: 4 days    Time spent: 50 mins    Darcel Dawley, MD [facility]   If 7PM-7AM, please contact night-coverage

## 2024-06-05 LAB — CULTURE, BLOOD (ROUTINE X 2)
Culture: NO GROWTH
Culture: NO GROWTH
Special Requests: ADEQUATE

## 2024-06-07 NOTE — Radiation Completion Notes (Signed)
 Patient Name: Rodney Christensen, Rodney Christensen MRN: [id number] Date of Birth: September 06, 1941 Referring Physician: IDA LOADER, M.D. Date of Service: 2024-06-07 Radiation Oncologist: Adina Barge, M.D. 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 T=cT4a, N=cN2b, M=cM1 Intent: Palliative     ==========DELIVERED PLANS==========  First Treatment Date: 2024-06-02 Last Treatment Date: 2024-06-03   Plan Name: HN Site: Larynx Technique: IMRT Mode: Photon Dose Per Fraction: 3.7 Gy Prescribed Dose (Delivered / Prescribed): 14.8 Gy / 14.8 Gy Prescribed Fxs (Delivered / Prescribed): 4 / 4     ==========ON TREATMENT VISIT DATES========== 2024-06-03     ==========UPCOMING VISITS========== 08/09/2024 [facility] ROUTINE FOOT CARE Loreda Hacker, DPM  07/06/2024 [facility] ONC POST TREATMENT CALL [facility]POST TREAT[facility]        ==========APPENDIX - ON TREATMENT VISIT NOTES==========   See weekly On Treatment Notes in Epic for details in the Media tab (listed as Progress notes on the On Treatment Visit Dates listed above).

## 2024-06-08 ENCOUNTER — Telehealth: Payer: Self-pay | Admitting: Physician Assistant

## 2024-06-08 NOTE — Telephone Encounter (Signed)
 Pt's daughter called in to inform us  the pt passed away 07/01/2024

## 2024-07-02 DEATH — deceased

## 2024-07-06 ENCOUNTER — Ambulatory Visit

## 2024-08-09 ENCOUNTER — Ambulatory Visit: Admitting: Podiatry

## 2024-09-24 ENCOUNTER — Ambulatory Visit: Admitting: Physician Assistant
